# Patient Record
Sex: Female | Born: 1945
Health system: Southern US, Community
[De-identification: ages and names within clinical notes are randomized; demographics above are authoritative.]

## PROBLEM LIST (undated history)

## (undated) DIAGNOSIS — I509 Heart failure, unspecified: Secondary | ICD-10-CM

## (undated) DIAGNOSIS — F0390 Unspecified dementia without behavioral disturbance: Secondary | ICD-10-CM

## (undated) DIAGNOSIS — I10 Essential (primary) hypertension: Secondary | ICD-10-CM

## (undated) HISTORY — DX: Essential (primary) hypertension: I10

---

## 2020-05-13 DIAGNOSIS — I639 Cerebral infarction, unspecified: Secondary | ICD-10-CM

## 2020-05-13 HISTORY — DX: Cerebral infarction, unspecified: I63.9

## 2020-05-14 ENCOUNTER — Encounter (HOSPITAL_COMMUNITY): Payer: Self-pay

## 2020-05-14 ENCOUNTER — Inpatient Hospital Stay (HOSPITAL_COMMUNITY)
Admission: EM | Admit: 2020-05-14 | Discharge: 2020-05-18 | DRG: 064 | Disposition: A | Discharge status: Skilled Nursing Facility | Payer: Medicare Other | Attending: Internal Medicine | Admitting: Internal Medicine

## 2020-05-14 ENCOUNTER — Emergency Department (HOSPITAL_COMMUNITY): Payer: Medicare Other

## 2020-05-14 ENCOUNTER — Other Ambulatory Visit: Payer: Self-pay

## 2020-05-14 ENCOUNTER — Inpatient Hospital Stay (HOSPITAL_COMMUNITY): Payer: Medicare Other

## 2020-05-14 DIAGNOSIS — N3000 Acute cystitis without hematuria: Secondary | ICD-10-CM

## 2020-05-14 DIAGNOSIS — I160 Hypertensive urgency: Secondary | ICD-10-CM | POA: Diagnosis present

## 2020-05-14 DIAGNOSIS — I639 Cerebral infarction, unspecified: Secondary | ICD-10-CM | POA: Diagnosis present

## 2020-05-14 DIAGNOSIS — Z6829 Body mass index (BMI) 29.0-29.9, adult: Secondary | ICD-10-CM | POA: Diagnosis not present

## 2020-05-14 DIAGNOSIS — E538 Deficiency of other specified B group vitamins: Secondary | ICD-10-CM | POA: Diagnosis present

## 2020-05-14 DIAGNOSIS — R29708 NIHSS score 8: Secondary | ICD-10-CM | POA: Diagnosis present

## 2020-05-14 DIAGNOSIS — R471 Dysarthria and anarthria: Secondary | ICD-10-CM | POA: Diagnosis present

## 2020-05-14 DIAGNOSIS — R627 Adult failure to thrive: Secondary | ICD-10-CM | POA: Diagnosis not present

## 2020-05-14 DIAGNOSIS — I739 Peripheral vascular disease, unspecified: Secondary | ICD-10-CM | POA: Diagnosis present

## 2020-05-14 DIAGNOSIS — N3001 Acute cystitis with hematuria: Secondary | ICD-10-CM | POA: Diagnosis present

## 2020-05-14 DIAGNOSIS — Z20822 Contact with and (suspected) exposure to covid-19: Secondary | ICD-10-CM | POA: Diagnosis present

## 2020-05-14 DIAGNOSIS — R4182 Altered mental status, unspecified: Secondary | ICD-10-CM | POA: Diagnosis not present

## 2020-05-14 DIAGNOSIS — D72829 Elevated white blood cell count, unspecified: Secondary | ICD-10-CM | POA: Diagnosis present

## 2020-05-14 DIAGNOSIS — D72825 Bandemia: Secondary | ICD-10-CM | POA: Diagnosis not present

## 2020-05-14 DIAGNOSIS — B9620 Unspecified Escherichia coli [E. coli] as the cause of diseases classified elsewhere: Secondary | ICD-10-CM | POA: Diagnosis present

## 2020-05-14 DIAGNOSIS — E118 Type 2 diabetes mellitus with unspecified complications: Secondary | ICD-10-CM | POA: Diagnosis present

## 2020-05-14 DIAGNOSIS — F17210 Nicotine dependence, cigarettes, uncomplicated: Secondary | ICD-10-CM | POA: Diagnosis present

## 2020-05-14 DIAGNOSIS — G9341 Metabolic encephalopathy: Secondary | ICD-10-CM | POA: Diagnosis present

## 2020-05-14 DIAGNOSIS — E785 Hyperlipidemia, unspecified: Secondary | ICD-10-CM | POA: Diagnosis present

## 2020-05-14 DIAGNOSIS — I6340 Cerebral infarction due to embolism of unspecified cerebral artery: Secondary | ICD-10-CM | POA: Diagnosis not present

## 2020-05-14 DIAGNOSIS — F0150 Vascular dementia without behavioral disturbance: Secondary | ICD-10-CM | POA: Diagnosis present

## 2020-05-14 DIAGNOSIS — N179 Acute kidney failure, unspecified: Secondary | ICD-10-CM | POA: Diagnosis present

## 2020-05-14 DIAGNOSIS — I6381 Other cerebral infarction due to occlusion or stenosis of small artery: Principal | ICD-10-CM | POA: Diagnosis present

## 2020-05-14 DIAGNOSIS — I351 Nonrheumatic aortic (valve) insufficiency: Secondary | ICD-10-CM | POA: Diagnosis not present

## 2020-05-14 DIAGNOSIS — N390 Urinary tract infection, site not specified: Secondary | ICD-10-CM | POA: Diagnosis present

## 2020-05-14 LAB — COMPREHENSIVE METABOLIC PANEL
ALT: 13 U/L (ref 0–44)
AST: 25 U/L (ref 15–41)
Albumin: 3.7 g/dL (ref 3.5–5.0)
Alkaline Phosphatase: 114 U/L (ref 38–126)
Anion gap: 9 (ref 5–15)
BUN: 17 mg/dL (ref 8–23)
CO2: 24 mmol/L (ref 22–32)
Calcium: 8.5 mg/dL — ABNORMAL LOW (ref 8.9–10.3)
Chloride: 110 mmol/L (ref 98–111)
Creatinine, Ser: 1.17 mg/dL — ABNORMAL HIGH (ref 0.44–1.00)
GFR calc Af Amer: 53 mL/min — ABNORMAL LOW (ref >60)
GFR calc non Af Amer: 46 mL/min — ABNORMAL LOW (ref >60)
Glucose, Bld: 137 mg/dL — ABNORMAL HIGH (ref 70–99)
Potassium: 3.9 mmol/L (ref 3.5–5.1)
Sodium: 143 mmol/L (ref 135–145)
Total Bilirubin: 0.6 mg/dL (ref 0.3–1.2)
Total Protein: 7 g/dL (ref 6.5–8.1)

## 2020-05-14 LAB — CBC WITH DIFFERENTIAL/PLATELET
Abs Immature Granulocytes: 0.04 10*3/uL (ref 0.00–0.07)
Basophils Absolute: 0.1 10*3/uL (ref 0.0–0.1)
Basophils Relative: 0 %
Eosinophils Absolute: 0 10*3/uL (ref 0.0–0.5)
Eosinophils Relative: 0 %
HCT: 41.2 % (ref 36.0–46.0)
Hemoglobin: 13.5 g/dL (ref 12.0–15.0)
Immature Granulocytes: 0 %
Lymphocytes Relative: 8 %
Lymphs Abs: 1 10*3/uL (ref 0.7–4.0)
MCH: 31.7 pg (ref 26.0–34.0)
MCHC: 32.8 g/dL (ref 30.0–36.0)
MCV: 96.7 fL (ref 80.0–100.0)
Monocytes Absolute: 0.6 10*3/uL (ref 0.1–1.0)
Monocytes Relative: 5 %
Neutro Abs: 10.5 10*3/uL — ABNORMAL HIGH (ref 1.7–7.7)
Neutrophils Relative %: 87 %
Platelets: 269 10*3/uL (ref 150–400)
RBC: 4.26 MIL/uL (ref 3.87–5.11)
RDW: 13 % (ref 11.5–15.5)
WBC: 12.1 10*3/uL — ABNORMAL HIGH (ref 4.0–10.5)
nRBC: 0 % (ref 0.0–0.2)

## 2020-05-14 LAB — SARS CORONAVIRUS 2 BY RT PCR (HOSPITAL ORDER, PERFORMED IN ~~LOC~~ HOSPITAL LAB): SARS Coronavirus 2: NEGATIVE

## 2020-05-14 LAB — URINALYSIS, ROUTINE W REFLEX MICROSCOPIC
Bilirubin Urine: NEGATIVE
Glucose, UA: NEGATIVE mg/dL
Ketones, ur: 20 mg/dL — AB
Nitrite: NEGATIVE
Protein, ur: 100 mg/dL — AB
Specific Gravity, Urine: 1.01 (ref 1.005–1.030)
WBC, UA: >50 WBC/hpf — ABNORMAL HIGH (ref 0–5)
pH: 6 (ref 5.0–8.0)

## 2020-05-14 LAB — TSH: TSH: 2.739 u[IU]/mL (ref 0.350–4.500)

## 2020-05-14 IMAGING — DX DG CHEST 1V PORT
1 series · 1 of 1 positions shown · non-contrast
Comparison: None.

CLINICAL DATA: Weakness.  Confusion.  Cough.

EXAM:
PORTABLE CHEST 1 VIEW

[chest ap]
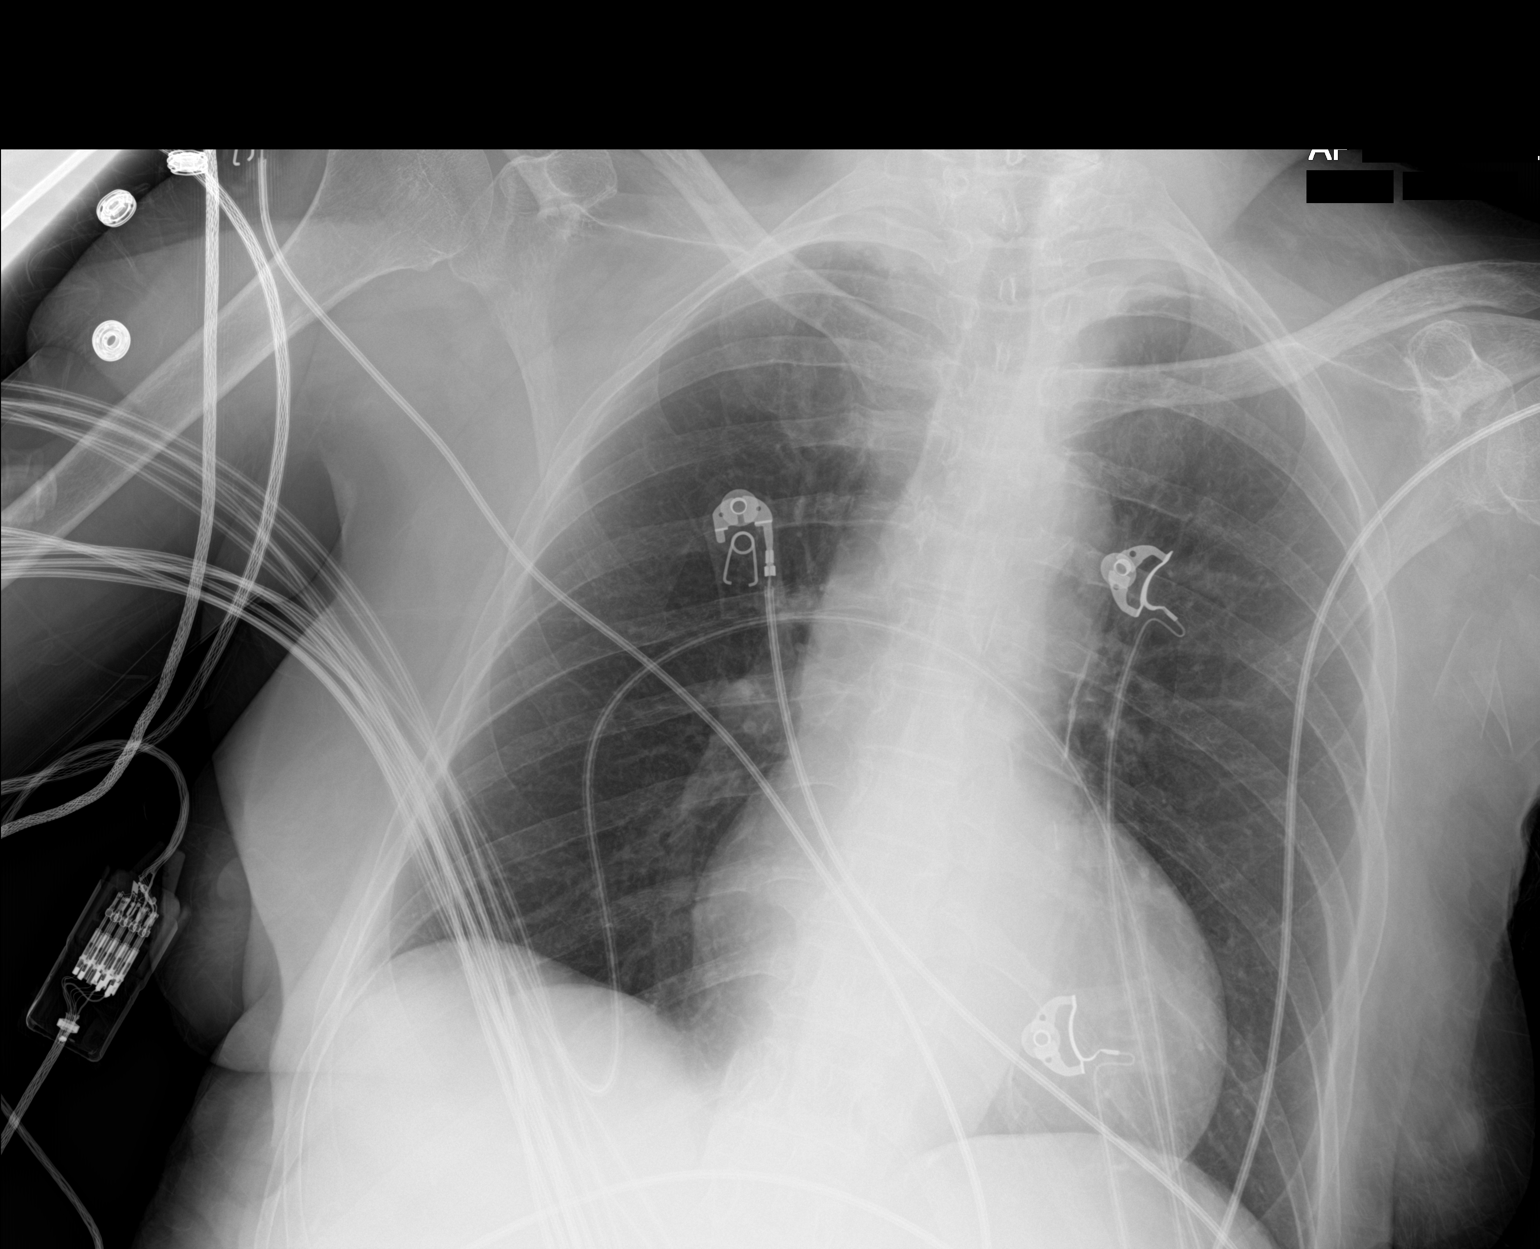

[1 of 1 positions shown; findings below may reference images not displayed]

FINDINGS: Numerous leads and wires project over the chest. Midline trachea.
Borderline cardiomegaly. Aortic atherosclerosis. Left costophrenic
angle minimally excluded. No pleural effusion or pneumothorax. Clear
lungs.
IMPRESSION: No acute cardiopulmonary disease.

## 2020-05-14 IMAGING — MR MR HEAD W/O CM
2 of 6 series · 25 of 48 positions shown · non-contrast
Comparison: None.

CLINICAL DATA: Weakness and decreased mobility

EXAM:
MRI HEAD WITHOUT CONTRAST
TECHNIQUE: Multiplanar, multiecho pulse sequences of the brain and surrounding
structures were obtained without intravenous contrast.

[Series 5: DWI · axial · 3.0mm · 1.36mm/px · z∈[-39,+98]mm · 17 of 100 slices shown (1 of 2)]
[im 1/100]
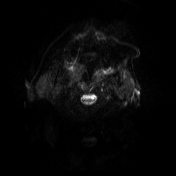
[im 7/100]
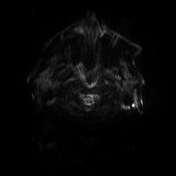
[im 13/100]
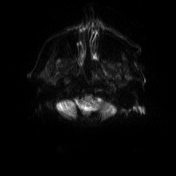
[im 19/100]
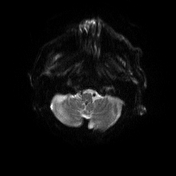
[im 25/100]
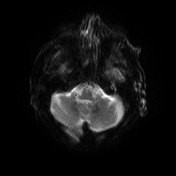
[im 31/100]
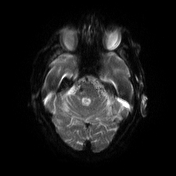
[im 38/100]
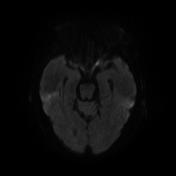
[im 44/100]
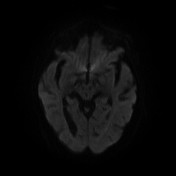
[im 50/100]
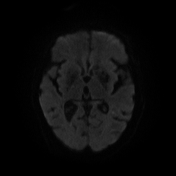
[im 56/100]
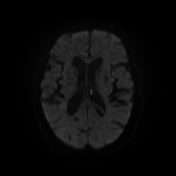
[im 62/100]
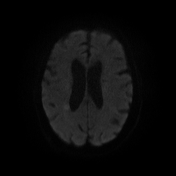
[im 69/100]
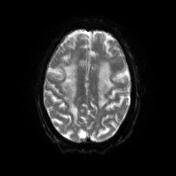
[im 75/100]
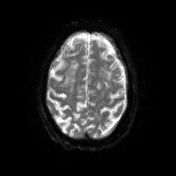
[im 81/100]
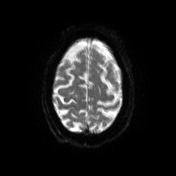
[im 87/100]
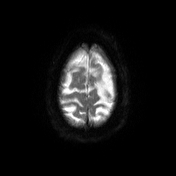
[im 93/100]
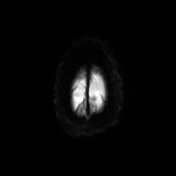
[im 100/100]
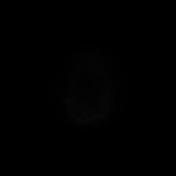

[Series 6: DWI · axial · 3.0mm · 1.36mm/px · z∈[-39,+98]mm · 8 of 50 slices shown (2 of 2)]
[im 1/50]
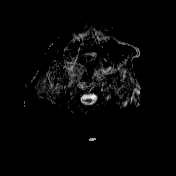
[im 8/50]
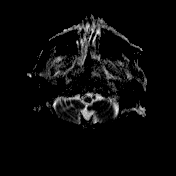
[im 15/50]
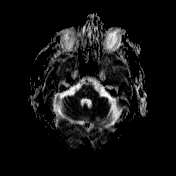
[im 22/50]
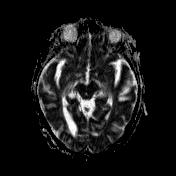
[im 29/50]
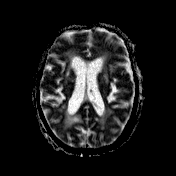
[im 36/50]
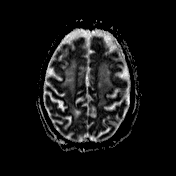
[im 43/50]
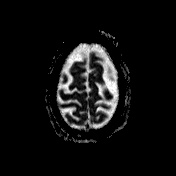
[im 50/50]
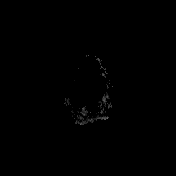

[25 of 48 positions shown; findings below may reference images not displayed]

FINDINGS: The examination was discontinued prior to completion due to patient
altered mental status and inability to cooperate with the
technologist's instructions. Axial diffusion-weighted imaging only
was obtained.

There are multiple small areas of hyperintensity on
diffusion-weighted imaging, including the right frontal white
matter, right periatrial white matter and left temporal lobe. No
midline shift or other mass effect.
IMPRESSION: 1. Truncated examination due to patient altered mental status and
inability to cooperate with the technologist's instructions.
2. Multiple small areas of abnormal diffusivity within the right
frontal white matter, right periatrial white matter and left
temporal lobe. These are most likely small infarcts, possibly from
an embolic source.

## 2020-05-14 IMAGING — CT CT HEAD W/O CM
3 series · 15 of 47 positions shown, 18 images · non-contrast
Comparison: No pertinent prior exams are available for comparison.

CLINICAL DATA: Delirium. Additional provided: Increasing
generalized weakness and reduced mobility for several months

EXAM:
CT HEAD WITHOUT CONTRAST
TECHNIQUE: Contiguous axial images were obtained from the base of the skull
through the vertex without intravenous contrast.

[Series 2: head wo · axial · 0.39mm/px · z∈[-148,-13]mm · 9 of 33 slices shown, 12 images]
[im 3/33  brain]
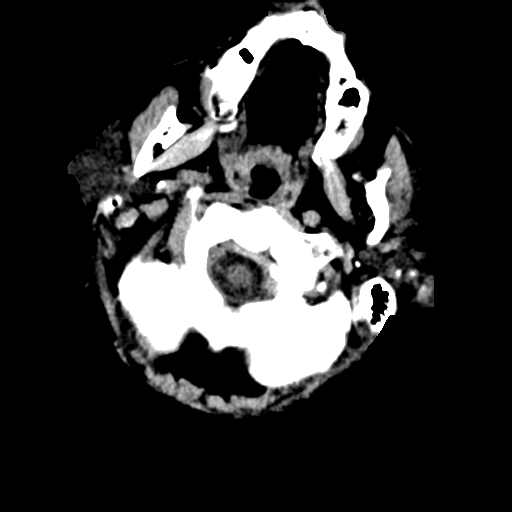
[im 3/33  bone]
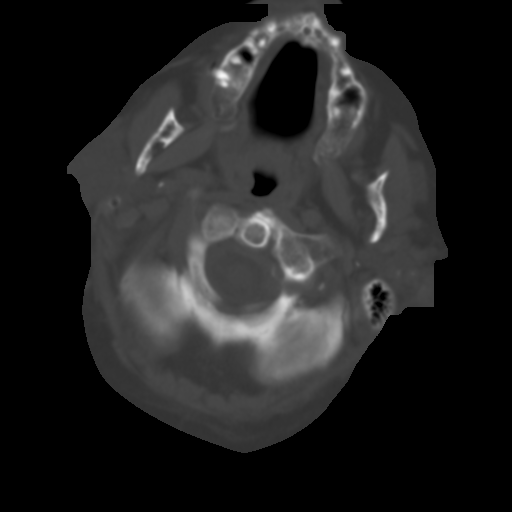
[im 6/33  brain]
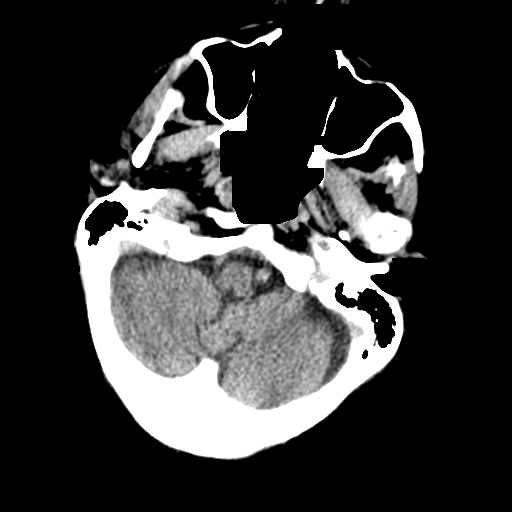
[im 9/33  brain]
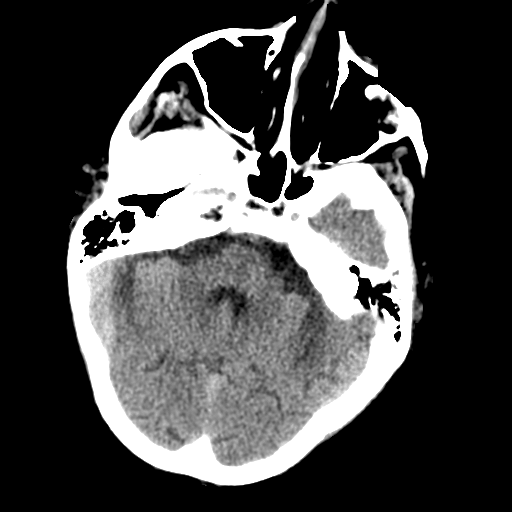
[im 13/33  brain]
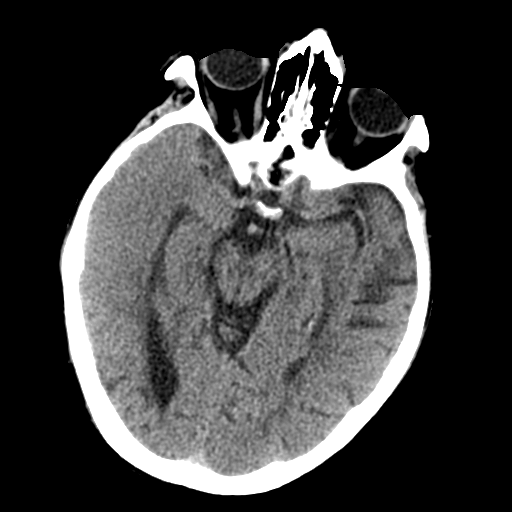
[im 17/33  brain]
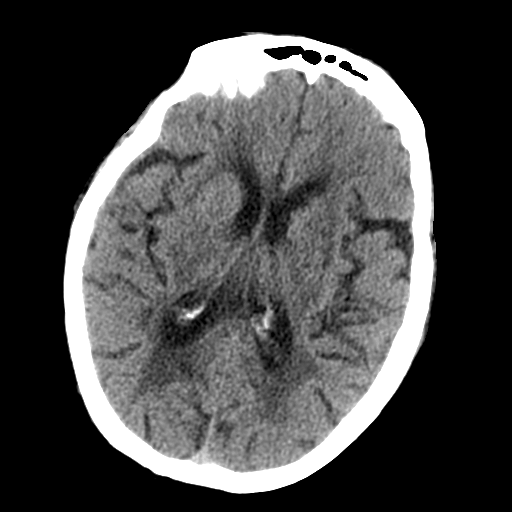
[im 17/33  bone]
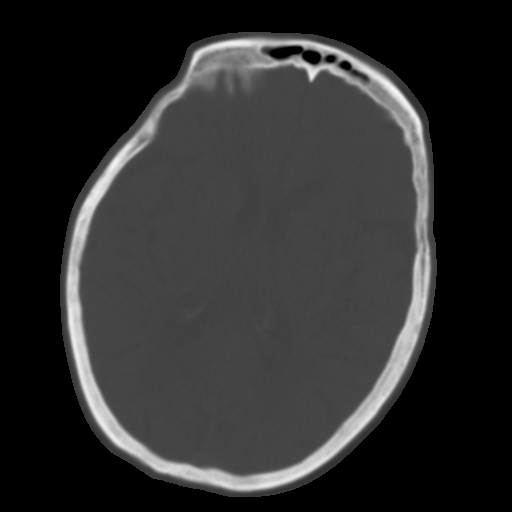
[im 20/33  brain]
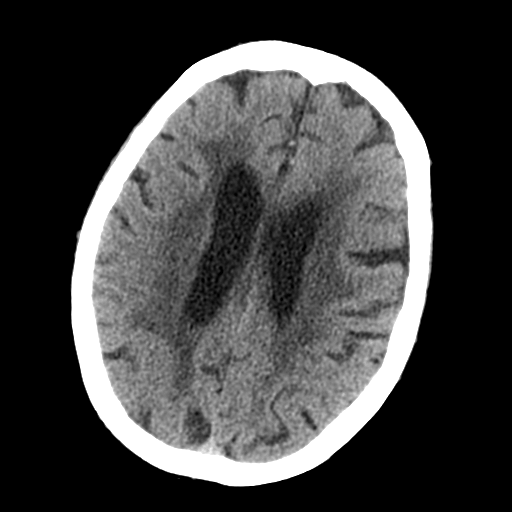
[im 24/33  brain]
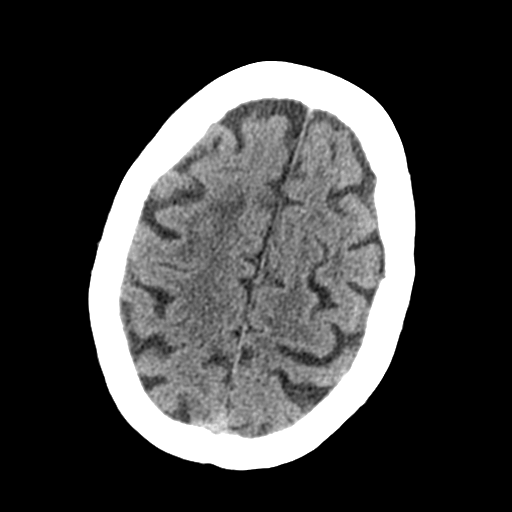
[im 27/33  brain]
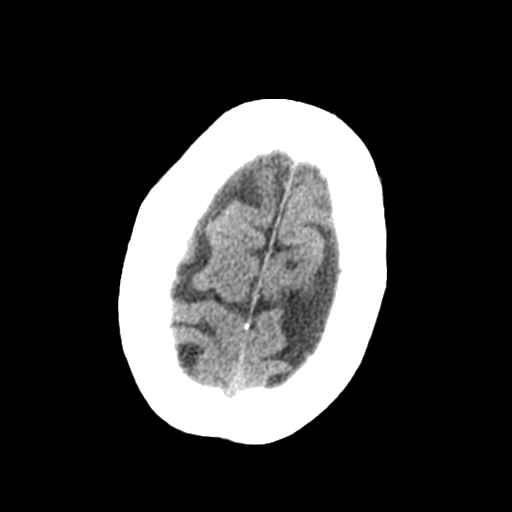
[im 30/33  brain]
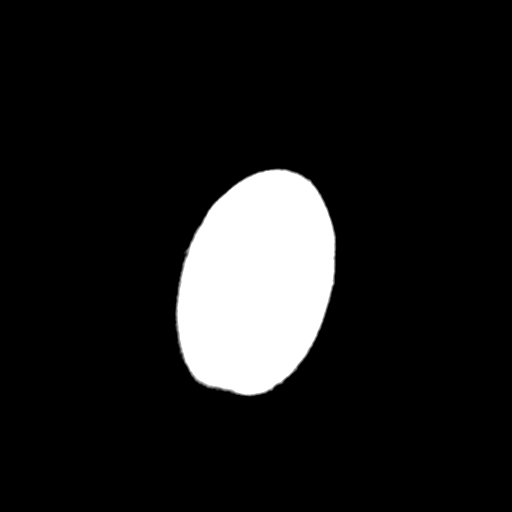
[im 30/33  bone]
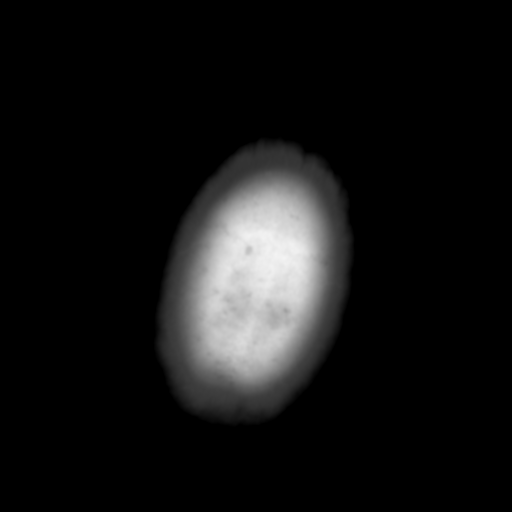

[Series 5: coronal soft tissue · coronal · 0.29mm/px · 3 of 68 slices shown]
[im 26/68  brain]
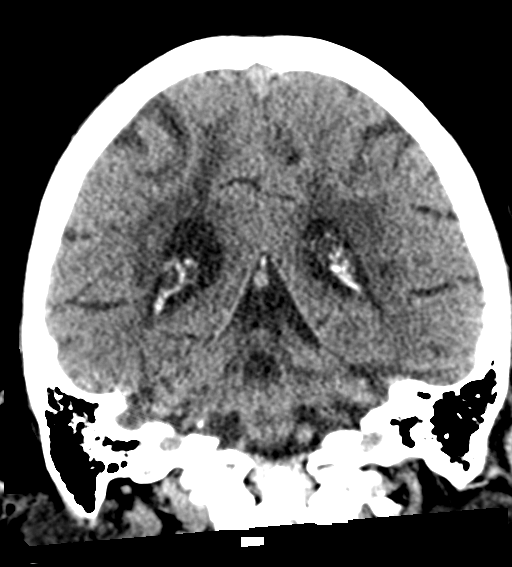
[im 31/68  brain]
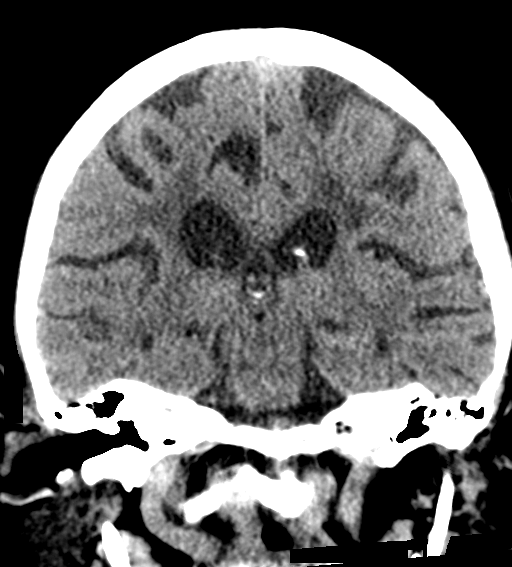
[im 37/68  brain]
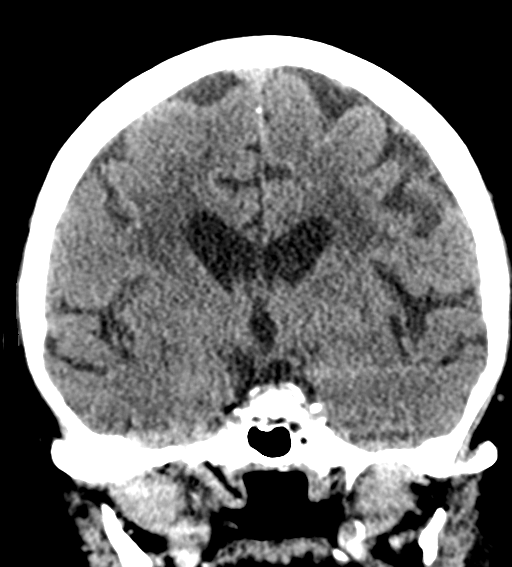

[Series 6: sagittal soft tissue · sagittal · 0.32mm/px · 3 of 50 slices shown]
[im 17/50  brain]
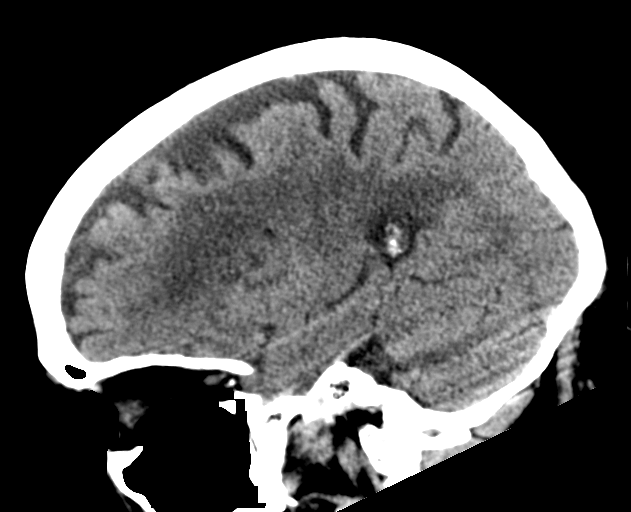
[im 25/50  brain]
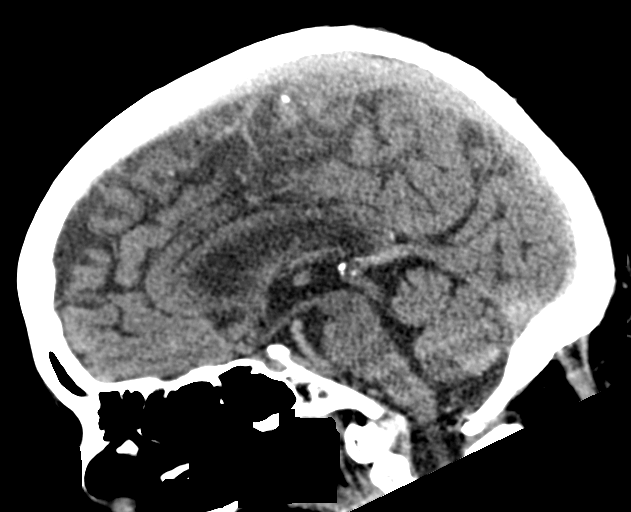
[im 33/50  brain]
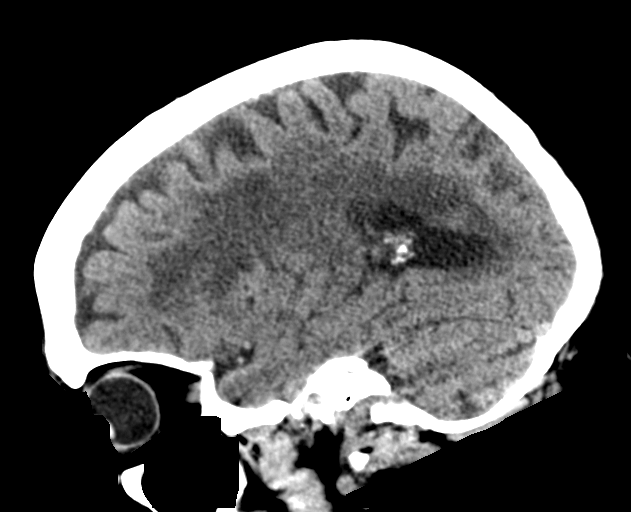

[15 of 47 positions shown; findings below may reference images not displayed]

FINDINGS: Brain:

Moderate generalized parenchymal atrophy.

Advanced ill-defined hypoattenuation within the cerebral white
matter is nonspecific, but consistent with chronic small vessel
ischemic disease.

There are multiple lacunar infarcts within the bilateral basal
ganglia, some of which are clearly chronic and some of which are
small and age-indeterminate. Small chronic appearing lacunar infarct
within the right thalamus. Small chronic appearing infarct within
the right cerebellum.

Small chronic cortically based infarct within the paramedian right
parietal lobe.

No demarcated cortical infarct.

No extra-axial fluid collection.

No evidence of intracranial mass.

No midline shift.

Vascular: No hyperdense vessel.  Atherosclerotic calcifications

Skull: Normal. Negative for fracture or focal lesion.

Sinuses/Orbits: Visualized orbits show no acute finding. Trace
ethmoid sinus mucosal thickening. No significant mastoid effusion
IMPRESSION: No evidence of acute intracranial hemorrhage or acute demarcated
cortical infarct.

There are multiple lacunar infarcts within the bilateral basal
ganglia, some of which are clearly chronic and some of which are
small and age-indeterminate.

Small chronic appearing infarcts are also present within the right
thalamus and right cerebellar hemisphere.

Background moderate generalized parenchymal atrophy and advanced
cerebral white matter chronic small vessel ischemic disease.

Small chronic cortically based infarct within the paramedian right
parietal lobe.

Minimal ethmoid sinus mucosal thickening.

## 2020-05-14 MED ORDER — LABETALOL HCL 5 MG/ML IV SOLN
5.0000 mg | INTRAVENOUS | Status: DC | PRN
  Administered 2020-05-14 – 2020-05-17 (×2): 5 mg via INTRAVENOUS
  Filled 2020-05-14 (×2): qty 4

## 2020-05-14 MED ORDER — ENOXAPARIN SODIUM 40 MG/0.4ML ~~LOC~~ SOLN
40.0000 mg | SUBCUTANEOUS | Status: DC
  Administered 2020-05-14 – 2020-05-17 (×4): 40 mg via SUBCUTANEOUS
  Filled 2020-05-14 (×4): qty 0.4

## 2020-05-14 MED ORDER — DEXTROSE-NACL 5-0.45 % IV SOLN: INTRAVENOUS | Status: DC

## 2020-05-14 MED ORDER — SODIUM CHLORIDE 0.9 % IV SOLN
1.0000 g | INTRAVENOUS | Status: DC
  Administered 2020-05-15 – 2020-05-16 (×2): 1 g via INTRAVENOUS
  Filled 2020-05-14 (×2): qty 10

## 2020-05-14 MED ORDER — CLONIDINE HCL 0.1 MG PO TABS
0.1000 mg | ORAL_TABLET | Freq: Three times a day (TID) | ORAL | Status: DC
  Administered 2020-05-14 – 2020-05-18 (×11): 0.1 mg via ORAL
  Filled 2020-05-14 (×11): qty 1

## 2020-05-14 MED ORDER — SODIUM CHLORIDE 0.9 % IV SOLN
1.0000 g | Freq: Once | INTRAVENOUS | Status: AC
  Administered 2020-05-14: 1 g via INTRAVENOUS
  Filled 2020-05-14: qty 10

## 2020-05-14 MED ORDER — ACETAMINOPHEN 325 MG PO TABS: 650.0000 mg | ORAL_TABLET | Freq: Four times a day (QID) | ORAL | Status: DC | PRN

## 2020-05-14 MED ORDER — ONDANSETRON HCL 4 MG PO TABS: 4.0000 mg | ORAL_TABLET | Freq: Four times a day (QID) | ORAL | Status: DC | PRN

## 2020-05-14 MED ORDER — ACETAMINOPHEN 650 MG RE SUPP: 650.0000 mg | Freq: Four times a day (QID) | RECTAL | Status: DC | PRN

## 2020-05-14 MED ORDER — ONDANSETRON HCL 4 MG/2ML IJ SOLN: 4.0000 mg | Freq: Four times a day (QID) | INTRAMUSCULAR | Status: DC | PRN

## 2020-05-14 NOTE — ED Triage Notes (Signed)
EMS reports from home, family called out for increasing generalized weakness and reduced mobility x several months. Has not seen any provider in 25 years per family. Pt denies pain or distress. Family states Pt is non compliant with assisted ADLs.  BP 220/108 HR 92 RR 18 Sp02 96 RA CBG 161 Temp 97.4  20 LAC

## 2020-05-14 NOTE — ED Provider Notes (Signed)
[facility] DEPT Provider Note   CSN: [id number] Arrival date & time: 05/14/20  1512     History Chief Complaint  Patient presents with  . Weakness  . Failure To Thrive    Melinda Olson is a 74 y.o. female with no pertinent past medical history that presents via EMS for weakness and failure to thrive.  Per EMS family called due to generalized weakness and reduced mobility for at least 7 months now.  Has not seen provider in over 25 years.  When speaking to patient she states that she is not sure why she is here, states that she came because her husband made her.  Denies any pain anywhere.  Denies any medications that she takes daily.  States that she feels fine and is ready to go home.  Denies any dysuria, chest pain, shortness of breath, vomiting, nausea, vomiting, diarrhea.  Patient states that she is been eating and drinking going to the bathroom normally.  States that she lives at home with her husband who takes care of her.  No medical history in the record, unable to access care everywhere.  Was able to speak to son and husband, they state that she has been declining for several months now however for the past 3 days she has had been unable to walk.  Normally uses a walker at home, however for the past 3 days has not been able to use it.  No falls, seizure-like activity, neglect, facial droop or drooling.  Worsening confusion over the Past week.  HPI     History reviewed. No pertinent past medical history.  There are no problems to display for this patient.   History reviewed. No pertinent surgical history.   OB History   No obstetric history on file.     History reviewed. No pertinent family history.  Social History   Tobacco Use  . Smoking status: Heavy Tobacco Smoker  . Smokeless tobacco: Never Used  Substance Use Topics  . Alcohol use: Not on file  . Drug use: Not on file    Home Medications Prior to Admission medications   Not  on File    Allergies    Patient has no known allergies.  Review of Systems   Review of Systems  Constitutional: Negative for chills, diaphoresis, fatigue and fever.  HENT: Negative for congestion, sore throat and trouble swallowing.   Eyes: Negative for pain and visual disturbance.  Respiratory: Negative for cough, shortness of breath and wheezing.   Cardiovascular: Negative for chest pain, palpitations and leg swelling.  Gastrointestinal: Negative for abdominal distention, abdominal pain, diarrhea, nausea and vomiting.  Genitourinary: Negative for difficulty urinating.  Musculoskeletal: Negative for back pain, neck pain and neck stiffness.  Skin: Negative for pallor.  Neurological: Positive for weakness. Negative for dizziness, speech difficulty and headaches.  Psychiatric/Behavioral: Negative for confusion.    Physical Exam Updated Vital Signs BP (!) 202/90   Pulse 95   Temp 98.5 F (36.9 C) (Oral)   Resp 16   SpO2 98%   Physical Exam Constitutional:      General: She is not in acute distress.    Appearance: Normal appearance. She is not ill-appearing, toxic-appearing or diaphoretic.     Comments: When patient arrived patient was soiled, appears as if she has been soiled for multiple hours/days.  HENT:     Head: Normocephalic and atraumatic.     Mouth/Throat:     Mouth: Mucous membranes are moist.  Pharynx: Oropharynx is clear.  Eyes:     General: No scleral icterus.    Extraocular Movements: Extraocular movements intact.     Pupils: Pupils are equal, round, and reactive to light.  Cardiovascular:     Rate and Rhythm: Normal rate and regular rhythm.     Pulses: Normal pulses.     Heart sounds: Normal heart sounds.  Pulmonary:     Effort: Pulmonary effort is normal. No respiratory distress.     Breath sounds: Normal breath sounds. No stridor. No wheezing, rhonchi or rales.  Chest:     Chest wall: No tenderness.  Abdominal:     General: Abdomen is flat. There  is no distension.     Palpations: Abdomen is soft.     Tenderness: There is no abdominal tenderness. There is no guarding or rebound.  Musculoskeletal:        General: No swelling or tenderness. Normal range of motion.     Cervical back: Normal range of motion and neck supple. No rigidity or tenderness.     Right lower leg: No edema.     Left lower leg: No edema.     Comments: Normal strength to upper and lower extremities, will not answer me about sensation.  Radial pulse 2+ and equal.  PT pulses 2+.  Skin:    General: Skin is warm and dry.     Capillary Refill: Capillary refill takes less than 2 seconds.     Coloration: Skin is not jaundiced or pale.     Findings: No bruising.  Neurological:     General: No focal deficit present.     Mental Status: She is alert and oriented to person, place, and time.     Comments: Will follow some commands, is delayed.  Alert to self, not alert to place, time, situation.. Clear speech. No facial droop. CNIII-XII grossly intact. Bilateral upper and lower extremities' sensation grossly intact. 5/5 symmetric strength with grip strength and with plantar and dorsi flexion bilaterally.   Patient will answer some questions, however will not answer other questions.  Appears confused at times, however will follow commands and answer most questions.   Psychiatric:        Mood and Affect: Mood normal.        Behavior: Behavior normal.     ED Results / Procedures / Treatments   Labs (all labs ordered are listed, but only abnormal results are displayed) Labs Reviewed  URINALYSIS, ROUTINE W REFLEX MICROSCOPIC - Abnormal; Notable for the following components:      Result Value   Color, Urine STRAW (*)    APPearance TURBID (*)    Hgb urine dipstick SMALL (*)    Ketones, ur 20 (*)    Protein, ur 100 (*)    Leukocytes,Ua LARGE (*)    WBC, UA >50 (*)    Bacteria, UA MANY (*)    Non Squamous Epithelial 6-10 (*)    All other components within normal limits    COMPREHENSIVE METABOLIC PANEL - Abnormal; Notable for the following components:   Glucose, Bld 137 (*)    Creatinine, Ser 1.17 (*)    Calcium 8.5 (*)    GFR calc non Af Amer 46 (*)    GFR calc Af Amer 53 (*)    All other components within normal limits  CBC WITH DIFFERENTIAL/PLATELET - Abnormal; Notable for the following components:   WBC 12.1 (*)    Neutro Abs 10.5 (*)    All other  components within normal limits  URINE CULTURE  TSH    EKG None  Radiology CT Head Wo Contrast  Result Date: 05/14/2020 CLINICAL DATA:  Delirium. Additional provided: Increasing generalized weakness and reduced mobility for several months EXAM: CT HEAD WITHOUT CONTRAST TECHNIQUE: Contiguous axial images were obtained from the base of the skull through the vertex without intravenous contrast. COMPARISON:  No pertinent prior exams are available for comparison. FINDINGS: Brain: Moderate generalized parenchymal atrophy. Advanced ill-defined hypoattenuation within the cerebral white matter is nonspecific, but consistent with chronic small vessel ischemic disease. There are multiple lacunar infarcts within the bilateral basal ganglia, some of which are clearly chronic and some of which are small and age-indeterminate. Small chronic appearing lacunar infarct within the right thalamus. Small chronic appearing infarct within the right cerebellum. Small chronic cortically based infarct within the paramedian right parietal lobe. No demarcated cortical infarct. No extra-axial fluid collection. No evidence of intracranial mass. No midline shift. Vascular: No hyperdense vessel.  Atherosclerotic calcifications Skull: Normal. Negative for fracture or focal lesion. Sinuses/Orbits: Visualized orbits show no acute finding. Trace ethmoid sinus mucosal thickening. No significant mastoid effusion IMPRESSION: No evidence of acute intracranial hemorrhage or acute demarcated cortical infarct. There are multiple lacunar infarcts within the  bilateral basal ganglia, some of which are clearly chronic and some of which are small and age-indeterminate. Small chronic appearing infarcts are also present within the right thalamus and right cerebellar hemisphere. Background moderate generalized parenchymal atrophy and advanced cerebral white matter chronic small vessel ischemic disease. Small chronic cortically based infarct within the paramedian right parietal lobe. Minimal ethmoid sinus mucosal thickening. Electronically Signed   By: Kellie Simmering DO   On: 05/14/2020 17:32   DG Chest Port 1 View  Result Date: 05/14/2020 CLINICAL DATA:  Weakness.  Confusion.  Cough. EXAM: PORTABLE CHEST 1 VIEW COMPARISON:  None. FINDINGS: Numerous leads and wires project over the chest. Midline trachea. Borderline cardiomegaly. Aortic atherosclerosis. Left costophrenic angle minimally excluded. No pleural effusion or pneumothorax. Clear lungs. IMPRESSION: No acute cardiopulmonary disease. Electronically Signed   By: Abigail Miyamoto M.D.   On: 05/14/2020 17:24    Procedures Procedures (including critical care time)  Medications Ordered in ED Medications  cefTRIAXone (ROCEPHIN) 1 g in sodium chloride 0.9 % 100 mL IVPB (has no administration in time range)    ED Course  I have reviewed the triage vital signs and the nursing notes.  Pertinent labs & imaging results that were available during my care of the patient were reviewed by me and considered in my medical decision making (see chart for details).    MDM Rules/Calculators/A&P                         Aiya Keach is a 74 y.o. female with no pertinent past medical history that presents via EMS for weakness and failure to thrive.   Their exam with confusion, only alert to self.  Globally weak.  Unable to assess gait due to weakness.  Will obtain normal labs, CT head, and urine.  CBC and CMP unremarkable.  CT head with chronic and indeterminate age lacunar infarcts, since patient has been unable to walk  for the past 3 days with age indeterminate infarcts will consult hospitalist this time for stroke work-up.  719 spoke to neurology, Dr. Leonel Ramsay who does not recomend urgent transfer to [facility], recommends MRI.  UA came back positive for UTI.  Will treat at this time.  Spoke to Dr. Jonelle Sidle who will accept patient.  This could most likely explain patient's confusion.  The patient appears reasonably stabilized for admission considering the current resources, flow, and capabilities available in the ED at this time, and I doubt any other [facility] requiring further screening and/or treatment in the ED prior to admission.  I discussed this case with my attending physician who cosigned this note including patient's presenting symptoms, physical exam, and planned diagnostics and interventions. Attending physician stated agreement with plan or made changes to plan which were implemented.   Attending physician assessed patient at bedside.  Final Clinical Impression(s) / ED Diagnoses Final diagnoses:  Failure to thrive in adult  Acute cystitis with hematuria    Rx / DC Orders ED Discharge Orders    None       Alfredia Client, PA-C 05/14/20 Malachi Carl, MD 05/14/20 [id number]

## 2020-05-14 NOTE — H&P (Signed)
History and Physical   Melinda Olson ZOX:[id number] DOB: Jul 28, 1946 DOA: 05/14/2020  Referring MD/NP/PA: Dr. Alvino Chapel  PCP: Patient, No Pcp Per   Outpatient Specialists: None  Patient coming from: Home  Chief Complaint: Generalized weakness and failure to thrive  HPI: Melinda Olson is a 74 y.o. female with medical history significant of no significant past medical history who was brought in by EMS due to generalized weakness over the last 3 days.  Patient apparently has been declining in the last few months but got worse in the last 3 days to where she could not get out of bed.  She was previously seen a doctor over 4 years has not.  No documented medical issues.  Patient was observed to have systolic blood pressure in the 200s in the ER.  Initial CT suggested possible infarct and urinalysis indicated UTI.  She was noted to have her decline in her cognition also.  She was not coherent.  Family were worried that is why they brought her to the ER.  She was reviewed by neurology who thinks this is not a stroke.  Recommends treat treatment and evaluation for metabolic encephalopathy.  At this point she more than likely has UTI..  ED Course: Temperature is 98.5 blood pressure 223/88 pulse 110 respiratory 22 oxygen sats 96% on room air.  Urinalysis showed turbid urine with large leukocyte WBC more than 50 RBC 0-5 many bacteria.  Urine cultures obtained.  Head CT without contrast showed no evidence of acute intracranial hemorrhage multiple lacunar infarct with the bilateral basal ganglia mostly of them chronic.  Small chronic infarct within the paramedian right parietal lobe.  Recommendations for MRI done.  Chemistry showed glucose of 137 otherwise uneventful White count 12.1 the rest of the CBC within normal.  Patient is being admitted for further evaluation and treatment.  Review of Systems: As per HPI otherwise 10 point review of systems negative.    History reviewed. No pertinent past medical  history.  History reviewed. No pertinent surgical history.   reports that she has been smoking cigarettes. She has never used smokeless tobacco. She reports that she does not drink alcohol and does not use drugs.  No Known Allergies  History reviewed. No pertinent family history.   Prior to Admission medications   Not on File    Physical Exam: Vitals:   05/14/20 1547 05/14/20 1809 05/14/20 1900 05/14/20 2000  BP: (!) 153/106 (!) 213/99 (!) 202/90 (!) 219/94  Pulse: 90 (!) 102 95 96  Resp: 17 17 16  (!) 22  Temp: 98.5 F (36.9 C)     TempSrc: Oral     SpO2: 98% 98% 98% 98%      Constitutional: Confused, laying in bed, no distress Vitals:   05/14/20 1547 05/14/20 1809 05/14/20 1900 05/14/20 2000  BP: (!) 153/106 (!) 213/99 (!) 202/90 (!) 219/94  Pulse: 90 (!) 102 95 96  Resp: 17 17 16  (!) 22  Temp: 98.5 F (36.9 C)     TempSrc: Oral     SpO2: 98% 98% 98% 98%   Eyes: PERRL, lids and conjunctivae normal ENMT: Mucous membranes are moist. Posterior pharynx clear of any exudate or lesions.Normal dentition.  Neck: normal, supple, no masses, no thyromegaly Respiratory: clear to auscultation bilaterally, no wheezing, no crackles. Normal respiratory effort. No accessory muscle use.  Cardiovascular: Regular rate and rhythm, no murmurs / rubs / gallops. No extremity edema. 2+ pedal pulses. No carotid bruits.  Abdomen: no tenderness, no masses palpated. No  hepatosplenomegaly. Bowel sounds positive.  Musculoskeletal: no clubbing / cyanosis. No joint deformity upper and lower extremities. Good ROM, no contractures. Normal muscle tone.  Skin: no rashes, lesions, ulcers. No induration Neurologic: CN 2-12 grossly intact. Sensation intact, DTR normal. Strength 5/5 in all 4.  Psychiatric: Confused, disoriented in place person and time    Labs on Admission: I have personally reviewed following labs and imaging studies  CBC: Recent Labs  Lab 05/14/20 1605  WBC 12.1*  NEUTROABS  10.5*  HGB 13.5  HCT 41.2  MCV 96.7  PLT 096   Basic Metabolic Panel: Recent Labs  Lab 05/14/20 1605  NA 143  K 3.9  CL 110  CO2 24  GLUCOSE 137*  BUN 17  CREATININE 1.17*  CALCIUM 8.5*   GFR: CrCl cannot be calculated (Unknown ideal weight.). Liver Function Tests: Recent Labs  Lab 05/14/20 1605  AST 25  ALT 13  ALKPHOS 114  BILITOT 0.6  PROT 7.0  ALBUMIN 3.7   No results for input(s): LIPASE, AMYLASE in the last 168 hours. No results for input(s): AMMONIA in the last 168 hours. Coagulation Profile: No results for input(s): INR, PROTIME in the last 168 hours. Cardiac Enzymes: No results for input(s): CKTOTAL, CKMB, CKMBINDEX, TROPONINI in the last 168 hours. BNP (last 3 results) No results for input(s): PROBNP in the last 8760 hours. HbA1C: No results for input(s): HGBA1C in the last 72 hours. CBG: No results for input(s): GLUCAP in the last 168 hours. Lipid Profile: No results for input(s): CHOL, HDL, LDLCALC, TRIG, CHOLHDL, LDLDIRECT in the last 72 hours. Thyroid Function Tests: Recent Labs    05/14/20 1658  TSH 2.739   Anemia Panel: No results for input(s): VITAMINB12, FOLATE, FERRITIN, TIBC, IRON, RETICCTPCT in the last 72 hours. Urine analysis:    Component Value Date/Time   COLORURINE STRAW (A) 05/14/2020 1800   APPEARANCEUR TURBID (A) 05/14/2020 1800   LABSPEC 1.010 05/14/2020 1800   PHURINE 6.0 05/14/2020 1800   GLUCOSEU NEGATIVE 05/14/2020 1800   HGBUR SMALL (A) 05/14/2020 1800   BILIRUBINUR NEGATIVE 05/14/2020 1800   KETONESUR 20 (A) 05/14/2020 1800   PROTEINUR 100 (A) 05/14/2020 1800   NITRITE NEGATIVE 05/14/2020 1800   LEUKOCYTESUR LARGE (A) 05/14/2020 1800   Sepsis Labs: @LABRCNTIP (procalcitonin:4,lacticidven:4) )No results found for this or any previous visit (from the past 240 hour(s)).   Radiological Exams on Admission: CT Head Wo Contrast  Result Date: 05/14/2020 CLINICAL DATA:  Delirium. Additional provided: Increasing  generalized weakness and reduced mobility for several months EXAM: CT HEAD WITHOUT CONTRAST TECHNIQUE: Contiguous axial images were obtained from the base of the skull through the vertex without intravenous contrast. COMPARISON:  No pertinent prior exams are available for comparison. FINDINGS: Brain: Moderate generalized parenchymal atrophy. Advanced ill-defined hypoattenuation within the cerebral white matter is nonspecific, but consistent with chronic small vessel ischemic disease. There are multiple lacunar infarcts within the bilateral basal ganglia, some of which are clearly chronic and some of which are small and age-indeterminate. Small chronic appearing lacunar infarct within the right thalamus. Small chronic appearing infarct within the right cerebellum. Small chronic cortically based infarct within the paramedian right parietal lobe. No demarcated cortical infarct. No extra-axial fluid collection. No evidence of intracranial mass. No midline shift. Vascular: No hyperdense vessel.  Atherosclerotic calcifications Skull: Normal. Negative for fracture or focal lesion. Sinuses/Orbits: Visualized orbits show no acute finding. Trace ethmoid sinus mucosal thickening. No significant mastoid effusion IMPRESSION: No evidence of acute intracranial hemorrhage or acute demarcated  cortical infarct. There are multiple lacunar infarcts within the bilateral basal ganglia, some of which are clearly chronic and some of which are small and age-indeterminate. Small chronic appearing infarcts are also present within the right thalamus and right cerebellar hemisphere. Background moderate generalized parenchymal atrophy and advanced cerebral white matter chronic small vessel ischemic disease. Small chronic cortically based infarct within the paramedian right parietal lobe. Minimal ethmoid sinus mucosal thickening. Electronically Signed   By: Kellie Simmering DO   On: 05/14/2020 17:32   DG Chest Port 1 View  Result Date:  05/14/2020 CLINICAL DATA:  Weakness.  Confusion.  Cough. EXAM: PORTABLE CHEST 1 VIEW COMPARISON:  None. FINDINGS: Numerous leads and wires project over the chest. Midline trachea. Borderline cardiomegaly. Aortic atherosclerosis. Left costophrenic angle minimally excluded. No pleural effusion or pneumothorax. Clear lungs. IMPRESSION: No acute cardiopulmonary disease. Electronically Signed   By: Abigail Miyamoto M.D.   On: 05/14/2020 17:24    EKG: Independently reviewed.  Showed sinus rhythm with LVH by voltage criteria, also LAD and some nonspecific ST changes  Assessment/Plan Principal Problem:   AMS (altered mental status) Active Problems:   UTI (urinary tract infection)   Leucocytosis   AKI (acute kidney injury) ([place])     #1 altered mental status: Most likely secondary to metabolic encephalopathy.  CVA can still not be ruled out especially with previous multiple lacunar infarcts and even previous CVA.  Patient to be admitted.  MRI of the brain is now showing multiple small areas of CVA.  Treat UTI.  #2 Acute CVA: On MRI.  Transfer patient to [facility].  Follow the stroke pathway now.  #3 UTI: May be responsible for patient's confusion.  Continue empiric Rocephin follow culture results.  #4 hypertensive urgency: Patient has not been on any blood pressure medications prior to admission.  We may need to do permissive hypertension now that she has got a new CVA.  #5 acute kidney injury: Gentle hydration.    DVT prophylaxis: Lovenox Code Status: Full code Family Communication: Family over the phone Disposition Plan: To be determined Consults called: Dr. Leonel Ramsay, neurology Admission status: Inpatient  Severity of Illness: The appropriate patient status for this patient is INPATIENT. Inpatient status is judged to be reasonable and necessary in order to provide the required intensity of service to ensure the patient's safety. The patient's presenting symptoms, physical exam findings,  and initial radiographic and laboratory data in the context of their chronic comorbidities is felt to place them at high risk for further clinical deterioration. Furthermore, it is not anticipated that the patient will be medically stable for discharge from the hospital within 2 midnights of admission. The following factors support the patient status of inpatient.   " The patient's presenting symptoms include confusion. " The worrisome physical exam findings include altered mental status. " The initial radiographic and laboratory data are worrisome because of evidence of UTI but also multiple small CVAs. " The chronic co-morbidities include unknown.   * I certify that at the point of admission it is my clinical judgment that the patient will require inpatient hospital care spanning beyond 2 midnights from the point of admission due to high intensity of service, high risk for further deterioration and high frequency of surveillance required.Barbette Merino MD [facility] Pager [phone]  If 7PM-7AM, please contact night-coverage [email or web address] Password [email or web address]  05/14/2020, 8:21 PM

## 2020-05-15 ENCOUNTER — Inpatient Hospital Stay (HOSPITAL_COMMUNITY): Payer: Medicare Other

## 2020-05-15 DIAGNOSIS — I6340 Cerebral infarction due to embolism of unspecified cerebral artery: Secondary | ICD-10-CM

## 2020-05-15 DIAGNOSIS — R4182 Altered mental status, unspecified: Secondary | ICD-10-CM

## 2020-05-15 DIAGNOSIS — I639 Cerebral infarction, unspecified: Secondary | ICD-10-CM | POA: Diagnosis present

## 2020-05-15 DIAGNOSIS — I351 Nonrheumatic aortic (valve) insufficiency: Secondary | ICD-10-CM

## 2020-05-15 LAB — GLUCOSE, CAPILLARY
Glucose-Capillary: 145 mg/dL — ABNORMAL HIGH (ref 70–99)
Glucose-Capillary: 139 mg/dL — ABNORMAL HIGH (ref 70–99)

## 2020-05-15 LAB — COMPREHENSIVE METABOLIC PANEL
ALT: 10 U/L (ref 0–44)
AST: 34 U/L (ref 15–41)
Albumin: 3.5 g/dL (ref 3.5–5.0)
Alkaline Phosphatase: 100 U/L (ref 38–126)
Anion gap: 14 (ref 5–15)
BUN: 20 mg/dL (ref 8–23)
CO2: 18 mmol/L — ABNORMAL LOW (ref 22–32)
Calcium: 8.5 mg/dL — ABNORMAL LOW (ref 8.9–10.3)
Chloride: 106 mmol/L (ref 98–111)
Creatinine, Ser: 1.25 mg/dL — ABNORMAL HIGH (ref 0.44–1.00)
GFR calc Af Amer: 49 mL/min — ABNORMAL LOW (ref >60)
GFR calc non Af Amer: 42 mL/min — ABNORMAL LOW (ref >60)
Glucose, Bld: 165 mg/dL — ABNORMAL HIGH (ref 70–99)
Potassium: 5.6 mmol/L — ABNORMAL HIGH (ref 3.5–5.1)
Sodium: 138 mmol/L (ref 135–145)
Total Bilirubin: 1 mg/dL (ref 0.3–1.2)
Total Protein: 6.7 g/dL (ref 6.5–8.1)

## 2020-05-15 LAB — AMMONIA: Ammonia: 41 umol/L — ABNORMAL HIGH (ref 9–35)

## 2020-05-15 LAB — ECHOCARDIOGRAM COMPLETE
Area-P 1/2: 3.19 cm2
Height: 65 in
P 1/2 time: 411 msec
S' Lateral: 2.25 cm
Weight: 2800 oz

## 2020-05-15 LAB — CBC
HCT: 45.2 % (ref 36.0–46.0)
Hemoglobin: 14.6 g/dL (ref 12.0–15.0)
MCH: 31.8 pg (ref 26.0–34.0)
MCHC: 32.3 g/dL (ref 30.0–36.0)
MCV: 98.5 fL (ref 80.0–100.0)
Platelets: 260 10*3/uL (ref 150–400)
RBC: 4.59 MIL/uL (ref 3.87–5.11)
RDW: 12.9 % (ref 11.5–15.5)
WBC: 13.5 10*3/uL — ABNORMAL HIGH (ref 4.0–10.5)
nRBC: 0 % (ref 0.0–0.2)

## 2020-05-15 LAB — LDL CHOLESTEROL, DIRECT: Direct LDL: 122.8 mg/dL — ABNORMAL HIGH (ref 0–99)

## 2020-05-15 LAB — HEMOGLOBIN A1C
Hgb A1c MFr Bld: 6.6 % — ABNORMAL HIGH (ref 4.8–5.6)
Mean Plasma Glucose: 142.72 mg/dL

## 2020-05-15 LAB — VITAMIN B12
Vitamin B-12: 131 pg/mL — ABNORMAL LOW (ref 180–914)
Vitamin B-12: 133 pg/mL — ABNORMAL LOW (ref 180–914)

## 2020-05-15 LAB — TSH: TSH: 3.535 u[IU]/mL (ref 0.350–4.500)

## 2020-05-15 LAB — FOLATE: Folate: 3.8 ng/mL — ABNORMAL LOW (ref >5.9)

## 2020-05-15 IMAGING — MR MR MRA HEAD W/O CM
1 series · 19 of 48 positions shown · non-contrast
Comparison: Brain MRI [DATE], head CT [DATE].

CLINICAL DATA: Neuro deficit, acute, stroke suspected.

EXAM:
MRA HEAD WITHOUT CONTRAST
TECHNIQUE: Angiographic images of the Circle of Willis were obtained using MRA
technique without intravenous contrast.

[Series 2: ax (id) · axial · 1.0mm · 0.43mm/px · z∈[-48,+39]mm · 19 of 184 slices shown]
[im 1/184]
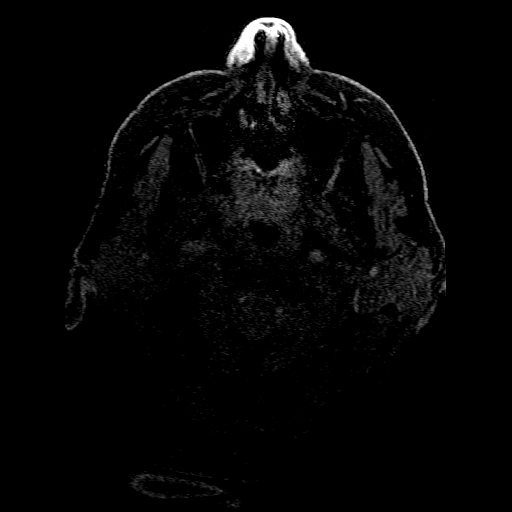
[im 4/184]
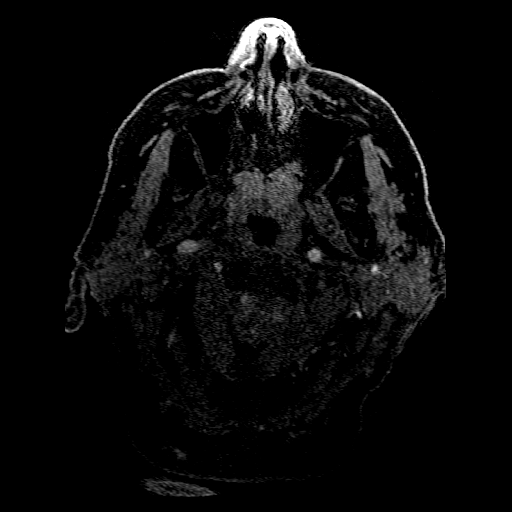
[im 8/184]
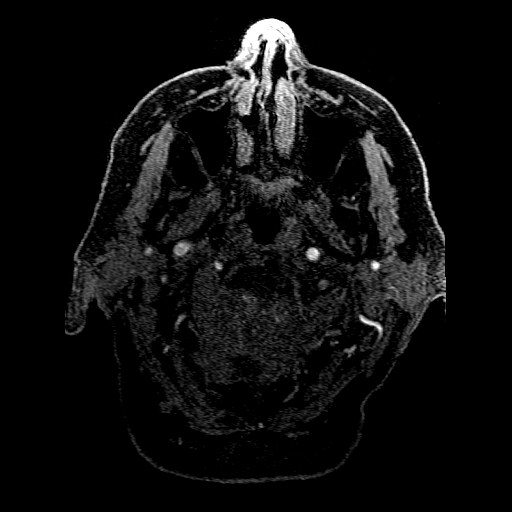
[im 12/184]
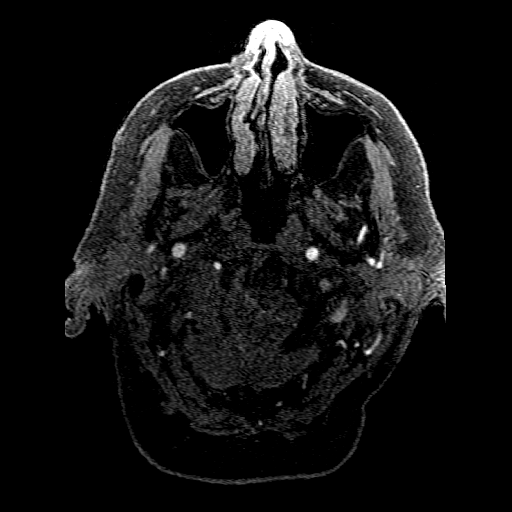
[im 16/184]
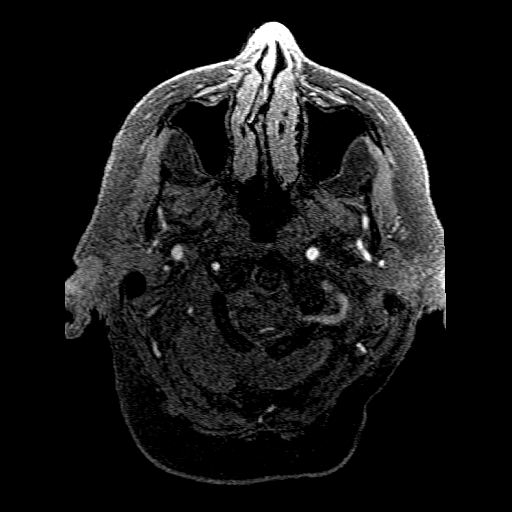
[im 20/184]
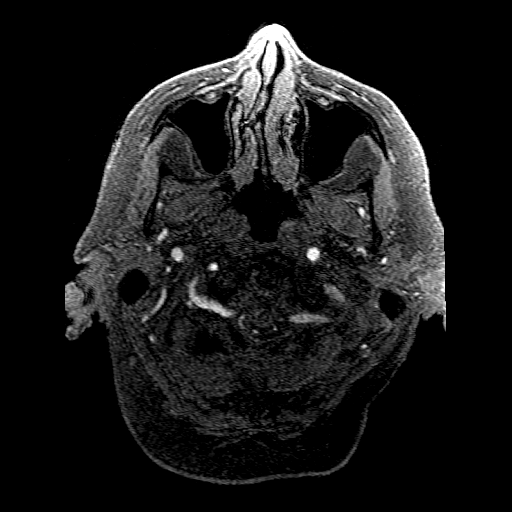
[im 24/184]
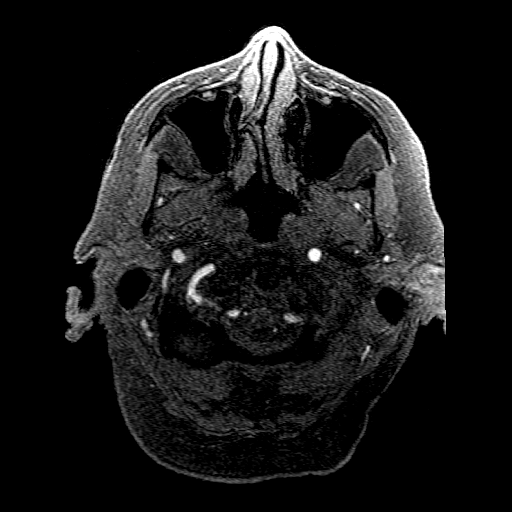
[im 28/184]
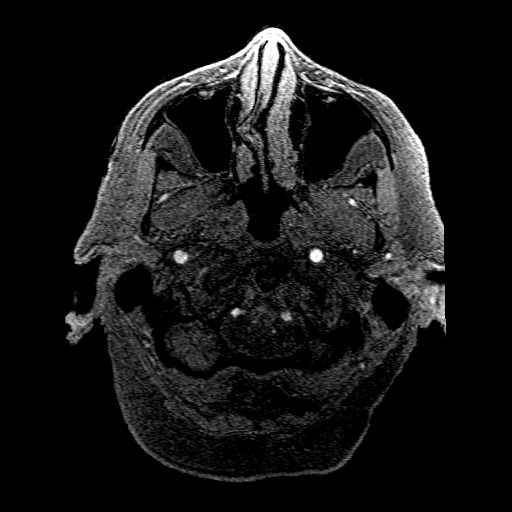
[im 32/184]
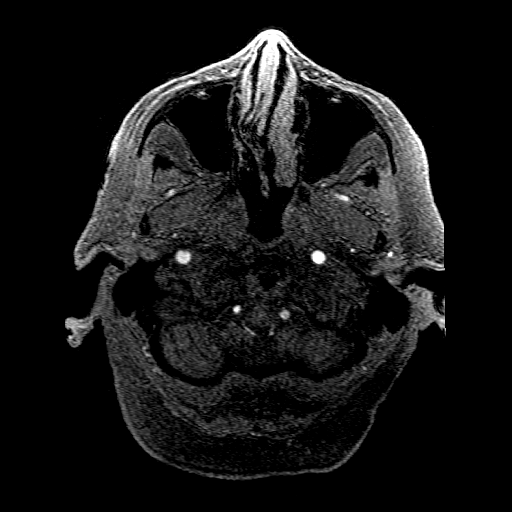
[im 36/184]
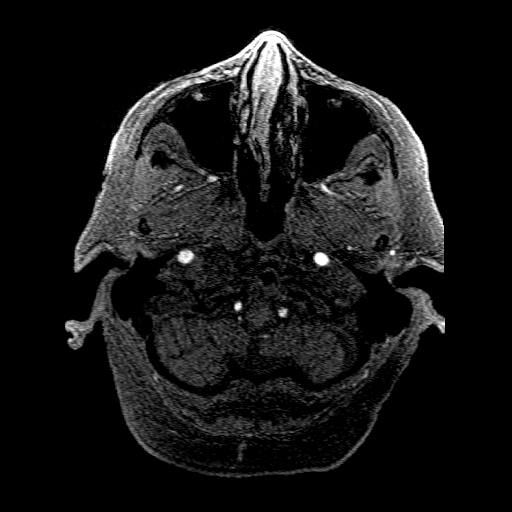
[im 39/184]
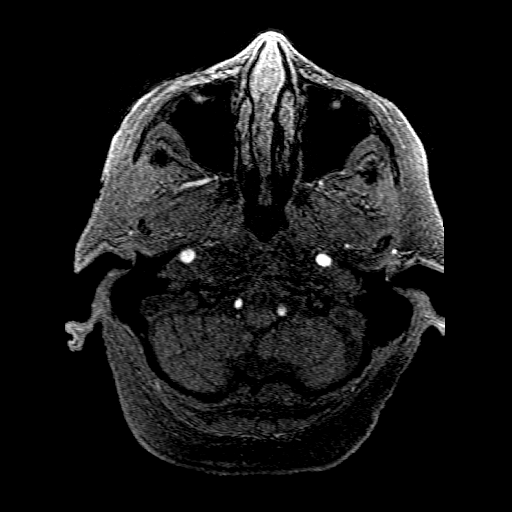
[im 59/184]
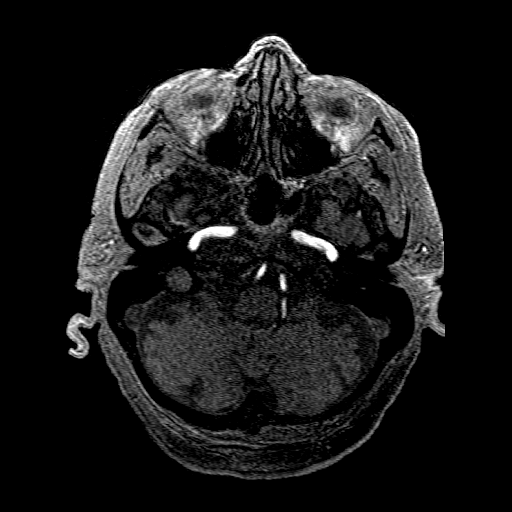
[im 82/184]
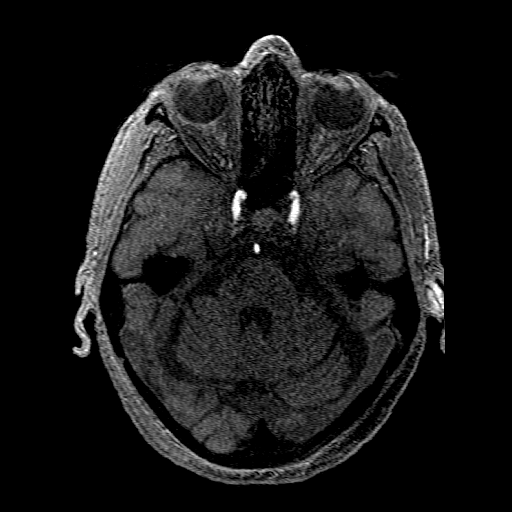
[im 94/184]
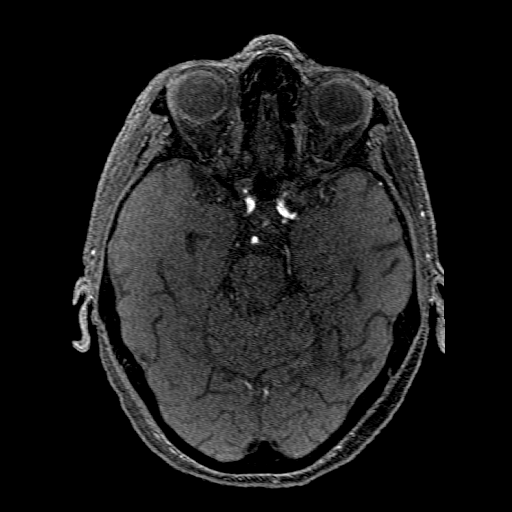
[im 106/184]
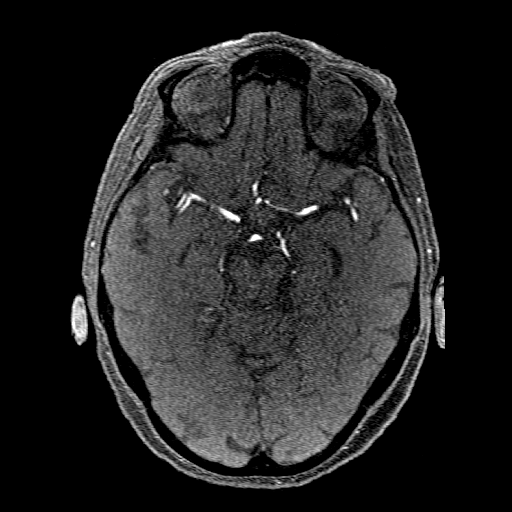
[im 129/184]
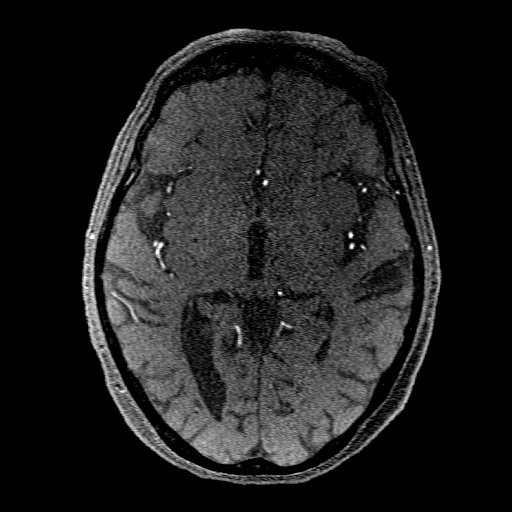
[im 152/184]
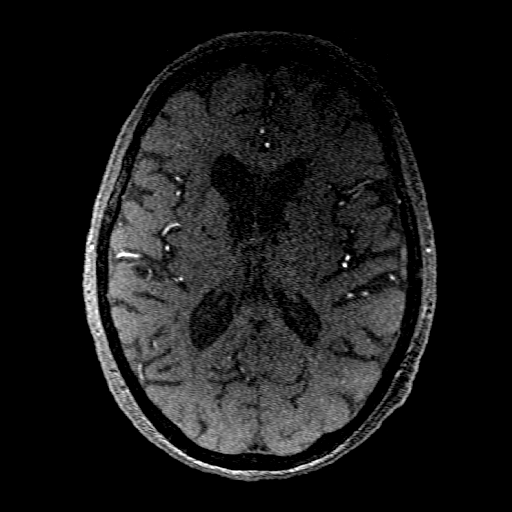
[im 156/184]
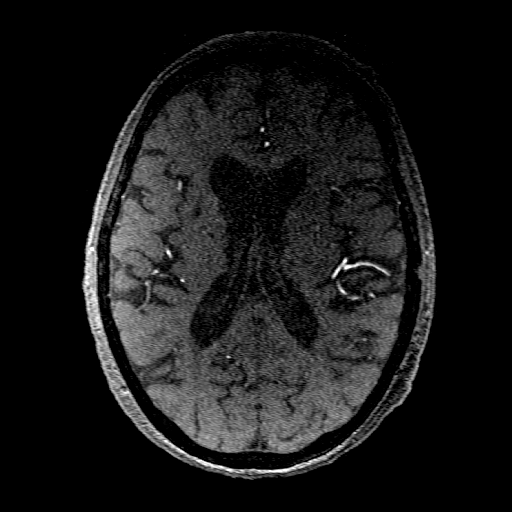
[im 176/184]
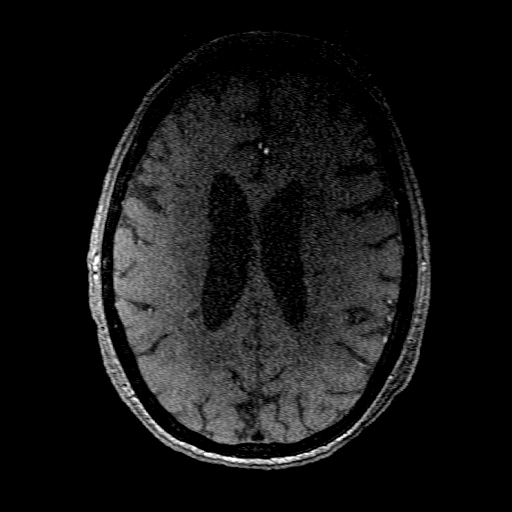

[19 of 48 positions shown; findings below may reference images not displayed]

FINDINGS: The intracranial internal carotid arteries are patent. The M1 middle
cerebral arteries are patent without significant stenosis. No M2
proximal branch occlusion or high-grade proximal stenosis is
identified. The anterior cerebral arteries are patent.

The intracranial vertebral arteries are patent. The basilar artery
is patent. The posterior cerebral arteries are patent. Moderate
stenoses within the left posterior cerebral artery at the P1/P2
junction and P3/P4 junction. A left posterior communicating artery
is present. The right posterior communicating artery is hypoplastic
or absent.

3 mm saccular aneurysm arising from the right aspect of the anterior
communicating artery complex (for instance as seen on series 2,
image 101).
IMPRESSION: No intracranial large vessel occlusion.

Moderate stenoses are present within the left posterior cerebral
artery at the P1/P2 junction and P3/P4 junction.

3 mm saccular aneurysm arising from the right aspect of the anterior
communicating artery complex.

## 2020-05-15 MED ORDER — LACTULOSE 10 GM/15ML PO SOLN
20.0000 g | Freq: Two times a day (BID) | ORAL | Status: DC
  Administered 2020-05-15 – 2020-05-17 (×6): 20 g via ORAL
  Filled 2020-05-15 (×7): qty 30

## 2020-05-15 MED ORDER — THIAMINE HCL 100 MG PO TABS
100.0000 mg | ORAL_TABLET | Freq: Every day | ORAL | Status: DC
  Administered 2020-05-15: 100 mg via ORAL
  Filled 2020-05-15: qty 1

## 2020-05-15 MED ORDER — PERFLUTREN LIPID MICROSPHERE
1.0000 mL | INTRAVENOUS | Status: AC | PRN
  Administered 2020-05-15: 3 mL via INTRAVENOUS
  Filled 2020-05-15: qty 10

## 2020-05-15 MED ORDER — POLYETHYLENE GLYCOL 3350 17 G PO PACK: 17.0000 g | PACK | Freq: Every day | ORAL | Status: DC | PRN

## 2020-05-15 MED ORDER — ATORVASTATIN CALCIUM 40 MG PO TABS
40.0000 mg | ORAL_TABLET | Freq: Every day | ORAL | Status: DC
  Administered 2020-05-15 – 2020-05-18 (×4): 40 mg via ORAL
  Filled 2020-05-15 (×4): qty 1

## 2020-05-15 MED ORDER — SENNOSIDES-DOCUSATE SODIUM 8.6-50 MG PO TABS: 2.0000 | ORAL_TABLET | Freq: Every evening | ORAL | Status: DC | PRN

## 2020-05-15 MED ORDER — THIAMINE HCL 100 MG/ML IJ SOLN: 100.0000 mg | Freq: Every day | INTRAMUSCULAR | Status: DC

## 2020-05-15 MED ORDER — FOLIC ACID 1 MG PO TABS
1.0000 mg | ORAL_TABLET | Freq: Every day | ORAL | Status: DC
  Administered 2020-05-15 – 2020-05-18 (×4): 1 mg via ORAL
  Filled 2020-05-15 (×5): qty 1

## 2020-05-15 MED ORDER — VITAMIN B-12 1000 MCG PO TABS
1000.0000 ug | ORAL_TABLET | Freq: Every day | ORAL | Status: DC
  Administered 2020-05-15: 1000 ug via ORAL
  Filled 2020-05-15: qty 1

## 2020-05-15 NOTE — ED Notes (Signed)
Upon entering room, pt has removed both IV's and legs are on side rail. Pt repositioned in bed, arm cleansed where IV was and new IV started.

## 2020-05-15 NOTE — Progress Notes (Signed)
PROGRESS NOTE    Melinda Olson  VVO:[id number] DOB: 1946-03-10 DOA: 05/14/2020 PCP: Patient, No Pcp Per   Brief Narrative:  74 year old with no significant past medical history admitted for generalized weakness and reduced mobility for the past several months and decline in mentation.  She was admitted to the hospital for further care and management.   Assessment & Plan:   Principal Problem:   AMS (altered mental status) Active Problems:   UTI (urinary tract infection)   Leucocytosis   AKI (acute kidney injury) ([place])   Acute CVA (cerebrovascular accident) ([place])  Acute encephalopathy, multifactorial Acute multiple small infarcts -MRI brain-truncated study due to altered mental status, multiple areas of abnormal diffusivity concerns for small infarcts -MRA-no acute vessel occlusion, moderate stenosis of the posterior circulation, 3 mm saccular aneurysm -A1c 6.6, LDL 122 -Start Lipitor 40 mg daily -Neurology following -Accu-Cheks, TSH normal -Currently on D5 half-normal saline  Urinary tract infection without hematuria -Urine cultures ordered -Empiric Rocephin  Vitamin B12 and folate deficiency -Supplements ordered  Elevated ammonia -On lactulose, titrate to 2-3 bowel movements daily   DVT prophylaxis: Lovenox Code Status: Full  Family Communication:  Spouse didn't answer.   Status is: Inpatient  Remains inpatient appropriate because:Inpatient level of care appropriate due to severity of illness   Dispo: The patient is from: Home              Anticipated d/c is to: Home              Anticipated d/c date is: 1 day              Patient currently is not medically stable to d/c.  Ongoing evaluation for confusion, patient's mentation still not back at baseline.  Maintain hospital stay until mentation is improved.       Body mass index is 29.12 kg/m.     Subjective: Patient is alert to her name only at this time.  Otherwise she is able to answer basic  questions and denies any complaints at the moment.  Review of Systems Otherwise negative except as per HPI, including: General: Denies fever, chills, night sweats or unintended weight loss. Resp: Denies cough, wheezing, shortness of breath. Cardiac: Denies chest pain, palpitations, orthopnea, paroxysmal nocturnal dyspnea. GI: Denies abdominal pain, nausea, vomiting, diarrhea or constipation GU: Denies dysuria, frequency, hesitancy or incontinence MS: Denies muscle aches, joint pain or swelling Neuro: Denies headache, neurologic deficits (focal weakness, numbness, tingling), abnormal gait Psych: Denies anxiety, depression, SI/HI/AVH Skin: Denies new rashes or lesions ID: Denies sick contacts, exotic exposures, travel  Examination:  Constitutional: Not in acute distress, elderly frail Respiratory: Clear to auscultation bilaterally Cardiovascular: Normal sinus rhythm, no rubs Abdomen: Nontender nondistended good bowel sounds Musculoskeletal: No edema noted Skin: No rashes seen Neurologic: CN 2-12 grossly intact.  And nonfocal Psychiatric: Poor judgment and insight, alert to her name only  Objective: Vitals:   05/15/20 0600 05/15/20 0639 05/15/20 0800 05/15/20 1157  BP: 126/82 (!) 155/101  119/79  Pulse: 83 78 72 75  Resp: 18 18    Temp:  (!) 97.5 F (36.4 C) 98.6 F (37 C) (!) 97.4 F (36.3 C)  TempSrc:  Oral Oral Oral  SpO2: 96% 98% 98% 99%  Weight:      Height:        Intake/Output Summary (Last 24 hours) at 05/15/2020 1404 Last data filed at 05/15/2020 0609 Gross per 24 hour  Intake 914.25 ml  Output --  Net 914.25 ml  Filed Weights   05/14/20 2117  Weight: 79.4 kg     Data Reviewed:   CBC: Recent Labs  Lab 05/14/20 1605 05/15/20 0415  WBC 12.1* 13.5*  NEUTROABS 10.5*  --   HGB 13.5 14.6  HCT 41.2 45.2  MCV 96.7 98.5  PLT 269 161   Basic Metabolic Panel: Recent Labs  Lab 05/14/20 1605 05/15/20 0415  NA 143 138  K 3.9 5.6*  CL 110 106  CO2 24  18*  GLUCOSE 137* 165*  BUN 17 20  CREATININE 1.17* 1.25*  CALCIUM 8.5* 8.5*   GFR: Estimated Creatinine Clearance: 40.5 mL/min (A) (by C-G formula based on SCr of 1.25 mg/dL (H)). Liver Function Tests: Recent Labs  Lab 05/14/20 1605 05/15/20 0415  AST 25 34  ALT 13 10  ALKPHOS 114 100  BILITOT 0.6 1.0  PROT 7.0 6.7  ALBUMIN 3.7 3.5   No results for input(s): LIPASE, AMYLASE in the last 168 hours. Recent Labs  Lab 05/15/20 0747  AMMONIA 41*   Coagulation Profile: No results for input(s): INR, PROTIME in the last 168 hours. Cardiac Enzymes: No results for input(s): CKTOTAL, CKMB, CKMBINDEX, TROPONINI in the last 168 hours. BNP (last 3 results) No results for input(s): PROBNP in the last 8760 hours. HbA1C: Recent Labs    05/15/20 0747  HGBA1C 6.6*   CBG: No results for input(s): GLUCAP in the last 168 hours. Lipid Profile: Recent Labs    05/15/20 0747  LDLDIRECT 122.8*   Thyroid Function Tests: Recent Labs    05/15/20 0747  TSH 3.535   Anemia Panel: Recent Labs    05/15/20 0747 05/15/20 1106  VITAMINB12 131* 133*  FOLATE 3.8*  --    Sepsis Labs: No results for input(s): PROCALCITON, LATICACIDVEN in the last 168 hours.  Recent Results (from the past 240 hour(s))  SARS Coronavirus 2 by RT PCR (hospital order, performed in [facility] hospital lab) Nasopharyngeal Nasopharyngeal Swab     Status: None   Collection Time: 05/14/20  8:52 PM   Specimen: Nasopharyngeal Swab  Result Value Ref Range Status   SARS Coronavirus 2 NEGATIVE NEGATIVE Final    Comment: (NOTE) SARS-CoV-2 target nucleic acids are NOT DETECTED.  The SARS-CoV-2 RNA is generally detectable in upper and lower respiratory specimens during the acute phase of infection. The lowest concentration of SARS-CoV-2 viral copies this assay can detect is 250 copies / mL. A negative result does not preclude SARS-CoV-2 infection and should not be used as the sole basis for treatment or  other patient management decisions.  A negative result may occur with improper specimen collection / handling, submission of specimen other than nasopharyngeal swab, presence of viral mutation(s) within the areas targeted by this assay, and inadequate number of viral copies (<250 copies / mL). A negative result must be combined with clinical observations, patient history, and epidemiological information.  Fact Sheet for Patients:   StrictlyIdeas.no  Fact Sheet for Healthcare Providers: BankingDealers.co.za  This test is not yet approved or  cleared by the [place] FDA and has been authorized for detection and/or diagnosis of SARS-CoV-2 by FDA under an Emergency Use Authorization (EUA).  This EUA will remain in effect (meaning this test can be used) for the duration of the COVID-19 declaration under Section 564(b)(1) of the Act, 21 U.S.C. section 360bbb-3(b)(1), unless the authorization is terminated or revoked sooner.  Performed at [facility], [place] [address], [place], [place] [address]  Radiology Studies: CT Head Wo Contrast  Result Date: 05/14/2020 CLINICAL DATA:  Delirium. Additional provided: Increasing generalized weakness and reduced mobility for several months EXAM: CT HEAD WITHOUT CONTRAST TECHNIQUE: Contiguous axial images were obtained from the base of the skull through the vertex without intravenous contrast. COMPARISON:  No pertinent prior exams are available for comparison. FINDINGS: Brain: Moderate generalized parenchymal atrophy. Advanced ill-defined hypoattenuation within the cerebral white matter is nonspecific, but consistent with chronic small vessel ischemic disease. There are multiple lacunar infarcts within the bilateral basal ganglia, some of which are clearly chronic and some of which are small and age-indeterminate. Small chronic appearing lacunar infarct within the right  thalamus. Small chronic appearing infarct within the right cerebellum. Small chronic cortically based infarct within the paramedian right parietal lobe. No demarcated cortical infarct. No extra-axial fluid collection. No evidence of intracranial mass. No midline shift. Vascular: No hyperdense vessel.  Atherosclerotic calcifications Skull: Normal. Negative for fracture or focal lesion. Sinuses/Orbits: Visualized orbits show no acute finding. Trace ethmoid sinus mucosal thickening. No significant mastoid effusion IMPRESSION: No evidence of acute intracranial hemorrhage or acute demarcated cortical infarct. There are multiple lacunar infarcts within the bilateral basal ganglia, some of which are clearly chronic and some of which are small and age-indeterminate. Small chronic appearing infarcts are also present within the right thalamus and right cerebellar hemisphere. Background moderate generalized parenchymal atrophy and advanced cerebral white matter chronic small vessel ischemic disease. Small chronic cortically based infarct within the paramedian right parietal lobe. Minimal ethmoid sinus mucosal thickening. Electronically Signed   By: Kellie Simmering DO   On: 05/14/2020 17:32   MR ANGIO HEAD WO CONTRAST  Result Date: 05/15/2020 CLINICAL DATA:  Neuro deficit, acute, stroke suspected. EXAM: MRA HEAD WITHOUT CONTRAST TECHNIQUE: Angiographic images of the Circle of Willis were obtained using MRA technique without intravenous contrast. COMPARISON:  Brain MRI 05/14/2020, head CT 05/14/2020. FINDINGS: The intracranial internal carotid arteries are patent. The M1 middle cerebral arteries are patent without significant stenosis. No M2 proximal branch occlusion or high-grade proximal stenosis is identified. The anterior cerebral arteries are patent. The intracranial vertebral arteries are patent. The basilar artery is patent. The posterior cerebral arteries are patent. Moderate stenoses within the left posterior cerebral  artery at the P1/P2 junction and P3/P4 junction. A left posterior communicating artery is present. The right posterior communicating artery is hypoplastic or absent. 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 IMPRESSION: No intracranial large vessel occlusion. Moderate stenoses are present within the left posterior cerebral artery at the P1/P2 junction and P3/P4 junction. 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 Electronically Signed   By: Kellie Simmering DO   On: 05/15/2020 09:25   MR Brain Wo Contrast (neuro protocol)  Result Date: 05/14/2020 CLINICAL DATA:  Weakness and decreased mobility EXAM: MRI HEAD WITHOUT CONTRAST TECHNIQUE: Multiplanar, multiecho pulse sequences of the brain and surrounding structures were obtained without intravenous contrast. COMPARISON:  None. FINDINGS: The examination was discontinued prior to completion due to patient altered mental status and inability to cooperate with the technologist's instructions. Axial diffusion-weighted imaging only was obtained. There are multiple small areas of hyperintensity on diffusion-weighted imaging, including the right frontal white matter, right periatrial white matter and left temporal lobe. No midline shift or other mass effect. IMPRESSION: 1. Truncated examination due to patient altered mental status and inability  to cooperate with the technologist's instructions. 2. Multiple small areas of abnormal diffusivity within the right frontal white matter, right periatrial white matter and left temporal lobe. These are most likely small infarcts, possibly from an embolic source. Electronically Signed   By: Ulyses Jarred M.D.   On: 05/14/2020 21:46   DG Chest Port 1 View  Result Date: 05/14/2020 CLINICAL DATA:  Weakness.  Confusion.  Cough. EXAM: PORTABLE CHEST 1 VIEW COMPARISON:  None.  FINDINGS: Numerous leads and wires project over the chest. Midline trachea. Borderline cardiomegaly. Aortic atherosclerosis. Left costophrenic angle minimally excluded. No pleural effusion or pneumothorax. Clear lungs. IMPRESSION: No acute cardiopulmonary disease. Electronically Signed   By: Abigail Miyamoto M.D.   On: 05/14/2020 17:24   VAS US CAROTID  Result Date: 05/15/2020 Carotid Arterial Duplex Study Indications: Weakness and confusion. Performing Technologist: Griffin Basil RCT RDMS  Examination Guidelines: A complete evaluation includes B-mode imaging, spectral Doppler, color Doppler, and power Doppler as needed of all accessible portions of each vessel. Bilateral testing is considered an integral part of a complete examination. Limited examinations for reoccurring indications may be performed as noted.  Right Carotid Findings: +----------+--------+--------+--------+------------------+------------------+           PSV cm/sEDV cm/sStenosisPlaque DescriptionComments           +----------+--------+--------+--------+------------------+------------------+ CCA Prox  159     4                                                    +----------+--------+--------+--------+------------------+------------------+ CCA Distal81      10                                intimal thickening +----------+--------+--------+--------+------------------+------------------+ ICA Prox  112     23      1-39%                     intimal thickening +----------+--------+--------+--------+------------------+------------------+ ICA Distal104     22                                                   +----------+--------+--------+--------+------------------+------------------+ ECA       158     3                                                    +----------+--------+--------+--------+------------------+------------------+ +----------+--------+-------+--------+-------------------+           PSV cm/sEDV  cmsDescribeArm Pressure (mmHG) +----------+--------+-------+--------+-------------------+ [id number]     2                                  +----------+--------+-------+--------+-------------------+ +---------+--------+---+--------+--+---------+ VertebralPSV cm/s118EDV cm/s13Antegrade +---------+--------+---+--------+--+---------+  Left Carotid Findings: +----------+--------+--------+--------+--------------------+-------------------+           PSV cm/sEDV cm/sStenosisPlaque Description  Comments            +----------+--------+--------+--------+--------------------+-------------------+ CCA Prox  85      11                                                      +----------+--------+--------+--------+--------------------+-------------------+  CCA Distal104     13                                  intimal thickening  +----------+--------+--------+--------+--------------------+-------------------+ ICA Prox  319     36      60-79%  focal, hyperechoic  High resistant flow                                   and homogeneous                         +----------+--------+--------+--------+--------------------+-------------------+ ICA Distal126     12                                  intimal thickening  +----------+--------+--------+--------+--------------------+-------------------+ ECA       278     5       >50%    focal and                                                                 hyperechoic                             +----------+--------+--------+--------+--------------------+-------------------+ +----------+--------+--------+--------+-------------------+           PSV cm/sEDV cm/sDescribeArm Pressure (mmHG) +----------+--------+--------+--------+-------------------+ Subclavian132     2                                   +----------+--------+--------+--------+-------------------+ +---------+--------+---+--------+-+----------+  VertebralPSV cm/s107EDV cm/s3Retrograde +---------+--------+---+--------+-+----------+   Summary: Right Carotid: Velocities in the right ICA are consistent with a 1-39% stenosis. Left Carotid: Velocities in the left ICA are consistent with a 60-79% stenosis.               The ECA appears >50% stenosed. Vertebrals: Right vertebral artery demonstrates antegrade flow. Left vertebral             artery demonstrates retrograde flow. *See table(s) above for measurements and observations.  Electronically signed by Antony Contras MD on 05/15/2020 at 1:10:29 PM.    Final         Scheduled Meds: . cloNIDine  0.1 mg Oral TID  . enoxaparin (LOVENOX) injection  40 mg Subcutaneous Q24H  . thiamine injection  100 mg Intravenous Daily   Or  . thiamine  100 mg Oral Daily   Continuous Infusions: . cefTRIAXone (ROCEPHIN)  IV    . dextrose 5 % and 0.45% NaCl 100 mL/hr at 05/15/20 0648     LOS: 1 day   Time spent= 25 mins    Zaharah Amir Arsenio Loader, MD [facility]  If 7PM-7AM, please contact night-coverage  05/15/2020, 2:04 PM \

## 2020-05-15 NOTE — NC FL2 (Signed)
[place] MEDICAID FL2 LEVEL OF CARE SCREENING TOOL     IDENTIFICATION  Patient Name: Melinda Olson Birthdate: [birth year] Sex: female Admission Date (Current Location): 05/14/2020  [facility] and [place] Number:  Herbalist and Address:  [facility], [place] [address] [place], [place] [address]      Provider Number: [id number]  Attending Physician Name and Address:  Damita Lack, MD  Relative Name and Phone Number:       Current Level of Care: Hospital Recommended Level of Care: [place] Prior Approval Number:    Date Approved/Denied:   PASRR Number: [id number]  Discharge Plan: SNF    Current Diagnoses: Patient Active Problem List   Diagnosis Date Noted  . Acute CVA (cerebrovascular accident) ([place]) 05/15/2020  . AMS (altered mental status) 05/14/2020  . UTI (urinary tract infection) 05/14/2020  . Leucocytosis 05/14/2020  . AKI (acute kidney injury) ([place]) 05/14/2020    Orientation RESPIRATION BLADDER Height & Weight     Self  Normal Incontinent Weight: 175 lb (79.4 kg) Height:  5\' 5"  (165.1 cm)  BEHAVIORAL SYMPTOMS/MOOD NEUROLOGICAL BOWEL NUTRITION STATUS      Continent Diet (Heart Healthy)  AMBULATORY STATUS COMMUNICATION OF NEEDS Skin   Extensive Assist Verbally Normal                       Personal Care Assistance Level of Assistance  Bathing, Feeding, Dressing Bathing Assistance: Maximum assistance Feeding assistance: Limited assistance Dressing Assistance: Maximum assistance     Functional Limitations Info  Hearing, Speech   Hearing Info: Impaired (HOH) Speech Info: Impaired (dysarthria)    SPECIAL CARE FACTORS FREQUENCY  PT (By licensed PT), OT (By licensed OT), Speech therapy     PT Frequency: 5xwk OT Frequency: 5xwk     Speech Therapy Frequency: 5xwk      Contractures Contractures Info: Not present    Additional Factors Info  Code Status, Allergies Code Status Info:  Full Allergies Info: NKA           Current Medications (05/15/2020):  This is the current hospital active medication list Current Facility-Administered Medications  Medication Dose Route Frequency Provider Last Rate Last Admin  . acetaminophen (TYLENOL) tablet 650 mg  650 mg Oral Q6H PRN Elwyn Reach, MD       Or  . acetaminophen (TYLENOL) suppository 650 mg  650 mg Rectal Q6H PRN Elwyn Reach, MD      . atorvastatin (LIPITOR) tablet 40 mg  40 mg Oral Daily Amin, Ankit Chirag, MD      . cefTRIAXone (ROCEPHIN) 1 g in sodium chloride 0.9 % 100 mL IVPB  1 g Intravenous Q24H Garba, Mohammad L, MD      . cloNIDine (CATAPRES) tablet 0.1 mg  0.1 mg Oral TID Gala Romney L, MD   0.1 mg at 05/15/20 1105  . dextrose 5 %-0.45 % sodium chloride infusion   Intravenous Continuous Elwyn Reach, MD 100 mL/hr at 05/15/20 0648 New Bag at 05/15/20 7106  . enoxaparin (LOVENOX) injection 40 mg  40 mg Subcutaneous Q24H Gala Romney L, MD   40 mg at 26/94/85 4627  . folic acid (FOLVITE) tablet 1 mg  1 mg Oral Daily Amin, Ankit Chirag, MD      . labetalol (NORMODYNE) injection 5 mg  5 mg Intravenous Q2H PRN Elwyn Reach, MD   5 mg at 05/14/20 2335  . lactulose (CHRONULAC) 10 GM/15ML solution 20  g  20 g Oral BID Amin, Ankit Chirag, MD      . ondansetron (ZOFRAN) tablet 4 mg  4 mg Oral Q6H PRN Elwyn Reach, MD       Or  . ondansetron (ZOFRAN) injection 4 mg  4 mg Intravenous Q6H PRN Gala Romney L, MD      . perflutren lipid microspheres (DEFINITY) IV suspension  1-10 mL Intravenous PRN Donnetta Simpers, MD   3 mL at 05/15/20 1546  . polyethylene glycol (MIRALAX / GLYCOLAX) packet 17 g  17 g Oral Daily PRN Amin, Ankit Chirag, MD      . senna-docusate (Senokot-S) tablet 2 tablet  2 tablet Oral QHS PRN Amin, Ankit Chirag, MD      . thiamine (B-1) injection 100 mg  100 mg Intravenous Daily Donnetta Simpers, MD       Or  . thiamine tablet 100 mg  100 mg Oral Daily Donnetta Simpers, MD   100 mg at 05/15/20 1106  . vitamin B-12 (CYANOCOBALAMIN) tablet 1,000 mcg  1,000 mcg Oral Daily Amin, Jeanella Flattery, MD         Discharge Medications: Please see discharge summary for a list of discharge medications.  Relevant Imaging Results:  Relevant Lab Results:   Additional Information SSN: [id number]  Marguerita Merles, Student-Social Work

## 2020-05-15 NOTE — Progress Notes (Addendum)
CSW attempted to reach patient's spouse to discuss recommendation for SNF. Attempted cell phone, no answer and no voicemail setup. Called house phone and left a voicemail for call back.  Laveda Abbe, LCSW Clinical Social Worker [phone]    UPDATE 2:36PM: CSW received call back from Tamora, patient's daughter-in-law. Per Lattie Haw, the patient's son, [name], will be the best person to contact over the patient's spouse. Lattie Haw provided Rick's contact number, and CSW added to the chart. CSW explained recommendation for SNF, and Lattie Haw discussed how the family would really like a medical update from MD before discussing any discharge plans. CSW asked MD to update family when able. CSW to follow.

## 2020-05-15 NOTE — TOC Initial Note (Signed)
Transition of Care [facility]) - Initial/Assessment Note    Patient Details  Name: Melinda Olson MRN: [id number] Date of Birth: 12/30/1945  Transition [facility]) CM/SW Contact:    Melinda Ochs, LCSW Phone Number: 05/15/2020, 4:18 PM  Clinical Narrative:       CSW spoke with patient's son, Melinda Olson, to discuss recommendation for SNF placement. Rick in agreement, as he is aware that his dad cannot take care of the patient the way that she is right now, but is concerned that the patient may not improve. CSW received permission to fax out referral and will follow with bed offers.            Expected Discharge Plan: Skilled Nursing Facility Barriers to Discharge: Continued Medical Work up   Patient Goals and CMS Choice Patient states their goals for this hospitalization and ongoing recovery are:: patient unable to participate in goal setting due to disorientation CMS Medicare.gov Compare Post Acute Care list provided to:: Patient Represenative (must comment) Choice offered to / list presented to : Adult Children  Expected Discharge Plan and Services Expected Discharge Plan: [place] Acute Care Choice: [place] Living arrangements for the past 2 months: Single Family Home                                      Prior Living Arrangements/Services Living arrangements for the past 2 months: Single Family Home Lives with:: Spouse Patient language and need for interpreter reviewed:: No Do you feel safe going back to the place where you live?: Yes      Need for Family Participation in Patient Care: Yes (Comment) Care giver support system in place?: No (comment) Current home services: DME Criminal Activity/Legal Involvement Pertinent to Current Situation/Hospitalization: No - Comment as needed  Activities of Daily Living Home Assistive Devices/Equipment: Wheelchair ADL Screening (condition at time of admission) Patient's cognitive ability  adequate to safely complete daily activities?: No Is the patient deaf or have difficulty hearing?: No Does the patient have difficulty seeing, even when wearing glasses/contacts?: No Does the patient have difficulty concentrating, remembering, or making decisions?: Yes Patient able to express need for assistance with ADLs?: Yes Does the patient have difficulty dressing or bathing?: Yes Independently performs ADLs?: No Communication: Independent Dressing (OT): Independent Grooming: Independent Feeding: Independent Bathing: Needs assistance Is this a change from baseline?: Pre-admission baseline Toileting: Needs assistance Is this a change from baseline?: Pre-admission baseline In/Out Bed: Needs assistance Is this a change from baseline?: Pre-admission baseline Walks in Home: Dependent Is this a change from baseline?: Pre-admission baseline Does the patient have difficulty walking or climbing stairs?: Yes Weakness of Legs: Both Weakness of Arms/Hands: Both  Permission Sought/Granted Permission sought to share information with : Facility Sport and exercise psychologist, Family Supports Permission granted to share information with : Yes, Verbal Permission Granted  Share Information with NAME: Melinda Olson  Permission granted to share info w AGENCY: SNF  Permission granted to share info w Relationship: Son, Daughter-in-law, Spouse     Emotional Assessment   Attitude/Demeanor/Rapport: Unable to Assess Affect (typically observed): Unable to Assess Orientation: : Oriented to Self Alcohol / Substance Use: Not Applicable Psych Involvement: No (comment)  Admission diagnosis:  Failure to thrive in adult [R62.7] Acute cystitis with hematuria [N30.01] AMS (altered mental status) [R41.82] Patient Active Problem List   Diagnosis Date Noted  . Acute  CVA (cerebrovascular accident) ([place]) 05/15/2020  . AMS (altered mental status) 05/14/2020  . UTI (urinary tract infection) 05/14/2020  .  Leucocytosis 05/14/2020  . AKI (acute kidney injury) ([place]) 05/14/2020   PCP:  Patient, No Pcp Per Pharmacy:   [id number] - [place], [place] [address] [place] [address] Phone: [phone] Fax: [phone]     Social Determinants of Health (SDOH) Interventions    Readmission Risk Interventions No flowsheet data found.

## 2020-05-15 NOTE — Progress Notes (Signed)
  Echocardiogram 2D Echocardiogram has been performed.  Melinda Olson 05/15/2020, 3:38 PM

## 2020-05-15 NOTE — Progress Notes (Signed)
Carotid study completed.   See CVProc for preliminary results.   Lilyannah Zuelke, RDMS, RVT 

## 2020-05-15 NOTE — Consult Note (Signed)
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 female with no significant past medical history.  Patient was brought to the ED by EMS secondary to noting generalized weakness and reduced mobility over the last 7 months along and decline with mentation.  Patient has not seen a provider in over 25 years.  On arrival to the ED patient was able to speak to the son and husband, they stated that she has been declining for several months, now however for the past 3 days she was unable to walk to which she usually uses a walker at home.  For that reason patient was admitted for weakness and failure to thrive.  During work-up MRI was obtained which showed multiple small areas of abnormality in the diffusion within the right frontal white matter, right periatrial white matter and left temporal lobe which are likely embolic.  Thus neurology was consulted.  At time of consultation with patient she is still very confused, believes she is at [employer] [facility].  She is eating her lunch however it is very noticeable that she is pocketing her food and at times choking on her food.  She could not give me any history.  She does have physical signs of stroke however.  LKW: Unknown tpa given?: no, unknown last known well Premorbid modified Rankin scale (mRS): 4  NIHSS 1a Level of Conscious.: 0 1b LOC Questions: 2 1c LOC Commands: 0 2 Best Gaze: 0 3 Visual: 0 4 Facial Palsy: 2 5a Motor Arm - left: 0 5b Motor Arm - Right: 0 6a Motor Leg - Left: 1 6b Motor Leg - Right: 1 7 Limb Ataxia: 0 8 Sensory: 0 9 Best Language: 0 10 Dysarthria: 2 11 Extinct. and Inatten.: 0 TOTAL: 8   History reviewed. No pertinent past medical history.  History reviewed. No pertinent family history.  Social History:   reports that she has been smoking cigarettes. She has never used  smokeless tobacco. She reports that she does not drink alcohol and does not use drugs.  Medications  Current Facility-Administered Medications:  .  acetaminophen (TYLENOL) tablet 650 mg, 650 mg, Oral, Q6H PRN **OR** acetaminophen (TYLENOL) suppository 650 mg, 650 mg, Rectal, Q6H PRN, Jonelle Sidle, Mohammad L, MD .  cefTRIAXone (ROCEPHIN) 1 g in sodium chloride 0.9 % 100 mL IVPB, 1 g, Intravenous, Q24H, Garba, Mohammad L, MD .  cloNIDine (CATAPRES) tablet 0.1 mg, 0.1 mg, Oral, TID, Jonelle Sidle, Mohammad L, MD, 0.1 mg at 05/15/20 1105 .  dextrose 5 %-0.45 % sodium chloride infusion, , Intravenous, Continuous, Garba, Mohammad L, MD, Last Rate: 100 mL/hr at 05/15/20 0648, New Bag at 05/15/20 0648 .  enoxaparin (LOVENOX) injection 40 mg, 40 mg, Subcutaneous, Q24H, Garba, Mohammad L, MD, 40 mg at 05/14/20 2145 .  labetalol (NORMODYNE) injection 5 mg, 5 mg, Intravenous, Q2H PRN, Gala Romney L, MD, 5 mg at 05/14/20 2335 .  ondansetron (ZOFRAN) tablet 4 mg, 4 mg, Oral, Q6H PRN **OR** ondansetron (ZOFRAN) injection 4 mg, 4 mg, Intravenous, Q6H PRN, Garba, Mohammad L, MD .  polyethylene glycol (MIRALAX / GLYCOLAX) packet 17 g, 17 g, Oral, Daily PRN, Amin, Ankit Chirag, MD .  senna-docusate (Senokot-S) tablet 2 tablet, 2 tablet, Oral, QHS PRN, Amin, Ankit Chirag, MD .  thiamine (B-1) injection 100 mg, 100 mg, Intravenous, Daily **OR** thiamine tablet 100 mg, 100 mg, Oral, Daily, Khaliqdina, Salman,  MD, 100 mg at 05/15/20 1106  QVZ:DGLOVF to obtain due to altered mental status.    Exam: Current vital signs: BP 119/79 (BP Location: Left Arm)   Pulse 75   Temp (!) 97.4 F (36.3 C) (Oral)   Resp 18   Ht 5\' 5"  (1.651 m)   Wt 79.4 kg   SpO2 99%   BMI 29.12 kg/m  Vital signs in last 24 hours: Temp:  [97.4 F (36.3 C)-98.6 F (37 C)] 97.4 F (36.3 C) (09/01 1157) Pulse Rate:  [63-110] 75 (09/01 1157) Resp:  [14-22] 18 (09/01 0639) BP: (119-223)/(55-115) 119/79 (09/01 1157) SpO2:  [95 %-99 %] 99 % (09/01  1157) Weight:  [79.4 kg] 79.4 kg (08/31 2117)   Constitutional: Appears well-developed and well-nourished.  Eyes: No scleral injection HENT: No OP obstrucion Head: Normocephalic.  Cardiovascular: Palpable Respiratory: Effort normal, non-labored breathing GI: Soft.  No distension. There is no tenderness.  Skin: WDI  Neuro: Mental Status: Patient is awake, alert, not oriented to place, year, date.  She is able to name, repeat and follow simple commands.  She does not show any aphasia.  She is dysarthric however she is continues to have food in her mouth which needs to be suctioned out. Cranial Nerves: II: Visual Fields are full.  III,IV, VI: EOMI with ptosis in left eye. Pupils equal, round and reactive to light V: Facial sensation is symmetric to temperature VII: Left facial droop.  VIII: hearing is intact to voice XI: Shoulder shrug is symmetric. XII: tongue is midline without atrophy or fasciculations.  Motor: Patient has 5/5 with bicep flexion and 4/5 throughout other extremities.  No tremor was noted she did have positive drift in bilateral legs Sensory: Sensation is symmetric to light touch and temperature in the arms and legs. DSS intact Deep Tendon Reflexes: 2+ and symmetric in the biceps and patellae.  Plantars: Right toe downgoing left toe is chronically upgoing Cerebellar: FNF and HKS are intact bilaterally  Labs I have reviewed labs in epic and the results pertinent to this consultation are:  CBC    Component Value Date/Time   WBC 13.5 (H) 05/15/2020 0415   RBC 4.59 05/15/2020 0415   HGB 14.6 05/15/2020 0415   HCT 45.2 05/15/2020 0415   PLT 260 05/15/2020 0415   MCV 98.5 05/15/2020 0415   MCH 31.8 05/15/2020 0415   MCHC 32.3 05/15/2020 0415   RDW 12.9 05/15/2020 0415   LYMPHSABS 1.0 05/14/2020 1605   MONOABS 0.6 05/14/2020 1605   EOSABS 0.0 05/14/2020 1605   BASOSABS 0.1 05/14/2020 1605    CMP     Component Value Date/Time   NA 138 05/15/2020 0415    K 5.6 (H) 05/15/2020 0415   CL 106 05/15/2020 0415   CO2 18 (L) 05/15/2020 0415   GLUCOSE 165 (H) 05/15/2020 0415   BUN 20 05/15/2020 0415   CREATININE 1.25 (H) 05/15/2020 0415   CALCIUM 8.5 (L) 05/15/2020 0415   PROT 6.7 05/15/2020 0415   ALBUMIN 3.5 05/15/2020 0415   AST 34 05/15/2020 0415   ALT 10 05/15/2020 0415   ALKPHOS 100 05/15/2020 0415   BILITOT 1.0 05/15/2020 0415   GFRNONAA 42 (L) 05/15/2020 0415   GFRAA 49 (L) 05/15/2020 0415    Lipid Panel     Component Value Date/Time   LDLDIRECT 122.8 (H) 05/15/2020 0747     Imaging I have reviewed the images obtained:  CT-scan of the brain-no evidence of acute intracranial hemorrhage or acute demarcated cortical  infarct.  There are multiple lacunar infarcts within the bilateral basal ganglia, some of which are clearly chronic in some of which are small and age-indeterminate.  Small chronic appearing infarcts are also present within the right thalamus and the right cerebellum.  Background moderate generalized parenchymal atrophy and advanced peripheral white matter chronic small vessel ischemic disease.  MRI examination of the brain-exam was truncated however did show multiple small areas of abnormal diffusion within the right frontal white matter, right parietal white matter and left temporal lobe.  Most likely small infarcts and possibly from embolic source.  Etta Quill PA-C [facility] [phone]  M-F  (9:00 am- 5:00 PM)  05/15/2020, 12:44 PM     Assessment:  This is a 74 year old female presenting to the hospital secondary to failure to thrive and weakness that has been progressive.  During patient's work-up MRI was obtained which showed multiple punctate infarcts in multiple vascular distributions.  There is question of possible cardioembolic etiology.  Unfortunately there is no past medical history and patient as she has not seen a PCP and 25 years.  I do not believe at this time that the punctate  infarcts that were reviewed on MRI are causing her failure to thrive, weakness and likely neurocognitive decline.  However given the CT scan findings of multiple infarcts along with generalized parenchymal atrophy and advanced peripheral white matter chronic small vessel disease, patient very well could have vascular dementia, along with the failure to thrive.  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 -Failure to thrive  Recommend  -Transthoracic Echo  -Start patient on ASA 325mg  daily/300 mg aspirin rectally until speech therapy has cleared patient to eat solid foods -Start or continue Atorvastatin 80 mg/other high intensity statin -BP goal: permissive HTN less than 196 systolically as last known normal is unknown -HBAIC and Lipid profile -Telemetry monitoring -Frequent neuro checks -NPO until speech therapy has seen patient -PT/OT -We will need outpatient evaluation for neurocognitive decline/dementia # please page stroke NP  Or  PA  Or MD from 8am -4 pm  as this patient from this time will be  followed by the stroke.   You can look them up on [email or web address]  Password [email or web address]

## 2020-05-15 NOTE — Progress Notes (Signed)
Attempted echo.  Patient needs to be upright until speech therapy does a consult due to some choking issues.  Will attempt at a later time.

## 2020-05-15 NOTE — Evaluation (Signed)
Physical Therapy Evaluation Patient Details Name: Melinda Olson MRN: [id number] DOB: [birth year] Today's Date: 05/15/2020   History of Present Illness  74 y.o. female with medical history significant of no significant past medical history who was brought in by EMS due to generalized weakness over the last 3 days.  Patient apparently has been declining in the last few months but got worse in the last 3 days to where she could not get out of bed. PT also noted to have cognitive decline. Urinalysis reveals UTI.  Clinical Impression  Pt presents to PT with deficits in strength, power, functional mobility, balance, gait, endurance, and cognition. Pt is generally weak with very poor balance, demonstrating a posterior lean with all attempted standing activity. Pt requires significant physical assistance to maintain standing balance and to reduce falls risk. Pt will benefit from continued acute PT services to improve mobility quality and to reduce falls risk. PT recommends SNF placement at this time as the pt has limited physical assistance at home and remains at a high falls risk.    Follow Up Recommendations SNF;Supervision/Assistance - 24 hour    Equipment Recommendations  Wheelchair (measurements PT);Wheelchair cushion (measurements PT);Hospital bed (mechanical lift, all if home today)    Recommendations for Other Services       Precautions / Restrictions Precautions Precautions: Fall Restrictions Weight Bearing Restrictions: No      Mobility  Bed Mobility Overal bed mobility: Needs Assistance Bed Mobility: Supine to Sit     Supine to sit: Max assist        Transfers Overall transfer level: Needs assistance Equipment used: 1 person hand held assist;Rolling walker (2 wheeled) Transfers: Sit to/from [facility] Sit to Stand: Mod assist Stand pivot transfers: Max assist          Ambulation/Gait                Stairs            Wheelchair  Mobility    Modified Rankin (Stroke Patients Only)       Balance Overall balance assessment: Needs assistance Sitting-balance support: Bilateral upper extremity supported;Feet supported Sitting balance-Leahy Scale: Poor Sitting balance - Comments: minG-minA, reliant on BUE support of bed   Standing balance support: Bilateral upper extremity supported Standing balance-Leahy Scale: Poor Standing balance comment: min-modA due to posterior lean                             Pertinent Vitals/Pain Pain Assessment: No/denies pain    Home Living Family/patient expects to be discharged to:: Private residence Living Arrangements: Spouse/significant other Available Help at Discharge: Family;Available 24 hours/day (spouse ambulates with a cane, prior CVAs) Type of Home: House Home Access: Ramped entrance     Home Layout: One level Home Equipment: Walker - 4 wheels      Prior Function Level of Independence: Needs assistance   Gait / Transfers Assistance Needed: pt ambulates independently with use of Rollator  ADL's / Homemaking Assistance Needed: requries assistance for most ADLs including bathing and dressing        Hand Dominance        Extremity/Trunk Assessment   Upper Extremity Assessment Upper Extremity Assessment: Generalized weakness    Lower Extremity Assessment Lower Extremity Assessment: Generalized weakness    Cervical / Trunk Assessment Cervical / Trunk Assessment: Kyphotic  Communication   Communication: No difficulties  Cognition Arousal/Alertness: Awake/alert Behavior During Therapy: Flat affect Overall Cognitive Status:  Impaired/Different from baseline Area of Impairment: Orientation;Attention;Memory;Following commands;Safety/judgement;Awareness;Problem solving                 Orientation Level: Disoriented to;Place;Time (pt knows she is at a hospital with cueing) Current Attention Level: Focused Memory: Decreased recall of  precautions;Decreased short-term memory Following Commands: Follows one step commands consistently Safety/Judgement: Decreased awareness of safety;Decreased awareness of deficits Awareness: Intellectual Problem Solving: Slow processing;Requires verbal cues        General Comments General comments (skin integrity, edema, etc.): VSS on RA    Exercises     Assessment/Plan    PT Assessment Patient needs continued PT services  PT Problem List Decreased strength;Decreased activity tolerance;Decreased balance;Decreased mobility;Decreased cognition;Decreased knowledge of use of DME;Decreased safety awareness;Decreased knowledge of precautions       PT Treatment Interventions DME instruction;Gait training;Functional mobility training;Therapeutic activities;Therapeutic exercise;Balance training;Neuromuscular re-education;Cognitive remediation;Patient/family education;Wheelchair mobility training    PT Goals (Current goals can be found in the Care Plan section)  Acute Rehab PT Goals Patient Stated Goal: To improve mobility PT Goal Formulation: With patient Time For Goal Achievement: 05/29/20 Potential to Achieve Goals: Good    Frequency Min 2X/week   Barriers to discharge        Co-evaluation               AM-PAC PT "6 Clicks" Mobility  Outcome Measure Help needed turning from your back to your side while in a flat bed without using bedrails?: A Lot Help needed moving from lying on your back to sitting on the side of a flat bed without using bedrails?: Total Help needed moving to and from a bed to a chair (including a wheelchair)?: Total Help needed standing up from a chair using your arms (e.g., wheelchair or bedside chair)?: A Lot Help needed to walk in hospital room?: Total Help needed climbing 3-5 steps with a railing? : Total 6 Click Score: 8    End of Session   Activity Tolerance: Patient tolerated treatment well Patient left: in chair;with call bell/phone within  reach;with chair alarm set Nurse Communication: Mobility status PT Visit Diagnosis: Unsteadiness on feet (R26.81);Other abnormalities of gait and mobility (R26.89);Muscle weakness (generalized) (M62.81);Other symptoms and signs involving the nervous system (R29.898)    Time: 3716-9678 PT Time Calculation (min) (ACUTE ONLY): 25 min   Charges:   PT Evaluation $PT Eval Moderate Complexity: 1 Mod          Zenaida Niece, PT, DPT Acute Rehabilitation Pager: [phone]   Zenaida Niece 05/15/2020, 1:13 PM

## 2020-05-15 NOTE — Evaluation (Addendum)
Clinical/Bedside Swallow Evaluation Patient Details  Name: Melinda Olson MRN: [id number] Date of Birth: November 20, 1945  Today's Date: 05/15/2020 Time: SLP Start Time (ACUTE ONLY): 0263 SLP Stop Time (ACUTE ONLY): 1412 SLP Time Calculation (min) (ACUTE ONLY): 18 min  Past Medical History: History reviewed. No pertinent past medical history. Past Surgical History: History reviewed. No pertinent surgical history. HPI:  74 y.o. female with medical history significant of no significant past medical history who was brought in by EMS due to generalized weakness over the last 3 days. Per chart patient apparently has been declining in the last few months. MRI multiple small areas of abnormal diffusivity within the right frontal white matter, right periatrial white matter and left temporal lobe.   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 Minimal residue from lunch present cleared with oral hygiene. RN stated she removed copious amount following lunch. Her oral phase of swallow was marked by open mouth and inefficient mastication posture with delayed transit. Suspect inadequate laryngeal closure, elevation, tongue base/pharyngeal wall contraction indicated by multiple, audible/sluggish swallows and immediate and delayed coughs. An instrumental assessment recommended. Placed pt on nectar thick although may result in excessive residue and texture downgrade to puree. Pills should be crushed, assistance with feeding and swallow precautions. Plan on MBS tomorrow.      SLP Visit Diagnosis: Dysphagia, unspecified (R13.10)    Aspiration Risk  Mild aspiration risk;Moderate aspiration risk    Diet Recommendation Dysphagia 1 (Puree);Nectar-thick liquid   Liquid Administration via: Cup Medication Administration: Crushed  with puree Supervision: Staff to assist with self feeding;Full supervision/cueing for compensatory strategies Compensations: Slow rate;Minimize environmental distractions;Small sips/bites;Clear throat intermittently Postural Changes: Seated upright at 90 degrees    Other  Recommendations Oral Care Recommendations: Oral care BID   Follow up Recommendations Skilled Nursing facility      Frequency and Duration min 2x/week  2 weeks       Prognosis Prognosis for Safe Diet Advancement:  (fair-good) Barriers to Reach Goals: Cognitive deficits;Severity of deficits      Swallow Study   General HPI: 74 y.o. female with medical history significant of no significant past medical history who was brought in by EMS due to generalized weakness over the last 3 days. Per chart patient apparently has been declining in the last few months. MRI multiple small areas of abnormal diffusivity within the right frontal white matter, right periatrial white matter and left temporal lobe. Type of Study: Bedside Swallow Evaluation Previous Swallow Assessment:  (none) Diet Prior to this Study: Regular;Thin liquids Temperature Spikes Noted: No Respiratory Status: Room air History of Recent Intubation: No Behavior/Cognition: Cooperative;Alert;Requires cueing Oral Cavity Assessment: Other (comment);Dry (food particles ) Oral Care Completed by SLP: Yes Oral Cavity - Dentition: Poor condition;Missing dentition Vision: Functional for self-feeding Self-Feeding Abilities: Needs assist Patient Positioning: Upright in chair Baseline Vocal Quality: Low vocal intensity Volitional Cough: Weak Volitional Swallow: Able to elicit    Oral/Motor/Sensory Function Overall Oral Motor/Sensory Function: Mild impairment Facial ROM: Reduced left (? need to assess further) Velum: Suspected CN X (Vagus) dysfunction;Other (comment) (no noteable eleveation )   Ice Chips Ice chips: Impaired Presentation: Spoon Oral Phase Impairments:  Reduced lingual movement/coordination Oral Phase Functional Implications: Prolonged oral transit Pharyngeal Phase Impairments:  (none)   Thin Liquid Thin  Liquid: Impaired Presentation: Cup;Straw Oral Phase Impairments: Reduced lingual movement/coordination Oral Phase Functional Implications:  (oral hesitancy) Pharyngeal  Phase Impairments: Other (comments);Cough - Immediate;Cough - Delayed;Multiple swallows;Suspected delayed Swallow;Decreased hyoid-laryngeal movement (audible/sluggish sound)    Nectar Thick Nectar Thick Liquid: Not tested   Honey Thick Honey Thick Liquid: Not tested   Puree Puree: Impaired Presentation: Spoon Pharyngeal Phase Impairments: Decreased hyoid-laryngeal movement (weak/sluggish/audible)   Solid     Solid: Impaired Presentation: Self Fed Oral Phase Impairments: Reduced lingual movement/coordination;Impaired mastication Oral Phase Functional Implications:  (open mouth mastication ) Pharyngeal Phase Impairments:  (none)      Kynadi Dragos, Orbie Pyo 05/15/2020,4:51 PM  Orbie Pyo [place].Ed Risk analyst [phone] Office [phone]

## 2020-05-15 NOTE — ED Notes (Signed)
[facility] dispatch notified for need of transport.  

## 2020-05-15 NOTE — Evaluation (Addendum)
Speech Language Pathology Evaluation Patient Details Name: Melinda Olson MRN: [id number] DOB: 1946/01/16 Today's Date: 05/15/2020 Time: 8786-7672 SLP Time Calculation (min) (ACUTE ONLY): 18 min  Problem List:  Patient Active Problem List   Diagnosis Date Noted  . Acute CVA (cerebrovascular accident) ([place]) 05/15/2020  . AMS (altered mental status) 05/14/2020  . UTI (urinary tract infection) 05/14/2020  . Leucocytosis 05/14/2020  . AKI (acute kidney injury) ([place]) 05/14/2020   Past Medical History: History reviewed. No pertinent past medical history. Past Surgical History: History reviewed. No pertinent surgical history. HPI:  74 y.o. female with medical history significant of no significant past medical history who was brought in by EMS due to generalized weakness over the last 3 days. Per chart patient apparently has been declining in the last few months. MRI multiple small areas of abnormal diffusivity within the right frontal white matter, right periatrial white matter and left temporal lobe.   Assessment / Plan / Recommendation Clinical Impression  Pt demonstrates cognitive-communicative impairments in the areas of intellectual/emergent/anticipatory awareness, verbal and functional problem solving, working memory, divergent naming. Oral-motor weakness and imprecision present in addition to a ball-like mass of tissue on upper left neck under her ear area. Her responses were in short phrases and accurate for intent and comprehended most information showing difficulty with 2 units of information. Speech is dysarthric with decreased articulatory precision and question velar/pharyngeal wall competence, and (?) hypernasality. Pt would benefit from continued ST in areas aforementioned. She reports watching television most of the day at home.       SLP Assessment  SLP Recommendation/Assessment: Patient needs continued Speech Lanaguage Pathology Services SLP Visit Diagnosis: Dysarthria and  anarthria (R47.1);Cognitive communication deficit (R41.841)    Follow Up Recommendations  Skilled Nursing facility    Frequency and Duration min 2x/week  2 weeks      SLP Evaluation Cognition  Overall Cognitive Status: Impaired/Different from baseline Arousal/Alertness: Awake/alert Orientation Level: Oriented to person;Disoriented to place;Disoriented to time;Disoriented to situation Attention: Sustained Sustained Attention: Impaired Sustained Attention Impairment: Verbal basic Memory:  (to be formally assessed) Awareness: Impaired Awareness Impairment: Intellectual impairment;Emergent impairment;Anticipatory impairment Problem Solving: Impaired Problem Solving Impairment: Verbal basic;Functional basic Safety/Judgment: Impaired       Comprehension  Auditory Comprehension Overall Auditory Comprehension: Impaired Commands: Impaired Two Step Basic Commands: 75-100% accurate Interfering Components: Attention;Processing speed;Working Field seismologist: Tour manager: Not tested Reading Comprehension Reading Status:  (TBA)    Expression Expression Primary Mode of Expression: Verbal Verbal Expression Overall Verbal Expression: Impaired Initiation: Impaired Level of Generative/Spontaneous Verbalization: Phrase Pragmatics: Impairment Impairments: Abnormal affect Written Expression Written Expression:  (TBA)   Oral / Motor  Oral Motor/Sensory Function Overall Oral Motor/Sensory Function: Mild impairment Facial ROM: Reduced left Velum: Suspected CN X (Vagus) dysfunction;Other (comment) (no noteable elevation ) Motor Speech Overall Motor Speech: Impaired Respiration: Within functional limits Phonation: Low vocal intensity Resonance:  (? slight hypernasality) Articulation: Impaired Level of Impairment: Phrase Intelligibility: Intelligibility reduced Word: 75-100% accurate Phrase: 75-100% accurate Motor  Planning: Witnin functional limits   GO                    Houston Siren 05/15/2020, 5:20 PM Orbie Pyo [place] M.Ed Risk analyst [phone] Office [phone]

## 2020-05-16 ENCOUNTER — Inpatient Hospital Stay (HOSPITAL_COMMUNITY): Payer: Medicare Other

## 2020-05-16 DIAGNOSIS — I639 Cerebral infarction, unspecified: Secondary | ICD-10-CM

## 2020-05-16 LAB — GLUCOSE, CAPILLARY
Glucose-Capillary: 130 mg/dL — ABNORMAL HIGH (ref 70–99)
Glucose-Capillary: 110 mg/dL — ABNORMAL HIGH (ref 70–99)
Glucose-Capillary: 137 mg/dL — ABNORMAL HIGH (ref 70–99)
Glucose-Capillary: 122 mg/dL — ABNORMAL HIGH (ref 70–99)
Glucose-Capillary: 95 mg/dL (ref 70–99)

## 2020-05-16 LAB — BASIC METABOLIC PANEL
Anion gap: 10 (ref 5–15)
BUN: 17 mg/dL (ref 8–23)
CO2: 21 mmol/L — ABNORMAL LOW (ref 22–32)
Calcium: 8.4 mg/dL — ABNORMAL LOW (ref 8.9–10.3)
Chloride: 111 mmol/L (ref 98–111)
Creatinine, Ser: 1.12 mg/dL — ABNORMAL HIGH (ref 0.44–1.00)
GFR calc Af Amer: 56 mL/min — ABNORMAL LOW (ref >60)
GFR calc non Af Amer: 48 mL/min — ABNORMAL LOW (ref >60)
Glucose, Bld: 129 mg/dL — ABNORMAL HIGH (ref 70–99)
Potassium: 3.5 mmol/L (ref 3.5–5.1)
Sodium: 142 mmol/L (ref 135–145)

## 2020-05-16 LAB — CBC
HCT: 37.3 % (ref 36.0–46.0)
Hemoglobin: 12.1 g/dL (ref 12.0–15.0)
MCH: 31.9 pg (ref 26.0–34.0)
MCHC: 32.4 g/dL (ref 30.0–36.0)
MCV: 98.4 fL (ref 80.0–100.0)
Platelets: 225 10*3/uL (ref 150–400)
RBC: 3.79 MIL/uL — ABNORMAL LOW (ref 3.87–5.11)
RDW: 12.9 % (ref 11.5–15.5)
WBC: 10.2 10*3/uL (ref 4.0–10.5)
nRBC: 0 % (ref 0.0–0.2)

## 2020-05-16 LAB — FOLATE RBC
Folate, Hemolysate: 546 ng/mL
Folate, RBC: 2116 ng/mL (ref >498)
Hematocrit: 25.8 % — ABNORMAL LOW (ref 34.0–46.6)

## 2020-05-16 LAB — URINE CULTURE: Culture: >=100000 — AB

## 2020-05-16 LAB — LIPID PANEL
Cholesterol: 155 mg/dL (ref 0–200)
HDL: 30 mg/dL — ABNORMAL LOW (ref >40)
LDL Cholesterol: 99 mg/dL (ref 0–99)
Total CHOL/HDL Ratio: 5.2 RATIO
Triglycerides: 132 mg/dL (ref <150)
VLDL: 26 mg/dL (ref 0–40)

## 2020-05-16 LAB — MAGNESIUM: Magnesium: 2 mg/dL (ref 1.7–2.4)

## 2020-05-16 IMAGING — MR MR MRA HEAD W/O CM
9 of 11 series · 31 of 48 positions shown · non-contrast
Comparison: [DATE]

CLINICAL DATA: Stroke follow-up

EXAM:
MRI HEAD WITHOUT CONTRAST
MRA HEAD WITHOUT CONTRAST
TECHNIQUE: Multiplanar, multiecho pulse sequences of the brain and surrounding
structures were obtained without intravenous contrast. Angiographic
images of the head were obtained using MRA technique without
contrast.

[Series 4: DWI · axial · 3.0mm · 1.09mm/px · z∈[-83,+61]mm · 7 of 99 slices shown (1 of 4)]
[im 1/99]
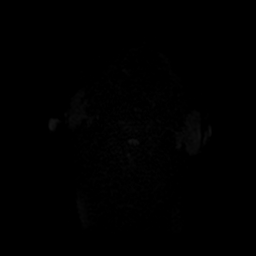
[im 17/99]
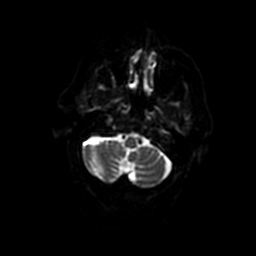
[im 33/99]
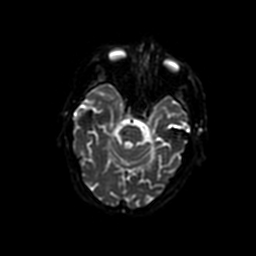
[im 50/99]
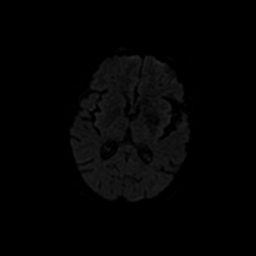
[im 66/99]
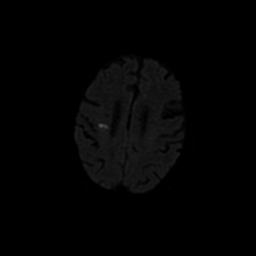
[im 82/99]
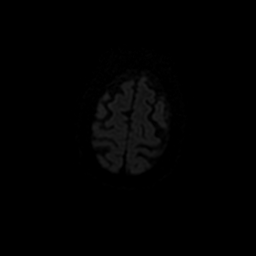
[im 99/99]
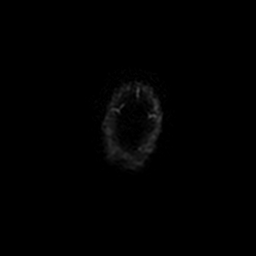

[Series 5: DWI · coronal · 5.0mm · 1.09mm/px · 6 of 74 slices shown (2 of 4)]
[im 1/74]
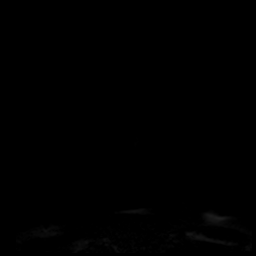
[im 15/74]
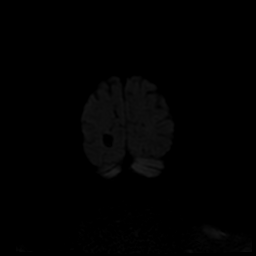
[im 30/74]
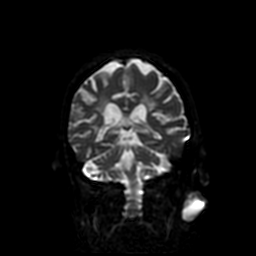
[im 44/74]
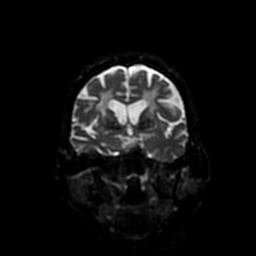
[im 59/74]
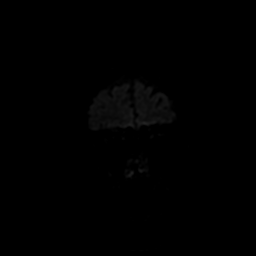
[im 74/74]
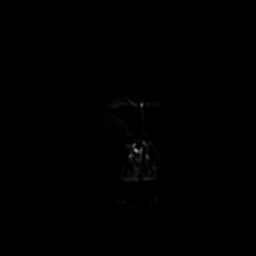

[Series 6: T1 · sagittal · 5.0mm · 0.47mm/px · 2 of 22 slices shown (1 of 2)]
[im 1/22]
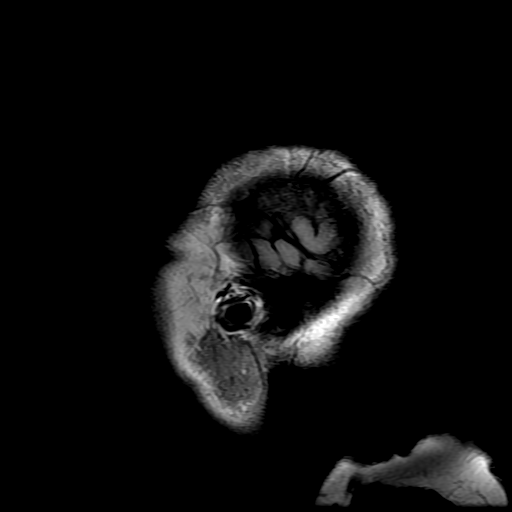
[im 22/22]
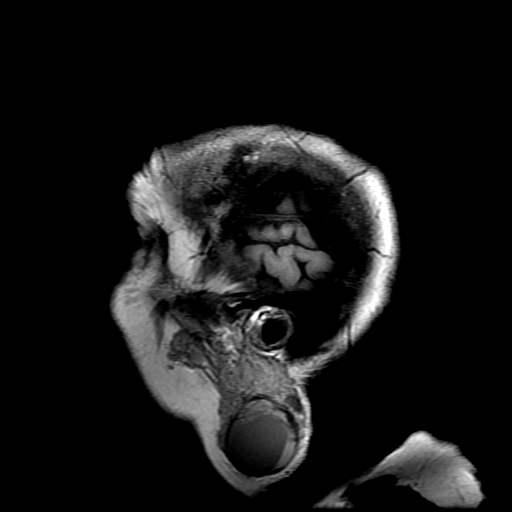

[Series 7: T2 · axial · 5.0mm · 0.47mm/px · z∈[-96,+39]mm · 2 of 24 slices shown (1 of 2)]
[im 1/24]
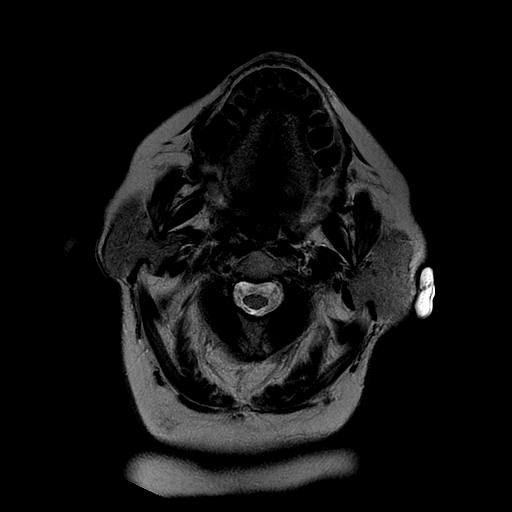
[im 24/24]
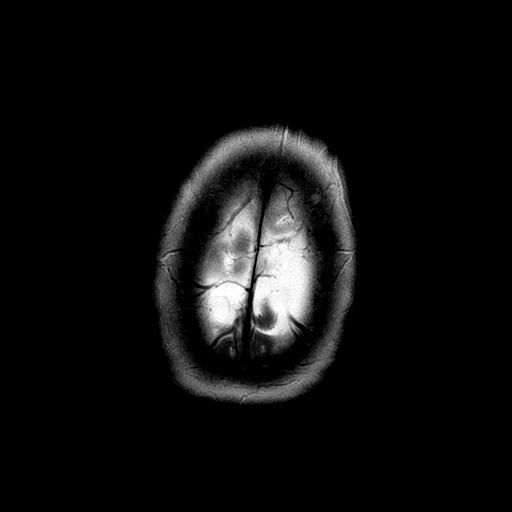

[Series 8: FLAIR · axial · 5.0mm · 0.47mm/px · z∈[-96,+39]mm · 2 of 24 slices shown]
[im 1/24]
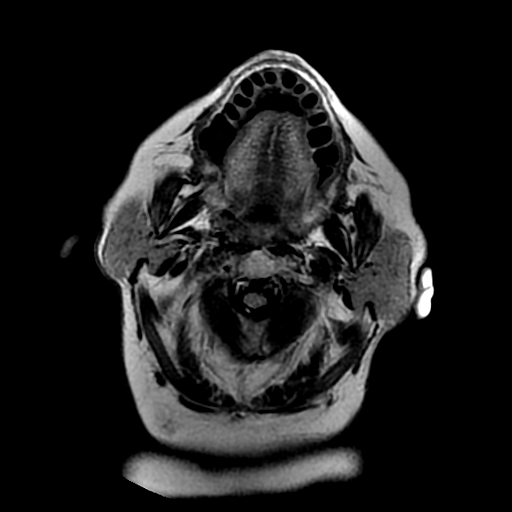
[im 24/24]
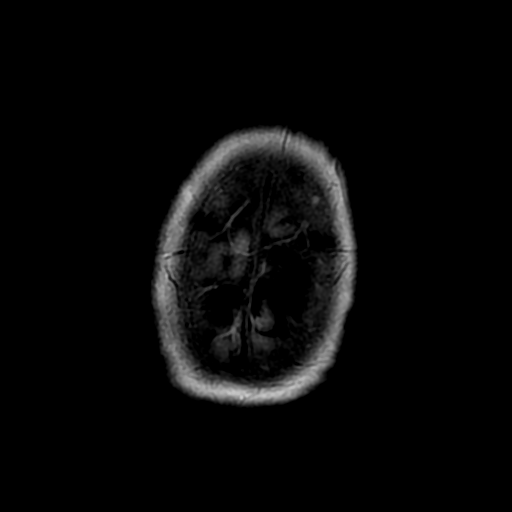

[Series 10: T1 · axial · 3.0mm · 0.43mm/px · z∈[-89,-48]mm · 3 of 100 slices shown (2 of 2)]
[im 1/100]
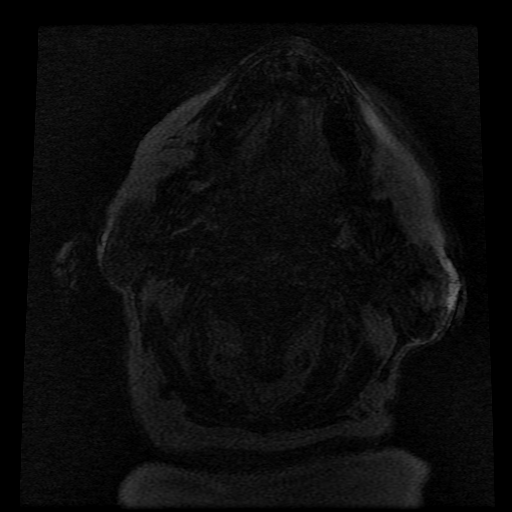
[im 15/100]
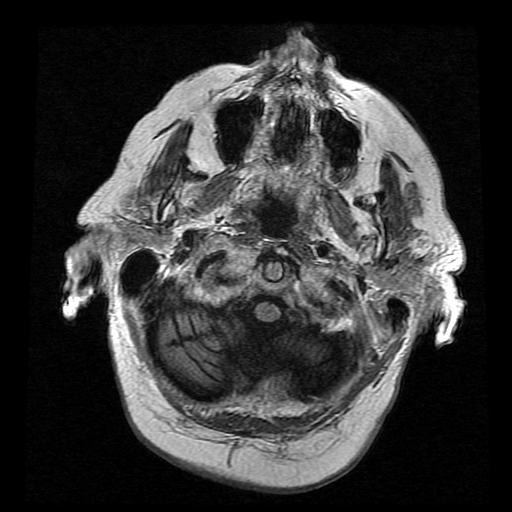
[im 29/100]
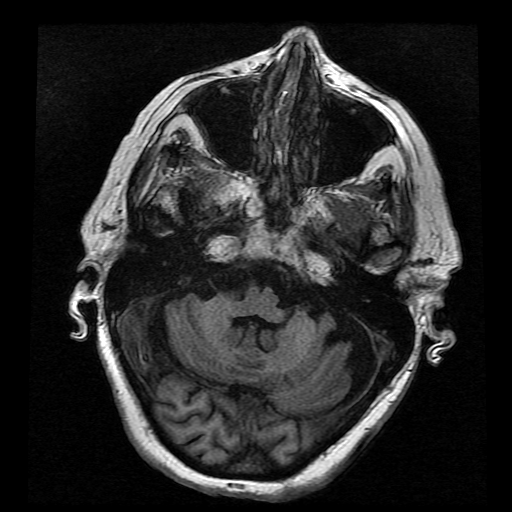

[Series 11: T2 · coronal · 5.0mm · 0.43mm/px · 2 of 24 slices shown (2 of 2)]
[im 1/24]
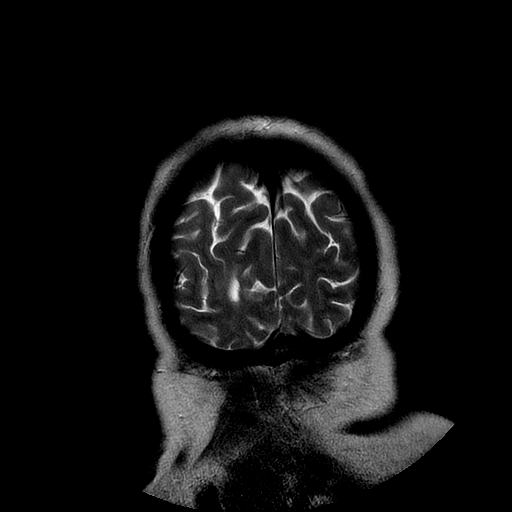
[im 24/24]
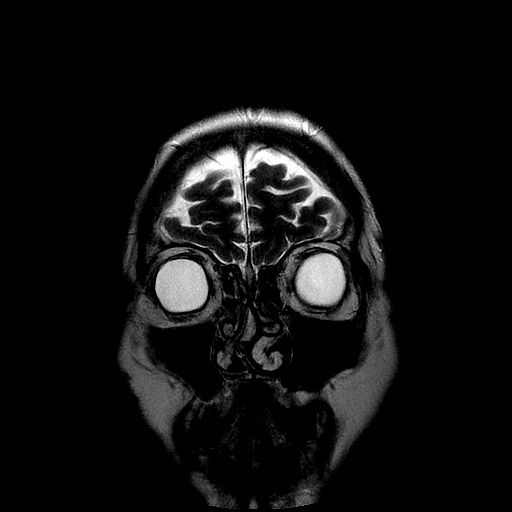

[Series 400: DWI · axial · 3.0mm · 1.09mm/px · z∈[-83,+61]mm · 4 of 50 slices shown (3 of 4)]
[im 1/50]
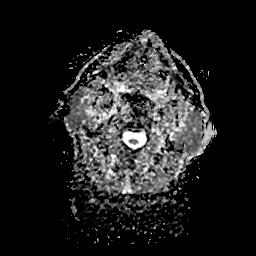
[im 17/50]
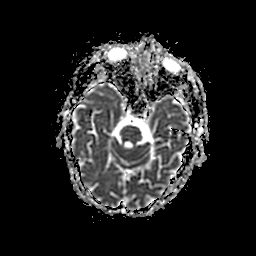
[im 33/50]
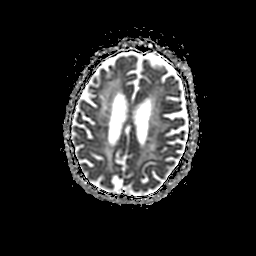
[im 50/50]
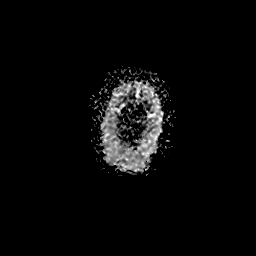

[Series 500: DWI · coronal · 5.0mm · 1.09mm/px · 3 of 35 slices shown (4 of 4)]
[im 1/35]
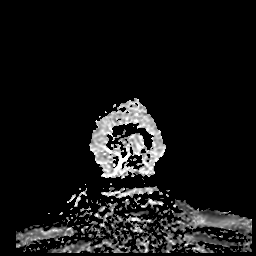
[im 18/35]
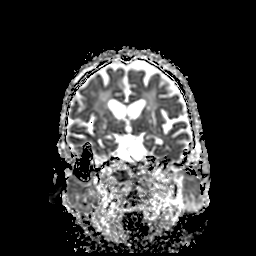
[im 35/35]
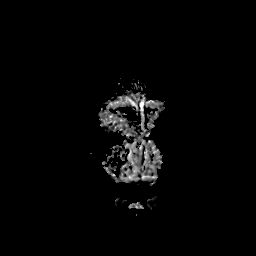

[31 of 48 positions shown; findings below may reference images not displayed]

FINDINGS: MRI HEAD FINDINGS

Brain: Restricted diffusion in the right frontal white matter
(corona radiata). Additional area of fainter restricted diffusion in
the right periatrial white matter and the left periatrial white
matter (serise 4, images 28 and 25). Mild associated edema without
substantial mass effect. There are numerous small remote lacunar
infarcts in bilateral basal ganglia and bilateral thalami. There are
additional small remote white matter infarcts bilaterally and small
bilateral cerebellar lacunar infarcts. There is moderate to advanced
suprimposed T2/FLAIR hyperintensity in the white matter, compatible
with chronic microvascular ischemic disease. Moderate diffuse
cerebral atrophy with ex vacuo ventricular dilation. No
hydrocephalus. There are numerous punctate areas of susceptibility
artifact, predominantly involving supratentorial cortex, compatible
with prior microhemorrhages. More focal prior linear susceptibility
artifact involving the left external capsule, compatible with an
additional site of prior hemorrhage.

Skull and upper cervical spine: No focal marrow replacing lesion.

Sinuses/Orbits: Negative.

Other: No mastoid effusion.

MRA HEAD FINDINGS

No evidence of significant (greater than 50%) stenosis or occlusion
involving the anterior circulation. Similar appearance of a 3 mm
saccular aneurysm arising from the right aspect of the anterior
communicating artery complex (see series 3, image 87).

Moderate stenosis of the left P1 posterior cerebral artery. A left
posterior communicating artery is present. The right posterior
communicator artery is hypoplastic or absent.
IMPRESSION: 1. Acute infarct in the right corona radiata. Additional smaller
acute or subacute infarcts in the right periatrial and left parietal
white matter. Involvement of multiple vascular territories suggests
a central embolic origin.
2. Advanced chronic microvascular ischemic disease with numerous
infratentorial and supratentorial remote lacunar infarcts.
3. Numerous prior cortical microhemorrhages, which may relate to the
patient's atrial fibrillation (favored) or amyloid angiopathy.
Additional remote left basal ganglia hemorrhage.
4. Moderate stenosis of the left P1 PCA. Otherwise, no significant
(greater than 50%) arterial stenosis or occlusion identified.
5. Similar 3 mm saccular aneurysm arising from the right aspect of
the anterior communicating artery complex.

Critical Value/emergent results were called by telephone at the time
of interpretation on [DATE] at [DATE] to provider Dr. BETHELL, who
verbally acknowledged these results.

## 2020-05-16 IMAGING — MR MR HEAD W/O CM
8 of 10 series · 32 of 48 positions shown · non-contrast
Comparison: [DATE]

CLINICAL DATA: Stroke follow-up

EXAM:
MRI HEAD WITHOUT CONTRAST
MRA HEAD WITHOUT CONTRAST
TECHNIQUE: Multiplanar, multiecho pulse sequences of the brain and surrounding
structures were obtained without intravenous contrast. Angiographic
images of the head were obtained using MRA technique without
contrast.

[Series 4: DWI · axial · 3.0mm · 1.09mm/px · z∈[-83,+61]mm · 8 of 99 slices shown (1 of 4)]
[im 1/99]
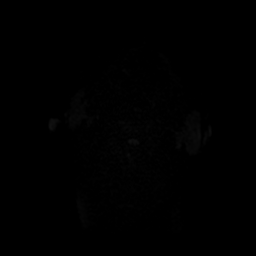
[im 15/99]
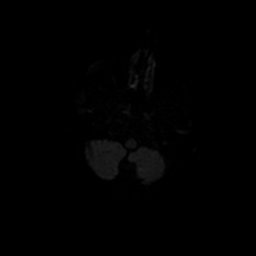
[im 29/99]
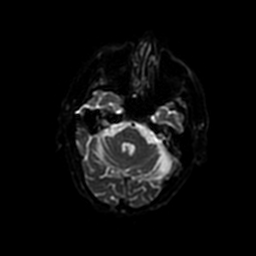
[im 43/99]
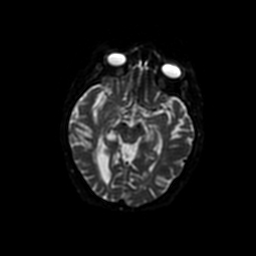
[im 57/99]
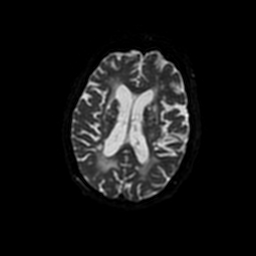
[im 71/99]
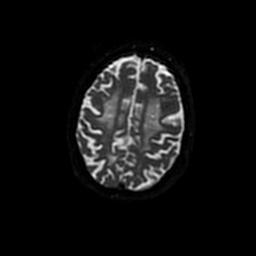
[im 85/99]
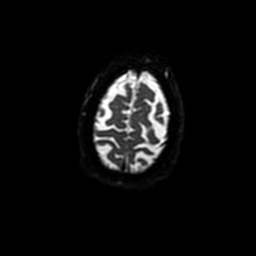
[im 99/99]
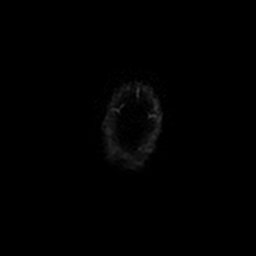

[Series 5: DWI · coronal · 5.0mm · 1.09mm/px · 6 of 74 slices shown (2 of 4)]
[im 1/74]
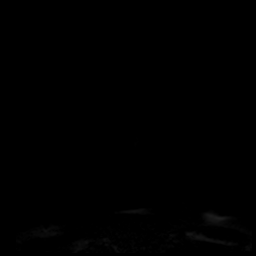
[im 15/74]
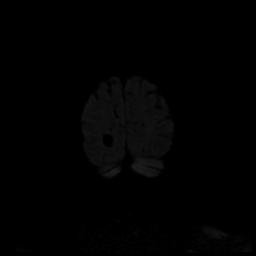
[im 30/74]
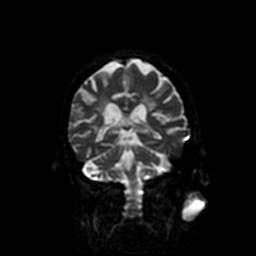
[im 44/74]
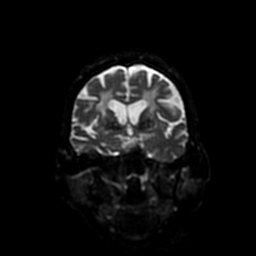
[im 59/74]
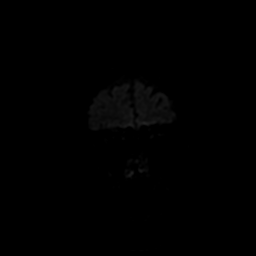
[im 74/74]
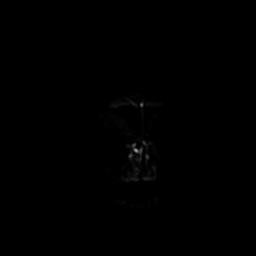

[Series 6: T1 · sagittal · 5.0mm · 0.47mm/px · 2 of 22 slices shown (1 of 2)]
[im 1/22]
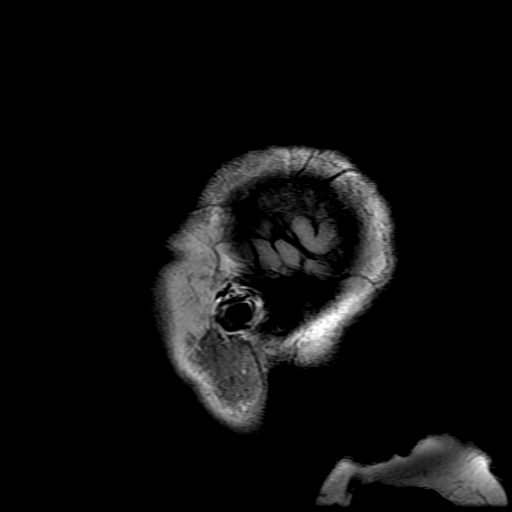
[im 22/22]
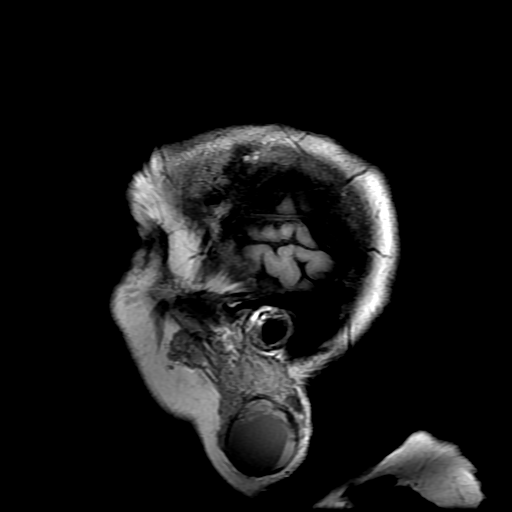

[Series 7: T2 · axial · 5.0mm · 0.47mm/px · z∈[-96,+39]mm · 2 of 24 slices shown]
[im 1/24]
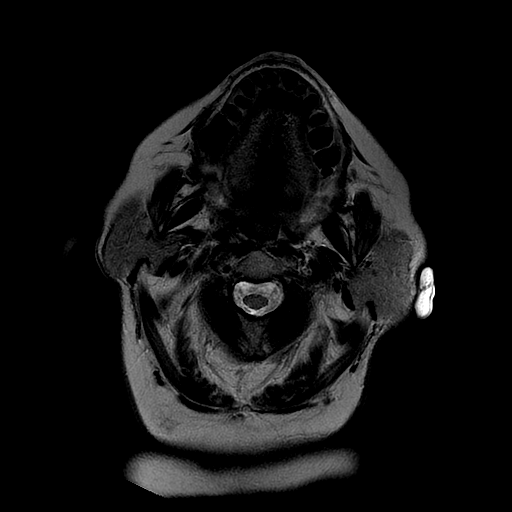
[im 24/24]
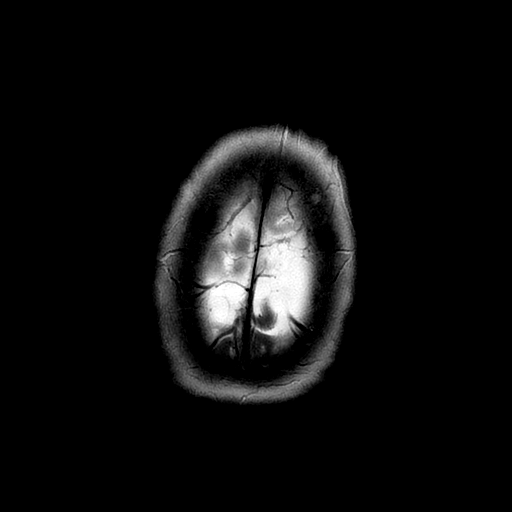

[Series 8: FLAIR · axial · 5.0mm · 0.47mm/px · z∈[-96,+39]mm · 2 of 24 slices shown]
[im 1/24]
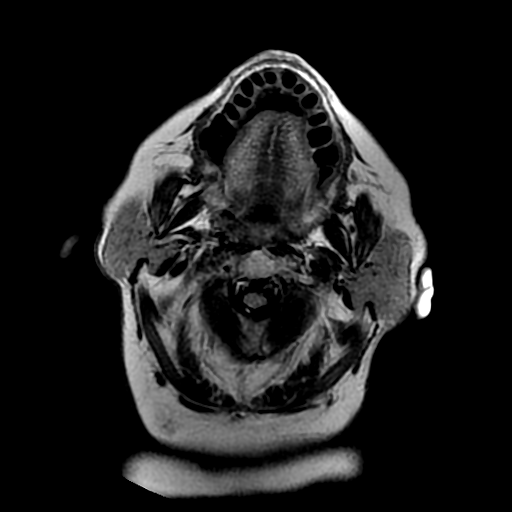
[im 24/24]
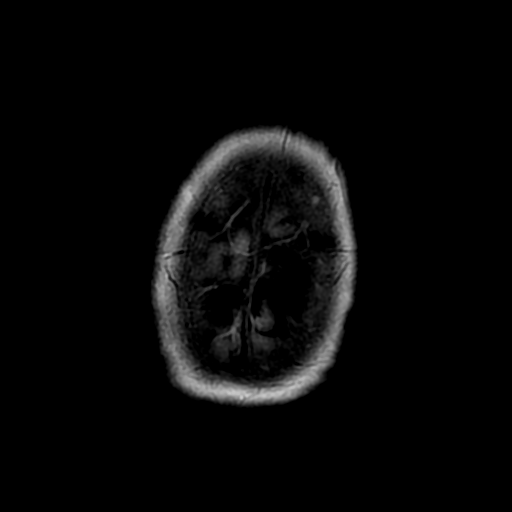

[Series 10: T1 · axial · 3.0mm · 0.43mm/px · z∈[-89,-7]mm · 5 of 100 slices shown (2 of 2)]
[im 1/100]
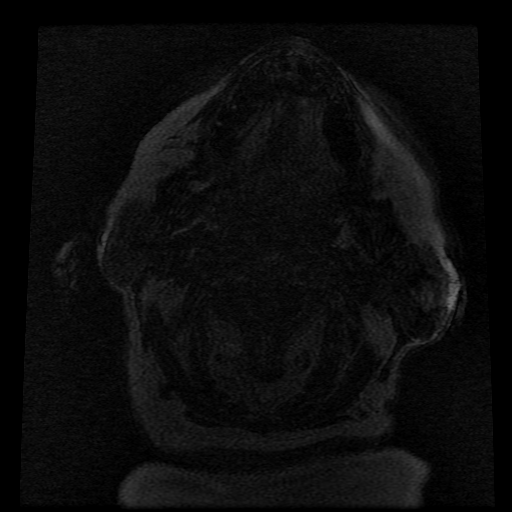
[im 15/100]
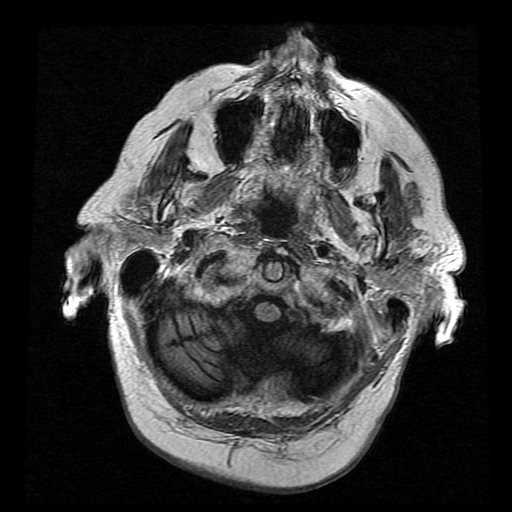
[im 29/100]
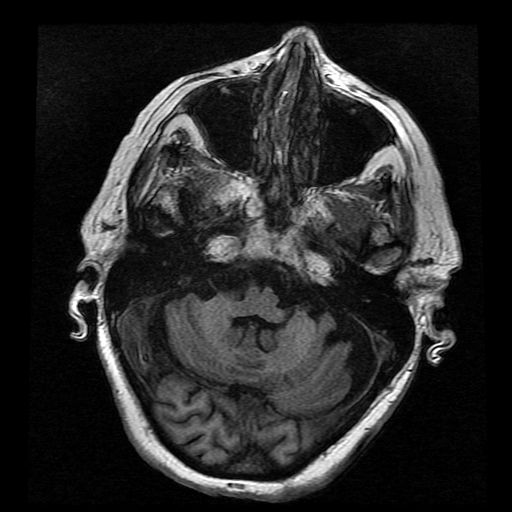
[im 43/100]
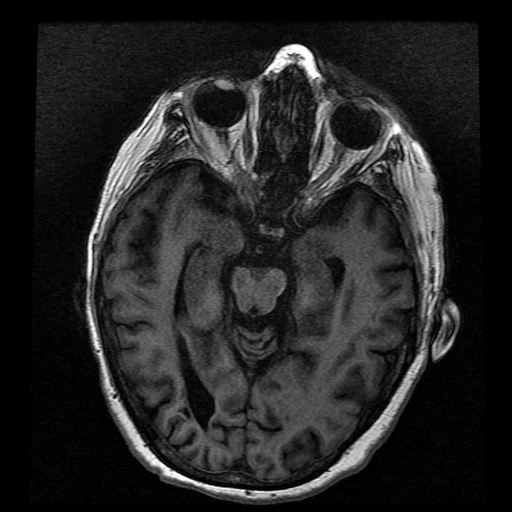
[im 57/100]
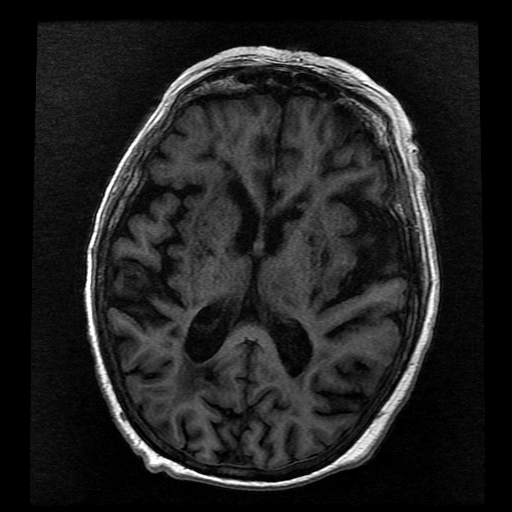

[Series 400: DWI · axial · 3.0mm · 1.09mm/px · z∈[-83,+61]mm · 4 of 50 slices shown (3 of 4)]
[im 1/50]
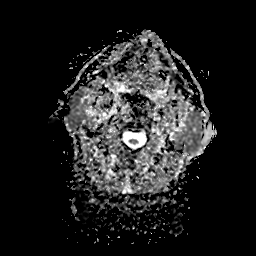
[im 17/50]
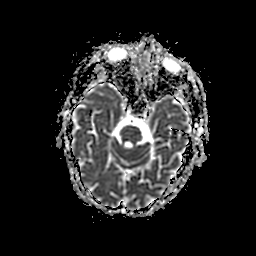
[im 33/50]
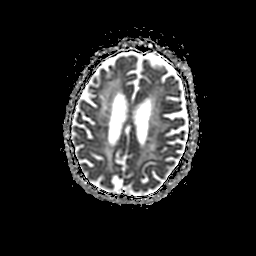
[im 50/50]
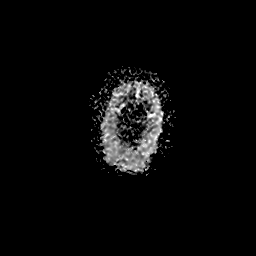

[Series 500: DWI · coronal · 5.0mm · 1.09mm/px · 3 of 35 slices shown (4 of 4)]
[im 1/35]
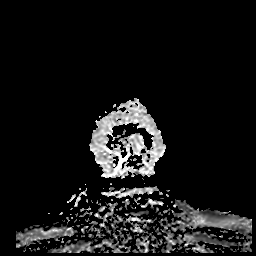
[im 18/35]
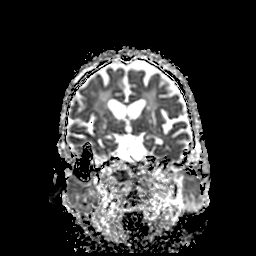
[im 35/35]
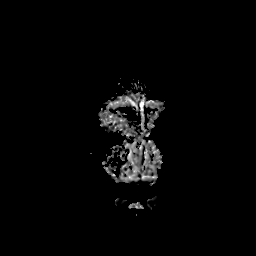

[32 of 48 positions shown; findings below may reference images not displayed]

FINDINGS: MRI HEAD FINDINGS

Brain: Restricted diffusion in the right frontal white matter
(corona radiata). Additional area of fainter restricted diffusion in
the right periatrial white matter and the left periatrial white
matter (serise 4, images 28 and 25). Mild associated edema without
substantial mass effect. There are numerous small remote lacunar
infarcts in bilateral basal ganglia and bilateral thalami. There are
additional small remote white matter infarcts bilaterally and small
bilateral cerebellar lacunar infarcts. There is moderate to advanced
suprimposed T2/FLAIR hyperintensity in the white matter, compatible
with chronic microvascular ischemic disease. Moderate diffuse
cerebral atrophy with ex vacuo ventricular dilation. No
hydrocephalus. There are numerous punctate areas of susceptibility
artifact, predominantly involving supratentorial cortex, compatible
with prior microhemorrhages. More focal prior linear susceptibility
artifact involving the left external capsule, compatible with an
additional site of prior hemorrhage.

Skull and upper cervical spine: No focal marrow replacing lesion.

Sinuses/Orbits: Negative.

Other: No mastoid effusion.

MRA HEAD FINDINGS

No evidence of significant (greater than 50%) stenosis or occlusion
involving the anterior circulation. Similar appearance of a 3 mm
saccular aneurysm arising from the right aspect of the anterior
communicating artery complex (see series 3, image 87).

Moderate stenosis of the left P1 posterior cerebral artery. A left
posterior communicating artery is present. The right posterior
communicator artery is hypoplastic or absent.
IMPRESSION: 1. Acute infarct in the right corona radiata. Additional smaller
acute or subacute infarcts in the right periatrial and left parietal
white matter. Involvement of multiple vascular territories suggests
a central embolic origin.
2. Advanced chronic microvascular ischemic disease with numerous
infratentorial and supratentorial remote lacunar infarcts.
3. Numerous prior cortical microhemorrhages, which may relate to the
patient's atrial fibrillation (favored) or amyloid angiopathy.
Additional remote left basal ganglia hemorrhage.
4. Moderate stenosis of the left P1 PCA. Otherwise, no significant
(greater than 50%) arterial stenosis or occlusion identified.
5. Similar 3 mm saccular aneurysm arising from the right aspect of
the anterior communicating artery complex.

Critical Value/emergent results were called by telephone at the time
of interpretation on [DATE] at [DATE] to provider Dr. BETHELL, who
verbally acknowledged these results.

## 2020-05-16 MED ORDER — THIAMINE HCL 100 MG/ML IJ SOLN
500.0000 mg | Freq: Three times a day (TID) | INTRAVENOUS | Status: AC
  Administered 2020-05-16 – 2020-05-17 (×6): 500 mg via INTRAVENOUS
  Filled 2020-05-16 (×7): qty 5

## 2020-05-16 MED ORDER — CYANOCOBALAMIN 1000 MCG/ML IJ SOLN
1000.0000 ug | Freq: Every day | INTRAMUSCULAR | Status: DC
  Administered 2020-05-16 – 2020-05-18 (×3): 1000 ug via INTRAMUSCULAR
  Filled 2020-05-16 (×3): qty 1

## 2020-05-16 MED ORDER — THIAMINE HCL 100 MG/ML IJ SOLN: 100.0000 mg | Freq: Every day | INTRAMUSCULAR | Status: DC

## 2020-05-16 MED ORDER — THIAMINE HCL 100 MG/ML IJ SOLN
250.0000 mg | Freq: Every day | INTRAVENOUS | Status: DC
  Filled 2020-05-16: qty 2.5

## 2020-05-16 MED ORDER — ADULT MULTIVITAMIN W/MINERALS CH
1.0000 | ORAL_TABLET | Freq: Every day | ORAL | Status: DC
  Administered 2020-05-16 – 2020-05-18 (×3): 1 via ORAL
  Filled 2020-05-16 (×3): qty 1

## 2020-05-16 MED ORDER — VITAMIN B-12 1000 MCG PO TABS: 1000.0000 ug | ORAL_TABLET | Freq: Every day | ORAL | Status: DC

## 2020-05-16 MED ORDER — ASPIRIN EC 81 MG PO TBEC
81.0000 mg | DELAYED_RELEASE_TABLET | Freq: Every day | ORAL | Status: DC
  Administered 2020-05-16 – 2020-05-18 (×3): 81 mg via ORAL
  Filled 2020-05-16 (×3): qty 1

## 2020-05-16 NOTE — TOC Progression Note (Signed)
Transition of Care [facility]) - Progression Note    Patient Details  Name: Kebra Lowrimore MRN: [id number] Date of Birth: 19-Sep-1945  Transition of Care [facility] CM/SW [employer], [place] Phone Number: 05/16/2020, 12:03 PM  Clinical Narrative:   CSW spoke with patient's son, [name], to discuss bed offers. CSW sent CMS list for family to review for SNF choice. Awaiting response from family with choice for SNF.    Expected Discharge Plan: [place] Barriers to Discharge: Continued Medical Work up  Expected Discharge Plan and Services Expected Discharge Plan: [place] Choice: [place] arrangements for the past 2 months: Single Family Home                                       Social Determinants of Health (SDOH) Interventions    Readmission Risk Interventions No flowsheet data found.

## 2020-05-16 NOTE — Progress Notes (Addendum)
STROKE TEAM PROGRESS NOTE   INTERVAL HISTORY No family at bedside. Patient remains encephalopathic.  I have personally reviewed history of presenting illness, electronic medical records and imaging films in PACS.  Patient has had a subacute decline in the condition with failure to thrive MRI scan was suboptimal due to motion artifacts and limited in all sequences not obtained but that shows ill-defined right parietal periventricular weak diffusion hyperintensity likely from acute to subacute infarct.  Vitals:   05/15/20 1928 05/16/20 0347 05/16/20 0800 05/16/20 1120  BP: 119/73 (!) 122/45 109/69 134/64  Pulse: 72 (!) 55 89 (!) 58  Resp: 18 18 16 16   Temp: 97.8 F (36.6 C) 98.1 F (36.7 C) 98.2 F (36.8 C) 98.1 F (36.7 C)  TempSrc: Oral Oral Oral Oral  SpO2: 98% 100% 99% 98%  Weight:      Height:       CBC:  Recent Labs  Lab 05/14/20 1605 05/14/20 1605 05/15/20 0415 05/16/20 0351  WBC 12.1*   < > 13.5* 10.2  NEUTROABS 10.5*  --   --   --   HGB 13.5   < > 14.6 12.1  HCT 41.2   < > 45.2 37.3  MCV 96.7   < > 98.5 98.4  PLT 269   < > 260 225   < > = values in this interval not displayed.   Basic Metabolic Panel:  Recent Labs  Lab 05/15/20 0415 05/16/20 0351  NA 138 142  K 5.6* 3.5  CL 106 111  CO2 18* 21*  GLUCOSE 165* 129*  BUN 20 17  CREATININE 1.25* 1.12*  CALCIUM 8.5* 8.4*  MG  --  2.0   Lipid Panel: No results for input(s): CHOL, TRIG, HDL, CHOLHDL, VLDL, LDLCALC in the last 168 hours.  HgbA1c:  Recent Labs  Lab 05/15/20 0747  HGBA1C 6.6*   IMAGING past 24 hours DG Swallowing Func-Speech Pathology  Result Date: 05/16/2020 Objective Swallowing Evaluation: Type of Study: MBS-Modified Barium Swallow Study  Patient Details Name: Melinda Olson MRN: [id number] Date of Birth: [birth year] Today's Date: 05/16/2020 Time: SLP Start Time (ACUTE ONLY): 7681 -SLP Stop Time (ACUTE ONLY): 0952 SLP Time Calculation (min) (ACUTE ONLY): 18 min Past Medical History: No past  medical history on file. Past Surgical History: No past surgical history on file. HPI: 74 y.o. female with medical history significant of no significant past medical history who was brought in by EMS due to generalized weakness over the last 3 days. Per chart patient apparently has been declining in the last few months. MRI multiple small areas of abnormal diffusivity within the right frontal white matter, right periatrial white matter and left temporal lobe.  Subjective: alert, cooperative, slow processing Assessment / Plan / Recommendation CHL IP CLINICAL IMPRESSIONS 05/16/2020 Clinical Impression Pt has a moderate oropharyngeal dysphagia with oral intake prolonged regardless of consistency. Melinda Olson has prolonged lingual rocking to try to propel boluses posteriorly, with reduced control and posterior loss into the pharynx as Melinda Olson tries to clear her oral cavity. There is diffuse oral residue that Melinda Olson eventually clears with cues. Premature spillage of thin liquids enters the laryngeal vestibule before the swallow with intermittent aspiration (cough only x1) and penetration to the vocal folds that cannot be cleared for the remainder of the study as her cued cough is weak. Melinda Olson has an anterior curvature of her spine that narrows her pharyngeal space and her pharyngeal squeeze and base of tongue retraction are weak, so there is residue in that  increases as consistencies become thicker. Despite an increase in residue Melinda Olson has improved airway protection with nectar thick liquids and purees. There is some trace but transient penetration with both textures. Pt also has an outpouching in her cervical esophagus in which barium collects (appearance of a diverticulum) and at times this results in mild backflow into the pyriform sinuses. Pt had a difficult time attempting strategies throughout the study, such as chin tuck and effort swallow, making it less likely to be utilized across meals. Recommend that pt remain on Dys 1 diet with  nectar thick liquids with additional SLP f/u to trial therapeutic exercise.  SLP Visit Diagnosis Dysphagia, oropharyngeal phase (R13.12) Attention and concentration deficit following -- Frontal lobe and executive function deficit following -- Impact on safety and function Moderate aspiration risk   CHL IP TREATMENT RECOMMENDATION 05/16/2020 Treatment Recommendations Therapy as outlined in treatment plan below   Prognosis 05/16/2020 Prognosis for Safe Diet Advancement Good Barriers to Reach Goals Cognitive deficits;Severity of deficits Barriers/Prognosis Comment -- CHL IP DIET RECOMMENDATION 05/16/2020 SLP Diet Recommendations Dysphagia 1 (Puree) solids;Nectar thick liquid Liquid Administration via Cup Medication Administration Crushed with puree Compensations Slow rate;Minimize environmental distractions;Small sips/bites;Clear throat intermittently Postural Changes Seated upright at 90 degrees;Remain semi-upright after after feeds/meals (Comment)   CHL IP OTHER RECOMMENDATIONS 05/16/2020 Recommended Consults -- Oral Care Recommendations Oral care BID Other Recommendations Order thickener from pharmacy;Prohibited food (jello, ice cream, thin soups);Remove water pitcher   CHL IP FOLLOW UP RECOMMENDATIONS 05/16/2020 Follow up Recommendations Skilled Nursing facility   [facility] IP FREQUENCY AND DURATION 05/16/2020 Speech Therapy Frequency (ACUTE ONLY) min 2x/week Treatment Duration 2 weeks      CHL IP ORAL PHASE 05/16/2020 Oral Phase Impaired Oral - Pudding Teaspoon -- Oral - Pudding Cup -- Oral - Honey Teaspoon -- Oral - Honey Cup -- Oral - Nectar Teaspoon -- Oral - Nectar Cup Weak lingual manipulation;Lingual pumping;Reduced posterior propulsion;Lingual/palatal residue;Delayed oral transit;Decreased bolus cohesion;Premature spillage Oral - Nectar Straw -- Oral - Thin Teaspoon -- Oral - Thin Cup Weak lingual manipulation;Lingual pumping;Reduced posterior propulsion;Lingual/palatal residue;Delayed oral transit;Decreased bolus  cohesion;Premature spillage Oral - Thin Straw -- Oral - Puree Weak lingual manipulation;Lingual pumping;Reduced posterior propulsion;Lingual/palatal residue;Delayed oral transit;Decreased bolus cohesion;Premature spillage Oral - Mech Soft -- Oral - Regular -- Oral - Multi-Consistency -- Oral - Pill -- Oral Phase - Comment --  CHL IP PHARYNGEAL PHASE 05/16/2020 Pharyngeal Phase Impaired Pharyngeal- Pudding Teaspoon -- Pharyngeal -- Pharyngeal- Pudding Cup -- Pharyngeal -- Pharyngeal- Honey Teaspoon -- Pharyngeal -- Pharyngeal- Honey Cup -- Pharyngeal -- Pharyngeal- Nectar Teaspoon -- Pharyngeal -- Pharyngeal- Nectar Cup Reduced pharyngeal peristalsis;Reduced anterior laryngeal mobility;Reduced tongue base retraction;Pharyngeal residue - valleculae;Pharyngeal residue - pyriform;Penetration/Aspiration before swallow Pharyngeal Material enters airway, remains ABOVE vocal cords then ejected out Pharyngeal- Nectar Straw -- Pharyngeal -- Pharyngeal- Thin Teaspoon -- Pharyngeal -- Pharyngeal- Thin Cup Reduced pharyngeal peristalsis;Reduced anterior laryngeal mobility;Reduced tongue base retraction;Pharyngeal residue - valleculae;Pharyngeal residue - pyriform;Penetration/Aspiration before swallow Pharyngeal Material enters airway, passes BELOW cords without attempt by patient to eject out (silent aspiration);Material enters airway, passes BELOW cords and not ejected out despite cough attempt by patient Pharyngeal- Thin Straw -- Pharyngeal -- Pharyngeal- Puree Reduced pharyngeal peristalsis;Reduced anterior laryngeal mobility;Reduced tongue base retraction;Pharyngeal residue - valleculae;Pharyngeal residue - pyriform;Penetration/Aspiration before swallow Pharyngeal Material enters airway, remains ABOVE vocal cords then ejected out Pharyngeal- Mechanical Soft -- Pharyngeal -- Pharyngeal- Regular -- Pharyngeal -- Pharyngeal- Multi-consistency -- Pharyngeal -- Pharyngeal- Pill -- Pharyngeal -- Pharyngeal Comment --  CHL IP  CERVICAL  ESOPHAGEAL PHASE 05/16/2020 Cervical Esophageal Phase Impaired Pudding Teaspoon -- Pudding Cup -- Honey Teaspoon -- Honey Cup -- Nectar Teaspoon -- Nectar Cup Esophageal backflow into the pharynx Nectar Straw -- Thin Teaspoon -- Thin Cup Esophageal backflow into the pharynx Thin Straw -- Puree Esophageal backflow into the pharynx Mechanical Soft -- Regular -- Multi-consistency -- Pill -- Cervical Esophageal Comment -- Osie Bond., M.A. [place] [facility] Pager [phone] Office [phone] 05/16/2020, 11:02 AM              ECHOCARDIOGRAM COMPLETE  Result Date: 05/15/2020    ECHOCARDIOGRAM REPORT   Patient Name:   BRITTA LOUTH Date of Exam: 05/15/2020 Medical Rec #:  [id number]     Height:       65.0 in Accession #:    [id number]    Weight:       175.0 lb Date of Birth:  02/20/1946     BSA:          1.869 m Patient Age:    40 years      BP:           119/79 mmHg Patient Gender: F             HR:           75 bpm. Exam Location:  Inpatient Procedure: 2D Echo Indications:    stroke 434.91  History:        Patient has no prior history of Echocardiogram examinations.                 Risk Factors:Current Smoker.  Sonographer:    Jannett Celestine RDCS (AE) Referring Phys: [id number] [place]  Sonographer Comments: restricted mobility IMPRESSIONS  1. Left ventricular ejection fraction, by estimation, is 65 to 70%. The left ventricle has normal function. The left ventricle has no regional wall motion abnormalities. There is severe concentric left ventricular hypertrophy. Left ventricular diastolic  parameters are consistent with Grade I diastolic dysfunction (impaired relaxation).  2. Right ventricular systolic function is normal. The right ventricular size is normal. Tricuspid regurgitation signal is inadequate for assessing PA pressure.  3. The mitral valve is normal in structure. No evidence of mitral valve regurgitation.  4. The aortic valve is normal in structure. Aortic valve regurgitation  is mild to moderate. FINDINGS  Left Ventricle: Left ventricular ejection fraction, by estimation, is 65 to 70%. The left ventricle has normal function. The left ventricle has no regional wall motion abnormalities. The left ventricular internal cavity size was normal in size. There is  severe concentric left ventricular hypertrophy. Left ventricular diastolic parameters are consistent with Grade I diastolic dysfunction (impaired relaxation). Indeterminate filling pressures. Right Ventricle: The right ventricular size is normal. No increase in right ventricular wall thickness. Right ventricular systolic function is normal. Tricuspid regurgitation signal is inadequate for assessing PA pressure. Left Atrium: Left atrial size was normal in size. Right Atrium: Right atrial size was normal in size. Pericardium: There is no evidence of pericardial effusion. Mitral Valve: The mitral valve is normal in structure. No evidence of mitral valve regurgitation. Tricuspid Valve: The tricuspid valve is normal in structure. Tricuspid valve regurgitation is trivial. Aortic Valve: The aortic valve is normal in structure. Aortic valve regurgitation is mild to moderate. Aortic regurgitation PHT measures 411 msec. Pulmonic Valve: The pulmonic valve was grossly normal. Pulmonic valve regurgitation is not visualized. Aorta: The aortic root and ascending aorta are structurally normal, with no evidence of dilitation. Venous: The inferior vena cava  was not well visualized. IAS/Shunts: No atrial level shunt detected by color flow Doppler.  LEFT VENTRICLE PLAX 2D LVIDd:         3.10 cm  Diastology LVIDs:         2.25 cm  LV e' lateral:   3.15 cm/s LV PW:         1.70 cm  LV E/e' lateral: 13.2 LV IVS:        1.70 cm  LV e' medial:    3.15 cm/s LVOT diam:     2.00 cm  LV E/e' medial:  13.2 LV SV:         57 LV SV Index:   31 LVOT Area:     3.14 cm  LEFT ATRIUM             Index       RIGHT ATRIUM           Index LA diam:        2.90 cm 1.55 cm/m   RA Area:     13.70 cm LA Vol (A2C):   25.8 ml 13.80 ml/m RA Volume:   30.20 ml  16.16 ml/m LA Vol (A4C):   18.4 ml 9.84 ml/m LA Biplane Vol: 21.8 ml 11.66 ml/m  AORTIC VALVE LVOT Vmax:   98.60 cm/s LVOT Vmean:  76.100 cm/s LVOT VTI:    0.183 m AI PHT:      411 msec  AORTA Ao Root diam: 3.30 cm MITRAL VALVE MV Area (PHT): 3.19 cm    SHUNTS MV Decel Time: 238 msec    Systemic VTI:  0.18 m MV E velocity: 41.60 cm/s  Systemic Diam: 2.00 cm MV A velocity: 84.40 cm/s MV E/A ratio:  0.49 Mihai Croitoru MD Electronically signed by Sanda Klein MD Signature Date/Time: 05/15/2020/6:14:42 PM    Final     PHYSICAL EXAM Frail malnourished looking cachectic elderly Caucasian lady not in distress. . Afebrile. Head is nontraumatic. Neck is supple without bruit.    Cardiac exam no murmur or gallop. Lungs are clear to auscultation. Distal pulses are well felt.  Neurological Exam ;  Awake  Alert disoriented x 3.  Slow hesitant nonfluent speech.  Follows simple midline and one-step commands.  Diminished attention, registration and recall.  Eye movements full without nystagmus.fundi were not visualized. Vision acuity and fields appear normal. Hearing is normal. Palatal movements are normal. Face asymmetric.  With left lower facial weakness.  Tongue midline. Mild left hemiparesis 4/5 with weakness of left grip intrinsic hand muscles and orbits right over left upper extremity.  Mild left hip flexor and ankle dorsiflexor weakness.  Normal sensation. Gait deferred.  ASSESSMENT/PLAN Ms. Melinda Olson is a 74 y.o. female with no significant past medical history.  Patient was brought to the ED by EMS secondary to noting generalized weakness and reduced mobility over the last 7 months along and decline with mentation.  Patient has not seen a provider in over 25 years.  On arrival to the ED patient was able to speak to the son and husband, they stated that Melinda Olson has been declining for several months, now however for the past 3 days  Melinda Olson was unable to walk to which Melinda Olson usually uses a walker at home.  For that reason patient was admitted for weakness and failure to thrive. 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  (  motion degraded)Truncated examination due to patient altered mental status and inability to cooperate with the technologist's instructions 2. Multiple small areas of abnormal diffusivity within the rightfrontal white matter, right periatrial white matter and lefttemporal lobe. These are most likely small infarcts, possibly from an embolic source. Repeat MRI: Pending MRA no LVO; repeat d/t degraded images  Repeat MRA pending Carotid Doppler  Right Carotid: Velocities in the right ICA are consistent with a 1-39%  stenosis.   Left Carotid: Velocities in the left ICA are consistent with a 60-79%  stenosis.                The ECA appears >50% stenosed.   Vertebrals: Right vertebral artery demonstrates antegrade flow. Left  vertebral              artery demonstrates retrograde flow. 2D Echo   1. Left ventricular ejection fraction, by estimation, is 65 to 70%. The  left ventricle has normal function. The left ventricle has no regional  wall motion abnormalities. There is severe concentric left ventricular  hypertrophy. Left ventricular diastolic   parameters are consistent with Grade I diastolic dysfunction (impaired  relaxation).   2. Right ventricular systolic function is normal. The right ventricular  size is normal. Tricuspid regurgitation signal is inadequate for assessing  PA pressure.   3. The mitral valve is normal in structure. No evidence of mitral valve  regurgitation.   4. The aortic valve is normal in structure. Aortic valve regurgitation is  mild to moderate.  LDL direct: 122.8 HgbA1c 6.6 VTE prophylaxis - lovenox    Diet   DIET - DYS 1 Room service appropriate? No;  Fluid consistency: Nectar Thick   No antithrombotic prior to admission, now on aspirin 81 mg daily.  Therapy recommendations:  OT: SNF; SLP: nectar thick liquids Disposition:  pending  Hypertension Home meds:  none Stable Permissive hypertension (OK if < 220/120) but gradually normalize in 5-7 days Long-term BP goal normotensive  Hyperlipidemia Home meds:  none LDL , goal < 70 Add lipitor 40 mg daily  Continue statin at discharge  Diabetes type II Controlled Home meds:  none HgbA1c 6.6, goal < 7.0 CBGs Recent Labs    05/16/20 0624 05/16/20 0747 05/16/20 1146  GLUCAP 130* 110* 137*     Other Stroke Risk Factors Cigarette smoker advised to stop smoking Obesity, Body mass index is 29.12 kg/m., recommend weight loss, diet and exercise as appropriate   Other Active Problems Replace B12 Treat UTI per primary team  Hospital day # 2  Laurey Morale, MSN, NP-C [facility] [phone]  I have personally obtained history,examined this patient, reviewed notes, independently viewed imaging studies, participated in medical decision making and plan of care.ROS completed by me personally and pertinent positives fully documented  I have made any additions or clarifications directly to the above note. Agree with note above.  Melinda Olson presented with subacute failure to thrive and encephalopathy likely multifactorial due to combination of vitamin B12 deficiencies and ongoing UTI.  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  Recommend repeat MRI scan complete study.  Asymptomatic moderate left carotid stenosis can be managed medically at this time.  Aspirin for stroke prevention and aggressive risk factor modification.  Treatment of UTI and replacement of vitamin B 12 as per primary team discussed with Dr. Reesa Chew.  Greater than 50% time during this 35-minute visit was  spent on counseling and coordination of care and discussion with care team. Antony Contras, MD Medical Director [place] Pager: [phone] 05/16/2020 4:48 PM  To contact Stroke Continuity provider, please refer to [email or web address] After hours, contact General Neurology

## 2020-05-16 NOTE — Progress Notes (Signed)
Modified Barium Swallow Progress Note  Patient Details  Name: Melinda Olson MRN: [id number] Date of Birth: [birth year]  Today's Date: 05/16/2020  Modified Barium Swallow completed.  Full report located under Chart Review in the Imaging Section.  Brief recommendations include the following:  Clinical Impression   Pt has a moderate oropharyngeal dysphagia with oral intake prolonged regardless of consistency. She has prolonged lingual rocking to try to propel boluses posteriorly, with reduced control and posterior loss into the pharynx as she tries to clear her oral cavity. There is diffuse oral residue that she eventually clears with cues. Premature spillage of thin liquids enters the laryngeal vestibule before the swallow with intermittent aspiration (cough only x1) and penetration to the vocal folds that cannot be cleared for the remainder of the study as her cued cough is weak. She has an anterior curvature of her spine that narrows her pharyngeal space and her pharyngeal squeeze and base of tongue retraction are weak, so there is residue in that increases as consistencies become thicker. Despite an increase in residue she has improved airway protection with nectar thick liquids and purees. There is some trace but transient penetration with both textures. Pt also has an outpouching in her cervical esophagus in which barium collects (appearance of a diverticulum) and at times this results in mild backflow into the pyriform sinuses. Pt had a difficult time attempting strategies throughout the study, such as chin tuck and effort swallow, making it less likely to be utilized across meals. Recommend that pt remain on Dys 1 diet with nectar thick liquids with additional SLP f/u to trial therapeutic exercise.    Swallow Evaluation Recommendations       SLP Diet Recommendations: Dysphagia 1 (Puree) solids;Nectar thick liquid   Liquid Administration via: Cup   Medication Administration: Crushed with  puree   Supervision: Patient able to self feed;Full supervision/cueing for compensatory strategies   Compensations: Slow rate;Minimize environmental distractions;Small sips/bites;Clear throat intermittently   Postural Changes: Seated upright at 90 degrees;Remain semi-upright after after feeds/meals (Comment)   Oral Care Recommendations: Oral care BID   Other Recommendations: Order thickener from pharmacy;Prohibited food (jello, ice cream, thin soups);Remove water pitcher    Osie Bond., M.A. [place] [facility] Pager [phone] Office [phone]  05/16/2020,10:53 AM

## 2020-05-16 NOTE — Evaluation (Signed)
Occupational Therapy Evaluation Patient Details Name: Melinda Olson MRN: [id number] DOB: 08-12-1946 Today's Date: 05/16/2020    History of Present Illness 74 y.o. female with medical history significant of no significant past medical history who was brought in by EMS due to generalized weakness over the last 3 days.  Patient apparently has been declining in the last few months but got worse in the last 3 days to where she could not get out of bed. PT also noted to have cognitive decline. Urinalysis reveals UTI. MRI multiple small areas of abnormal diffusivity within the right frontal white matter, right periatrial white matter and left temporal lobe.    Clinical Impression   PTA patient uses a rollator for mobility with independence, but requires some assist for ADLs.  Admitted for above and limited by problem list below, including generalized weakness, impaired cognition, decreased activity tolerance and impaired balance. Pt oriented x 3 (time with choices), following 1 step commands given increased time but poor safety awareness, awareness to deficits and problem solving. Patient currently requires max assist for bed mobility, mod assist for sit to stand transfers, min -mod assist for UB ADLs and total assist for LB ADLs.  Patient will benefit from continued OT services while admitted and after dc at SNF level to optimize independence, safety with ADLs, mobility.  Will follow acutely.     Follow Up Recommendations  SNF;Supervision/Assistance - 24 hour    Equipment Recommendations  3 in 1 bedside commode    Recommendations for Other Services       Precautions / Restrictions Precautions Precautions: Fall Restrictions Weight Bearing Restrictions: No      Mobility Bed Mobility Overal bed mobility: Needs Assistance Bed Mobility: Supine to Sit;Sit to Supine     Supine to sit: Max assist Sit to supine: Max assist   General bed mobility comments: pt able to initate movement of LEs but  otherwise needs assist for trunk and scooting   Transfers Overall transfer level: Needs assistance   Transfers: Sit to/from Stand Sit to Stand: Mod assist         General transfer comment: mod assist to power up and steady from EOB    Balance Overall balance assessment: Needs assistance Sitting-balance support: No upper extremity supported;Feet supported Sitting balance-Leahy Scale: Fair Sitting balance - Comments: able to maintain static sitting with min guard to close supervision    Standing balance support: Bilateral upper extremity supported;During functional activity Standing balance-Leahy Scale: Poor Standing balance comment: relies on external support, posterior lean                           ADL either performed or assessed with clinical judgement   ADL Overall ADL's : Needs assistance/impaired     Grooming: Minimal assistance;Sitting   Upper Body Bathing: Minimal assistance;Sitting   Lower Body Bathing: Maximal assistance;Sit to/from stand   Upper Body Dressing : Minimal assistance;Sitting   Lower Body Dressing: Total assistance;Sit to/from Health and safety inspector Details (indicate cue type and reason): deferred         Functional mobility during ADLs: Maximal assistance General ADL Comments: pt limited by impaired balance, decreased activity tolerance, generalized weakness and impaired balance     Vision         Perception     Praxis      Pertinent Vitals/Pain Pain Assessment: Faces Faces Pain Scale: No hurt Pain Intervention(s): Monitored during session  Hand Dominance     Extremity/Trunk Assessment Upper Extremity Assessment Upper Extremity Assessment: Generalized weakness   Lower Extremity Assessment Lower Extremity Assessment: Defer to PT evaluation   Cervical / Trunk Assessment Cervical / Trunk Assessment: Kyphotic   Communication Communication Communication: No difficulties   Cognition Arousal/Alertness:  Awake/alert Behavior During Therapy: Flat affect Overall Cognitive Status: Impaired/Different from baseline Area of Impairment: Orientation;Attention;Memory;Following commands;Safety/judgement;Awareness;Problem solving                 Orientation Level: Disoriented to;Situation Current Attention Level: Focused Memory: Decreased recall of precautions;Decreased short-term memory Following Commands: Follows one step commands consistently;Follows one step commands with increased time Safety/Judgement: Decreased awareness of safety;Decreased awareness of deficits Awareness: Intellectual Problem Solving: Slow processing;Requires verbal cues;Decreased initiation;Difficulty sequencing;Requires tactile cues General Comments: patient oriented to self, place and time (with choices), follows simple commands with increased time and but decreased awareness to deficits, safety    General Comments  VSS    Exercises     Shoulder Instructions      Home Living Family/patient expects to be discharged to:: Private residence Living Arrangements: Spouse/significant other Available Help at Discharge: Family;Available 24 hours/day Type of Home: House Home Access: Ramped entrance     Home Layout: One level     Bathroom Shower/Tub: Teacher, early years/pre: Handicapped height     Home Equipment: Environmental consultant - 4 wheels          Prior Functioning/Environment Level of Independence: Needs assistance  Gait / Transfers Assistance Needed: pt ambulates independently with use of Rollator ADL's / Homemaking Assistance Needed: requries assistance for most ADLs including bathing and dressing            OT Problem List: Decreased strength;Decreased activity tolerance;Impaired balance (sitting and/or standing);Decreased cognition;Decreased safety awareness;Decreased knowledge of use of DME or AE;Decreased knowledge of precautions;Decreased coordination      OT Treatment/Interventions:  Self-care/ADL training;DME and/or AE instruction;Balance training;Patient/family education;Cognitive remediation/compensation;Therapeutic activities;Therapeutic exercise    OT Goals(Current goals can be found in the care plan section) Acute Rehab OT Goals Patient Stated Goal: to move better OT Goal Formulation: With patient Time For Goal Achievement: 05/30/20 Potential to Achieve Goals: Good  OT Frequency: Min 2X/week   Barriers to D/C:            Co-evaluation              AM-PAC OT "6 Clicks" Daily Activity     Outcome Measure Help from another person eating meals?: A Lot Help from another person taking care of personal grooming?: A Little Help from another person toileting, which includes using toliet, bedpan, or urinal?: Total Help from another person bathing (including washing, rinsing, drying)?: A Lot Help from another person to put on and taking off regular upper body clothing?: A Lot Help from another person to put on and taking off regular lower body clothing?: Total 6 Click Score: 11   End of Session Equipment Utilized During Treatment: Gait belt Nurse Communication: Mobility status  Activity Tolerance: Patient tolerated treatment well Patient left: in bed;with call bell/phone within reach;with bed alarm set  OT Visit Diagnosis: Other abnormalities of gait and mobility (R26.89);Muscle weakness (generalized) (M62.81);Other symptoms and signs involving cognitive function                Time: 1209-1227 OT Time Calculation (min): 18 min Charges:  OT General Charges $OT Visit: 1 Visit OT Evaluation $OT Eval Moderate Complexity: 1 Mod  Niyla Marone B, OT Acute Rehabilitation  Services Pager [phone] Office [place] 05/16/2020, 1:25 PM

## 2020-05-16 NOTE — Progress Notes (Signed)
PROGRESS NOTE    Melinda Olson  IOE:[id number] DOB: [birth year] DOA: 05/14/2020 PCP: Patient, No Pcp Per   Brief Narrative:  74 year old with no significant past medical history admitted for generalized weakness and reduced mobility for the past several months and decline in mentation.  She was admitted to the hospital for further care and management.   Assessment & Plan:   Principal Problem:   AMS (altered mental status) Active Problems:   UTI (urinary tract infection)   Leucocytosis   AKI (acute kidney injury) ([place])   Acute CVA (cerebrovascular accident) ([place])  Acute encephalopathy, multifactorial Acute multiple small infarcts -MRI brain-truncated study due to altered mental status, multiple areas of abnormal diffusivity concerns for small infarcts -MRA-no acute vessel occlusion, moderate stenosis of the posterior circulation, 3 mm saccular aneurysm -A1c 6.6, LDL 122 -Aspirin 81 mg daily ordered, Lipitor 40 mg daily. -Neurology following -Accu-Cheks, TSH normal -Currently on D5 half-normal saline  Urinary tract infection without hematuria -Urine cultures-growing gram-negative rods, speciation pending -Empiric Rocephin  Vitamin B12 and folate deficiency -Supplements have been ordered.  Elevated ammonia -On lactulose, titrate to 2-3 bowel movements daily   DVT prophylaxis: Lovenox Code Status: Full  Family Communication: Son was updated by me yesterday.  Status is: Inpatient  Remains inpatient appropriate because:Inpatient level of care appropriate due to severity of illness   Dispo: The patient is from: Home              Anticipated d/c is to: Home              Anticipated d/c date is: 1-2 days              Patient currently is not medically stable to d/c.  Still slightly confused, slow to improve.  Awaiting culture data.  Hopefully discharge in next 1-2 days  Body mass index is 29.12 kg/m.     Subjective: Patient is alert to name this morning.  Limit more  responsive and answering basic questions but confused about place and time. Overall denies any complaints to me Tells me she has had mass in the left side of the neck for several years and has not seek any sort of medical attention.  Review of Systems Otherwise negative except as per HPI, including: General: Denies fever, chills, night sweats or unintended weight loss. Resp: Denies cough, wheezing, shortness of breath. Cardiac: Denies chest pain, palpitations, orthopnea, paroxysmal nocturnal dyspnea. GI: Denies abdominal pain, nausea, vomiting, diarrhea or constipation GU: Denies dysuria, frequency, hesitancy or incontinence MS: Denies muscle aches, joint pain or swelling Neuro: Denies headache, neurologic deficits (focal weakness, numbness, tingling), abnormal gait Psych: Denies anxiety, depression, SI/HI/AVH Skin: Denies new rashes or lesions ID: Denies sick contacts, exotic exposures, travel  Examination: Constitutional: Not in acute distress, elderly frail Respiratory: Clear to auscultation bilaterally Cardiovascular: Normal sinus rhythm, no rubs Abdomen: Nontender nondistended good bowel sounds Musculoskeletal: No edema noted Skin: Soft golf ball sized mass in the left side of her neck.  No obvious evidence of infection. Neurologic: CN 2-12 grossly intact.  And nonfocal Psychiatric: Alert to name only, poor judgment and insight  Objective: Vitals:   05/15/20 1600 05/15/20 1928 05/16/20 0347 05/16/20 0800  BP: 119/72 119/73 (!) 122/45 109/69  Pulse: 65 72 (!) 55 89  Resp: 16 18 18 16   Temp: 97.9 F (36.6 C) 97.8 F (36.6 C) 98.1 F (36.7 C) 98.2 F (36.8 C)  TempSrc: Oral Oral Oral Oral  SpO2: 98% 98% 100% 99%  Weight:  Height:        Intake/Output Summary (Last 24 hours) at 05/16/2020 1123 Last data filed at 05/16/2020 0845 Gross per 24 hour  Intake 120 ml  Output --  Net 120 ml   Filed Weights   05/14/20 2117  Weight: 79.4 kg     Data Reviewed:    CBC: Recent Labs  Lab 05/14/20 1605 05/15/20 0415 05/16/20 0351  WBC 12.1* 13.5* 10.2  NEUTROABS 10.5*  --   --   HGB 13.5 14.6 12.1  HCT 41.2 45.2 37.3  MCV 96.7 98.5 98.4  PLT 269 260 540   Basic Metabolic Panel: Recent Labs  Lab 05/14/20 1605 05/15/20 0415 05/16/20 0351  NA 143 138 142  K 3.9 5.6* 3.5  CL 110 106 111  CO2 24 18* 21*  GLUCOSE 137* 165* 129*  BUN 17 20 17   CREATININE 1.17* 1.25* 1.12*  CALCIUM 8.5* 8.5* 8.4*  MG  --   --  2.0   GFR: Estimated Creatinine Clearance: 45.2 mL/min (A) (by C-G formula based on SCr of 1.12 mg/dL (H)). Liver Function Tests: Recent Labs  Lab 05/14/20 1605 05/15/20 0415  AST 25 34  ALT 13 10  ALKPHOS 114 100  BILITOT 0.6 1.0  PROT 7.0 6.7  ALBUMIN 3.7 3.5   No results for input(s): LIPASE, AMYLASE in the last 168 hours. Recent Labs  Lab 05/15/20 0747  AMMONIA 41*   Coagulation Profile: No results for input(s): INR, PROTIME in the last 168 hours. Cardiac Enzymes: No results for input(s): CKTOTAL, CKMB, CKMBINDEX, TROPONINI in the last 168 hours. BNP (last 3 results) No results for input(s): PROBNP in the last 8760 hours. HbA1C: Recent Labs    05/15/20 0747  HGBA1C 6.6*   CBG: Recent Labs  Lab 05/15/20 1705 05/15/20 2046 05/16/20 0624 05/16/20 0747  GLUCAP 145* 139* 130* 110*   Lipid Profile: Recent Labs    05/15/20 0747  LDLDIRECT 122.8*   Thyroid Function Tests: Recent Labs    05/15/20 0747  TSH 3.535   Anemia Panel: Recent Labs    05/15/20 0747 05/15/20 1106  VITAMINB12 131* 133*  FOLATE 3.8*  --    Sepsis Labs: No results for input(s): PROCALCITON, LATICACIDVEN in the last 168 hours.  Recent Results (from the past 240 hour(s))  Urine culture     Status: Abnormal (Preliminary result)   Collection Time: 05/14/20  6:00 PM   Specimen: Urine, Catheterized  Result Value Ref Range Status   Specimen Description   Final    URINE, CATHETERIZED Performed at [facility] [address] [place], [place] [address]    Special Requests   Final    NONE Performed at [facility], [place] [address] [place], [place] [address]    Culture (A)  Final    >=100,000 COLONIES/mL GRAM NEGATIVE RODS IDENTIFICATION AND SUSCEPTIBILITIES TO FOLLOW Performed at [facility], [place] [address], [place],  [address]    Report Status PENDING  Incomplete  SARS Coronavirus 2 by RT PCR (hospital order, performed in [facility] hospital lab) Nasopharyngeal Nasopharyngeal Swab     Status: None   Collection Time: 05/14/20  8:52 PM   Specimen: Nasopharyngeal Swab  Result Value Ref Range Status   SARS Coronavirus 2 NEGATIVE NEGATIVE Final    Comment: (NOTE) SARS-CoV-2 target nucleic acids are NOT DETECTED.  The SARS-CoV-2 RNA is generally detectable in upper and lower respiratory specimens during the acute phase of infection. The lowest  concentration of SARS-CoV-2 viral copies this assay can detect is 250 copies / mL. A negative result does not preclude SARS-CoV-2 infection and should not be used as the sole basis for treatment or other patient management decisions.  A negative result may occur with improper specimen collection / handling, submission of specimen other than nasopharyngeal swab, presence of viral mutation(s) within the areas targeted by this assay, and inadequate number of viral copies (<250 copies / mL). A negative result must be combined with clinical observations, patient history, and epidemiological information.  Fact Sheet for Patients:   StrictlyIdeas.no  Fact Sheet for Healthcare Providers: BankingDealers.co.za  This test is not yet approved or  cleared by the [place] FDA and has been authorized for detection and/or diagnosis of SARS-CoV-2 by FDA under an Emergency Use Authorization (EUA).  This EUA will remain in effect (meaning this test can be used) for the  duration of the COVID-19 declaration under Section 564(b)(1) of the Act, 21 U.S.C. section 360bbb-3(b)(1), unless the authorization is terminated or revoked sooner.  Performed at [facility], [place] [address], [place], [place] [address]          Radiology Studies: CT Head Wo Contrast  Result Date: 05/14/2020 CLINICAL DATA:  Delirium. Additional provided: Increasing generalized weakness and reduced mobility for several months EXAM: CT HEAD WITHOUT CONTRAST TECHNIQUE: Contiguous axial images were obtained from the base of the skull through the vertex without intravenous contrast. COMPARISON:  No pertinent prior exams are available for comparison. FINDINGS: Brain: Moderate generalized parenchymal atrophy. Advanced ill-defined hypoattenuation within the cerebral white matter is nonspecific, but consistent with chronic small vessel ischemic disease. There are multiple lacunar infarcts within the bilateral basal ganglia, some of which are clearly chronic and some of which are small and age-indeterminate. Small chronic appearing lacunar infarct within the right thalamus. Small chronic appearing infarct within the right cerebellum. Small chronic cortically based infarct within the paramedian right parietal lobe. No demarcated cortical infarct. No extra-axial fluid collection. No evidence of intracranial mass. No midline shift. Vascular: No hyperdense vessel.  Atherosclerotic calcifications Skull: Normal. Negative for fracture or focal lesion. Sinuses/Orbits: Visualized orbits show no acute finding. Trace ethmoid sinus mucosal thickening. No significant mastoid effusion IMPRESSION: No evidence of acute intracranial hemorrhage or acute demarcated cortical infarct. There are multiple lacunar infarcts within the bilateral basal ganglia, some of which are clearly chronic and some of which are small and age-indeterminate. Small chronic appearing infarcts are also present within the right  thalamus and right cerebellar hemisphere. Background moderate generalized parenchymal atrophy and advanced cerebral white matter chronic small vessel ischemic disease. Small chronic cortically based infarct within the paramedian right parietal lobe. Minimal ethmoid sinus mucosal thickening. Electronically Signed   By: Kellie Simmering DO   On: 05/14/2020 17:32   MR ANGIO HEAD WO CONTRAST  Result Date: 05/15/2020 CLINICAL DATA:  Neuro deficit, acute, stroke suspected. EXAM: MRA HEAD WITHOUT CONTRAST TECHNIQUE: Angiographic images of the Circle of Willis were obtained using MRA technique without intravenous contrast. COMPARISON:  Brain MRI 05/14/2020, head CT 05/14/2020. FINDINGS: The intracranial internal carotid arteries are patent. The M1 middle cerebral arteries are patent without significant stenosis. No M2 proximal branch occlusion or high-grade proximal stenosis is identified. The anterior cerebral arteries are patent. The intracranial vertebral arteries are patent. The basilar artery is patent. The posterior cerebral arteries are patent. Moderate stenoses within the left posterior cerebral artery at the P1/P2 junction and P3/P4 junction. A left posterior communicating artery  is present. The right posterior communicating artery is hypoplastic or absent. 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 IMPRESSION: No intracranial large vessel occlusion. Moderate stenoses are present within the left posterior cerebral artery at the P1/P2 junction and P3/P4 junction. 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 Electronically Signed   By: Kellie Simmering DO   On: 05/15/2020 09:25   MR Brain Wo Contrast (neuro protocol)  Result Date: 05/14/2020 CLINICAL DATA:  Weakness and decreased mobility EXAM: MRI HEAD WITHOUT CONTRAST TECHNIQUE: Multiplanar, multiecho pulse  sequences of the brain and surrounding structures were obtained without intravenous contrast. COMPARISON:  None. FINDINGS: The examination was discontinued prior to completion due to patient altered mental status and inability to cooperate with the technologist's instructions. Axial diffusion-weighted imaging only was obtained. There are multiple small areas of hyperintensity on diffusion-weighted imaging, including the right frontal white matter, right periatrial white matter and left temporal lobe. No midline shift or other mass effect. IMPRESSION: 1. Truncated examination due to patient altered mental status and inability to cooperate with the technologist's instructions. 2. Multiple small areas of abnormal diffusivity within the right frontal white matter, right periatrial white matter and left temporal lobe. These are most likely small infarcts, possibly from an embolic source. Electronically Signed   By: Ulyses Jarred M.D.   On: 05/14/2020 21:46   DG Chest Port 1 View  Result Date: 05/14/2020 CLINICAL DATA:  Weakness.  Confusion.  Cough. EXAM: PORTABLE CHEST 1 VIEW COMPARISON:  None. FINDINGS: Numerous leads and wires project over the chest. Midline trachea. Borderline cardiomegaly. Aortic atherosclerosis. Left costophrenic angle minimally excluded. No pleural effusion or pneumothorax. Clear lungs. IMPRESSION: No acute cardiopulmonary disease. Electronically Signed   By: Abigail Miyamoto M.D.   On: 05/14/2020 17:24   DG Swallowing Func-Speech Pathology  Result Date: 05/16/2020 Objective Swallowing Evaluation: Type of Study: MBS-Modified Barium Swallow Study  Patient Details Name: Melinda Olson MRN: [id number] Date of Birth: Jan 15, 1946 Today's Date: 05/16/2020 Time: SLP Start Time (ACUTE ONLY): 5361 -SLP Stop Time (ACUTE ONLY): 0952 SLP Time Calculation (min) (ACUTE ONLY): 18 min Past Medical History: No past medical history on file. Past Surgical History: No past surgical history on file. HPI: 74 y.o. female  with medical history significant of no significant past medical history who was brought in by EMS due to generalized weakness over the last 3 days. Per chart patient apparently has been declining in the last few months. MRI multiple small areas of abnormal diffusivity within the right frontal white matter, right periatrial white matter and left temporal lobe.  Subjective: alert, cooperative, slow processing Assessment / Plan / Recommendation CHL IP CLINICAL IMPRESSIONS 05/16/2020 Clinical Impression Pt has a moderate oropharyngeal dysphagia with oral intake prolonged regardless of consistency. She has prolonged lingual rocking to try to propel boluses posteriorly, with reduced control and posterior loss into the pharynx as she tries to clear her oral cavity. There is diffuse oral residue that she eventually clears with cues. Premature spillage of thin liquids enters the laryngeal vestibule before the swallow with intermittent aspiration (cough only x1) and penetration to the vocal folds that cannot be cleared for the remainder of the study as her cued cough is weak. She has an anterior curvature of her spine that narrows her pharyngeal space and her pharyngeal squeeze and base of tongue retraction are weak, so there  is residue in that increases as consistencies become thicker. Despite an increase in residue she has improved airway protection with nectar thick liquids and purees. There is some trace but transient penetration with both textures. Pt also has an outpouching in her cervical esophagus in which barium collects (appearance of a diverticulum) and at times this results in mild backflow into the pyriform sinuses. Pt had a difficult time attempting strategies throughout the study, such as chin tuck and effort swallow, making it less likely to be utilized across meals. Recommend that pt remain on Dys 1 diet with nectar thick liquids with additional SLP f/u to trial therapeutic exercise.  SLP Visit Diagnosis  Dysphagia, oropharyngeal phase (R13.12) Attention and concentration deficit following -- Frontal lobe and executive function deficit following -- Impact on safety and function Moderate aspiration risk   CHL IP TREATMENT RECOMMENDATION 05/16/2020 Treatment Recommendations Therapy as outlined in treatment plan below   Prognosis 05/16/2020 Prognosis for Safe Diet Advancement Good Barriers to Reach Goals Cognitive deficits;Severity of deficits Barriers/Prognosis Comment -- CHL IP DIET RECOMMENDATION 05/16/2020 SLP Diet Recommendations Dysphagia 1 (Puree) solids;Nectar thick liquid Liquid Administration via Cup Medication Administration Crushed with puree Compensations Slow rate;Minimize environmental distractions;Small sips/bites;Clear throat intermittently Postural Changes Seated upright at 90 degrees;Remain semi-upright after after feeds/meals (Comment)   CHL IP OTHER RECOMMENDATIONS 05/16/2020 Recommended Consults -- Oral Care Recommendations Oral care BID Other Recommendations Order thickener from pharmacy;Prohibited food (jello, ice cream, thin soups);Remove water pitcher   CHL IP FOLLOW UP RECOMMENDATIONS 05/16/2020 Follow up Recommendations Skilled Nursing facility   [facility] IP FREQUENCY AND DURATION 05/16/2020 Speech Therapy Frequency (ACUTE ONLY) min 2x/week Treatment Duration 2 weeks      CHL IP ORAL PHASE 05/16/2020 Oral Phase Impaired Oral - Pudding Teaspoon -- Oral - Pudding Cup -- Oral - Honey Teaspoon -- Oral - Honey Cup -- Oral - Nectar Teaspoon -- Oral - Nectar Cup Weak lingual manipulation;Lingual pumping;Reduced posterior propulsion;Lingual/palatal residue;Delayed oral transit;Decreased bolus cohesion;Premature spillage Oral - Nectar Straw -- Oral - Thin Teaspoon -- Oral - Thin Cup Weak lingual manipulation;Lingual pumping;Reduced posterior propulsion;Lingual/palatal residue;Delayed oral transit;Decreased bolus cohesion;Premature spillage Oral - Thin Straw -- Oral - Puree Weak lingual manipulation;Lingual  pumping;Reduced posterior propulsion;Lingual/palatal residue;Delayed oral transit;Decreased bolus cohesion;Premature spillage Oral - Mech Soft -- Oral - Regular -- Oral - Multi-Consistency -- Oral - Pill -- Oral Phase - Comment --  CHL IP PHARYNGEAL PHASE 05/16/2020 Pharyngeal Phase Impaired Pharyngeal- Pudding Teaspoon -- Pharyngeal -- Pharyngeal- Pudding Cup -- Pharyngeal -- Pharyngeal- Honey Teaspoon -- Pharyngeal -- Pharyngeal- Honey Cup -- Pharyngeal -- Pharyngeal- Nectar Teaspoon -- Pharyngeal -- Pharyngeal- Nectar Cup Reduced pharyngeal peristalsis;Reduced anterior laryngeal mobility;Reduced tongue base retraction;Pharyngeal residue - valleculae;Pharyngeal residue - pyriform;Penetration/Aspiration before swallow Pharyngeal Material enters airway, remains ABOVE vocal cords then ejected out Pharyngeal- Nectar Straw -- Pharyngeal -- Pharyngeal- Thin Teaspoon -- Pharyngeal -- Pharyngeal- Thin Cup Reduced pharyngeal peristalsis;Reduced anterior laryngeal mobility;Reduced tongue base retraction;Pharyngeal residue - valleculae;Pharyngeal residue - pyriform;Penetration/Aspiration before swallow Pharyngeal Material enters airway, passes BELOW cords without attempt by patient to eject out (silent aspiration);Material enters airway, passes BELOW cords and not ejected out despite cough attempt by patient Pharyngeal- Thin Straw -- Pharyngeal -- Pharyngeal- Puree Reduced pharyngeal peristalsis;Reduced anterior laryngeal mobility;Reduced tongue base retraction;Pharyngeal residue - valleculae;Pharyngeal residue - pyriform;Penetration/Aspiration before swallow Pharyngeal Material enters airway, remains ABOVE vocal cords then ejected out Pharyngeal- Mechanical Soft -- Pharyngeal -- Pharyngeal- Regular -- Pharyngeal -- Pharyngeal- Multi-consistency -- Pharyngeal -- Pharyngeal- Pill -- Pharyngeal -- Pharyngeal Comment --  CHL IP CERVICAL ESOPHAGEAL PHASE 05/16/2020 Cervical Esophageal Phase Impaired Pudding Teaspoon -- Pudding Cup  -- Honey Teaspoon -- Honey Cup -- Nectar Teaspoon -- Nectar Cup Esophageal backflow into the pharynx Nectar Straw -- Thin Teaspoon -- Thin Cup Esophageal backflow into the pharynx Thin Straw -- Puree Esophageal backflow into the pharynx Mechanical Soft -- Regular -- Multi-consistency -- Pill -- Cervical Esophageal Comment -- Osie Bond., M.A. [facility] Pager [phone] Office [phone] 05/16/2020, 11:02 AM              ECHOCARDIOGRAM COMPLETE  Result Date: 05/15/2020    ECHOCARDIOGRAM REPORT   Patient Name:   ZOUA CAPORASO Date of Exam: 05/15/2020 Medical Rec #:  [id number]     Height:       65.0 in Accession #:    [id number]    Weight:       175.0 lb Date of Birth:  06-14-1946     BSA:          1.869 m Patient Age:    61 years      BP:           119/79 mmHg Patient Gender: F             HR:           75 bpm. Exam Location:  Inpatient Procedure: 2D Echo Indications:    stroke 434.91  History:        Patient has no prior history of Echocardiogram examinations.                 Risk Factors:Current Smoker.  Sonographer:    Jannett Celestine RDCS (AE) Referring Phys: [id number] [place]  Sonographer Comments: restricted mobility IMPRESSIONS  1. Left ventricular ejection fraction, by estimation, is 65 to 70%. The left ventricle has normal function. The left ventricle has no regional wall motion abnormalities. There is severe concentric left ventricular hypertrophy. Left ventricular diastolic  parameters are consistent with Grade I diastolic dysfunction (impaired relaxation).  2. Right ventricular systolic function is normal. The right ventricular size is normal. Tricuspid regurgitation signal is inadequate for assessing PA pressure.  3. The mitral valve is normal in structure. No evidence of mitral valve regurgitation.  4. The aortic valve is normal in structure. Aortic valve regurgitation is mild to moderate. FINDINGS  Left Ventricle: Left ventricular ejection fraction, by estimation, is  65 to 70%. The left ventricle has normal function. The left ventricle has no regional wall motion abnormalities. The left ventricular internal cavity size was normal in size. There is  severe concentric left ventricular hypertrophy. Left ventricular diastolic parameters are consistent with Grade I diastolic dysfunction (impaired relaxation). Indeterminate filling pressures. Right Ventricle: The right ventricular size is normal. No increase in right ventricular wall thickness. Right ventricular systolic function is normal. Tricuspid regurgitation signal is inadequate for assessing PA pressure. Left Atrium: Left atrial size was normal in size. Right Atrium: Right atrial size was normal in size. Pericardium: There is no evidence of pericardial effusion. Mitral Valve: The mitral valve is normal in structure. No evidence of mitral valve regurgitation. Tricuspid Valve: The tricuspid valve is normal in structure. Tricuspid valve regurgitation is trivial. Aortic Valve: The aortic valve is normal in structure. Aortic valve regurgitation is mild to moderate. Aortic regurgitation PHT measures 411 msec. Pulmonic Valve: The pulmonic valve was grossly normal. Pulmonic valve regurgitation is not visualized. Aorta: The aortic root and ascending aorta are structurally normal, with no evidence of dilitation. Venous:  The inferior vena cava was not well visualized. IAS/Shunts: No atrial level shunt detected by color flow Doppler.  LEFT VENTRICLE PLAX 2D LVIDd:         3.10 cm  Diastology LVIDs:         2.25 cm  LV e' lateral:   3.15 cm/s LV PW:         1.70 cm  LV E/e' lateral: 13.2 LV IVS:        1.70 cm  LV e' medial:    3.15 cm/s LVOT diam:     2.00 cm  LV E/e' medial:  13.2 LV SV:         57 LV SV Index:   31 LVOT Area:     3.14 cm  LEFT ATRIUM             Index       RIGHT ATRIUM           Index LA diam:        2.90 cm 1.55 cm/m  RA Area:     13.70 cm LA Vol (A2C):   25.8 ml 13.80 ml/m RA Volume:   30.20 ml  16.16 ml/m LA Vol  (A4C):   18.4 ml 9.84 ml/m LA Biplane Vol: 21.8 ml 11.66 ml/m  AORTIC VALVE LVOT Vmax:   98.60 cm/s LVOT Vmean:  76.100 cm/s LVOT VTI:    0.183 m AI PHT:      411 msec  AORTA Ao Root diam: 3.30 cm MITRAL VALVE MV Area (PHT): 3.19 cm    SHUNTS MV Decel Time: 238 msec    Systemic VTI:  0.18 m MV E velocity: 41.60 cm/s  Systemic Diam: 2.00 cm MV A velocity: 84.40 cm/s MV E/A ratio:  0.49 Mihai Croitoru MD Electronically signed by Sanda Klein MD Signature Date/Time: 05/15/2020/6:14:42 PM    Final    VAS US CAROTID  Result Date: 05/15/2020 Carotid Arterial Duplex Study Indications: Weakness and confusion. Performing Technologist: Griffin Basil RCT RDMS  Examination Guidelines: A complete evaluation includes B-mode imaging, spectral Doppler, color Doppler, and power Doppler as needed of all accessible portions of each vessel. Bilateral testing is considered an integral part of a complete examination. Limited examinations for reoccurring indications may be performed as noted.  Right Carotid Findings: +----------+--------+--------+--------+------------------+------------------+           PSV cm/sEDV cm/sStenosisPlaque DescriptionComments           +----------+--------+--------+--------+------------------+------------------+ CCA Prox  159     4                                                    +----------+--------+--------+--------+------------------+------------------+ CCA Distal81      10                                intimal thickening +----------+--------+--------+--------+------------------+------------------+ ICA Prox  112     23      1-39%                     intimal thickening +----------+--------+--------+--------+------------------+------------------+ ICA Distal104     22                                                   +----------+--------+--------+--------+------------------+------------------+  ECA       158     3                                                     +----------+--------+--------+--------+------------------+------------------+ +----------+--------+-------+--------+-------------------+           PSV cm/sEDV cmsDescribeArm Pressure (mmHG) +----------+--------+-------+--------+-------------------+ [id number]     2                                  +----------+--------+-------+--------+-------------------+ +---------+--------+---+--------+--+---------+ VertebralPSV cm/s118EDV cm/s13Antegrade +---------+--------+---+--------+--+---------+  Left Carotid Findings: +----------+--------+--------+--------+--------------------+-------------------+           PSV cm/sEDV cm/sStenosisPlaque Description  Comments            +----------+--------+--------+--------+--------------------+-------------------+ CCA Prox  85      11                                                      +----------+--------+--------+--------+--------------------+-------------------+ CCA Distal104     13                                  intimal thickening  +----------+--------+--------+--------+--------------------+-------------------+ ICA Prox  319     36      60-79%  focal, hyperechoic  High resistant flow                                   and homogeneous                         +----------+--------+--------+--------+--------------------+-------------------+ ICA Distal126     12                                  intimal thickening  +----------+--------+--------+--------+--------------------+-------------------+ ECA       278     5       >50%    focal and                                                                 hyperechoic                             +----------+--------+--------+--------+--------------------+-------------------+ +----------+--------+--------+--------+-------------------+           PSV cm/sEDV cm/sDescribeArm Pressure (mmHG) +----------+--------+--------+--------+-------------------+  Subclavian132     2                                   +----------+--------+--------+--------+-------------------+ +---------+--------+---+--------+-+----------+ VertebralPSV cm/s107EDV cm/s3Retrograde +---------+--------+---+--------+-+----------+   Summary: Right Carotid: Velocities in the right ICA are consistent with a 1-39% stenosis. Left Carotid: Velocities in the  left ICA are consistent with a 60-79% stenosis.               The ECA appears >50% stenosed. Vertebrals: Right vertebral artery demonstrates antegrade flow. Left vertebral             artery demonstrates retrograde flow. *See table(s) above for measurements and observations.  Electronically signed by Antony Contras MD on 05/15/2020 at 1:10:29 PM.    Final         Scheduled Meds: . aspirin EC  81 mg Oral Daily  . atorvastatin  40 mg Oral Daily  . cloNIDine  0.1 mg Oral TID  . cyanocobalamin  1,000 mcg Intramuscular Daily   Followed by  . [START ON 05/23/2020] vitamin B-12  1,000 mcg Oral Daily  . enoxaparin (LOVENOX) injection  40 mg Subcutaneous Q24H  . folic acid  1 mg Oral Daily  . lactulose  20 g Oral BID  . multivitamin with minerals  1 tablet Oral Daily  . [START ON 05/24/2020] thiamine injection  100 mg Intravenous Daily   Continuous Infusions: . cefTRIAXone (ROCEPHIN)  IV 1 g (05/15/20 1946)  . dextrose 5 % and 0.45% NaCl 100 mL/hr at 05/15/20 0648  . thiamine injection 500 mg (05/16/20 0704)   Followed by  . [START ON 05/18/2020] thiamine injection       LOS: 2 days   Time spent= 25 mins    Darthy Manganelli Arsenio Loader, MD [facility]  If 7PM-7AM, please contact night-coverage  05/16/2020, 11:23 AM

## 2020-05-16 NOTE — Progress Notes (Signed)
Brief Neuro Update:  Low B12, Folate levels. I ordered IM Cobalamin replacement x 1 week, then PO along with PO Folic acid replacement. Her thiamine levels are pending but I will go ahead with high dose Thiamine replacement protocol IV. She should be discharged on PO S91 and Folic acid.   [place] Pager Number [phone]

## 2020-05-17 LAB — BASIC METABOLIC PANEL
Anion gap: 9 (ref 5–15)
BUN: 14 mg/dL (ref 8–23)
CO2: 19 mmol/L — ABNORMAL LOW (ref 22–32)
Calcium: 8.2 mg/dL — ABNORMAL LOW (ref 8.9–10.3)
Chloride: 114 mmol/L — ABNORMAL HIGH (ref 98–111)
Creatinine, Ser: 1.3 mg/dL — ABNORMAL HIGH (ref 0.44–1.00)
GFR calc Af Amer: 46 mL/min — ABNORMAL LOW (ref >60)
GFR calc non Af Amer: 40 mL/min — ABNORMAL LOW (ref >60)
Glucose, Bld: 159 mg/dL — ABNORMAL HIGH (ref 70–99)
Potassium: 3.6 mmol/L (ref 3.5–5.1)
Sodium: 142 mmol/L (ref 135–145)

## 2020-05-17 LAB — CBC
HCT: 37.7 % (ref 36.0–46.0)
Hemoglobin: 12 g/dL (ref 12.0–15.0)
MCH: 31.8 pg (ref 26.0–34.0)
MCHC: 31.8 g/dL (ref 30.0–36.0)
MCV: 100 fL (ref 80.0–100.0)
Platelets: 230 10*3/uL (ref 150–400)
RBC: 3.77 MIL/uL — ABNORMAL LOW (ref 3.87–5.11)
RDW: 13.2 % (ref 11.5–15.5)
WBC: 8.1 10*3/uL (ref 4.0–10.5)
nRBC: 0 % (ref 0.0–0.2)

## 2020-05-17 LAB — GLUCOSE, CAPILLARY
Glucose-Capillary: 132 mg/dL — ABNORMAL HIGH (ref 70–99)
Glucose-Capillary: 149 mg/dL — ABNORMAL HIGH (ref 70–99)
Glucose-Capillary: 120 mg/dL — ABNORMAL HIGH (ref 70–99)

## 2020-05-17 LAB — SARS CORONAVIRUS 2 BY RT PCR (HOSPITAL ORDER, PERFORMED IN ~~LOC~~ HOSPITAL LAB): SARS Coronavirus 2: NEGATIVE

## 2020-05-17 LAB — METHYLMALONIC ACID, SERUM: Methylmalonic Acid, Quantitative: 195 nmol/L (ref 0–378)

## 2020-05-17 LAB — VITAMIN B1: Vitamin B1 (Thiamine): 192 nmol/L (ref 66.5–200.0)

## 2020-05-17 LAB — MAGNESIUM: Magnesium: 2.1 mg/dL (ref 1.7–2.4)

## 2020-05-17 MED ORDER — CEPHALEXIN 500 MG PO CAPS
500.0000 mg | ORAL_CAPSULE | Freq: Two times a day (BID) | ORAL | Status: DC
  Administered 2020-05-17 – 2020-05-18 (×3): 500 mg via ORAL
  Filled 2020-05-17 (×3): qty 1

## 2020-05-17 MED ORDER — NICOTINE 14 MG/24HR TD PT24: 14.0000 mg | MEDICATED_PATCH | Freq: Every day | TRANSDERMAL | Status: DC | PRN

## 2020-05-17 MED ORDER — CLOPIDOGREL BISULFATE 75 MG PO TABS
75.0000 mg | ORAL_TABLET | Freq: Every day | ORAL | Status: DC
  Administered 2020-05-17 – 2020-05-18 (×2): 75 mg via ORAL
  Filled 2020-05-17: qty 1

## 2020-05-17 NOTE — Progress Notes (Addendum)
STROKE TEAM PROGRESS NOTE   INTERVAL HISTORY No family at bedside. Repeat MRI obtained for better imaging. Did show acute infarcts right corona radiata. MRA showed left P1 stenosis.  Patient appears more interactive today and less confused  Vitals:   05/16/20 2327 05/17/20 0335 05/17/20 0515 05/17/20 0758  BP: (!) 169/75 (!) 197/78 (!) 153/75 (!) 160/94  Pulse: 72 64 68 61  Resp: 16 16  15   Temp: 97.9 F (36.6 C) 97.9 F (36.6 C)  97.6 F (36.4 C)  TempSrc: Oral Oral  Oral  SpO2: 98% 99%  97%  Weight:      Height:       CBC:  Recent Labs  Lab 05/14/20 1605 05/14/20 1605 05/15/20 0415 05/15/20 1106 05/16/20 0351  WBC 12.1*   < > 13.5*  --  10.2  NEUTROABS 10.5*  --   --   --   --   HGB 13.5   < > 14.6  --  12.1  HCT 41.2   < > 45.2 25.8* 37.3  MCV 96.7   < > 98.5  --  98.4  PLT 269   < > 260  --  225   < > = values in this interval not displayed.   Basic Metabolic Panel:  Recent Labs  Lab 05/15/20 0415 05/16/20 0351  NA 138 142  K 5.6* 3.5  CL 106 111  CO2 18* 21*  GLUCOSE 165* 129*  BUN 20 17  CREATININE 1.25* 1.12*  CALCIUM 8.5* 8.4*  MG  --  2.0   Lipid Panel:  Recent Labs  Lab 05/16/20 1921  CHOL 155  TRIG 132  HDL 30*  CHOLHDL 5.2  VLDL 26  LDLCALC 99    HgbA1c:  Recent Labs  Lab 05/15/20 0747  HGBA1C 6.6*   IMAGING past 24 hours MR ANGIO HEAD WO CONTRAST  Result Date: 05/16/2020 CLINICAL DATA:  Stroke follow-up EXAM: MRI HEAD WITHOUT CONTRAST MRA HEAD WITHOUT CONTRAST TECHNIQUE: Multiplanar, multiecho pulse sequences of the brain and surrounding structures were obtained without intravenous contrast. Angiographic images of the head were obtained using MRA technique without contrast. COMPARISON:  05/15/2020 FINDINGS: MRI HEAD FINDINGS Brain: Restricted diffusion in the right frontal white matter (corona radiata). Additional area of fainter restricted diffusion in the right periatrial white matter and the left periatrial white matter (serise 4,  images 28 and 25). Mild associated edema without substantial mass effect. There are numerous small remote lacunar infarcts in bilateral basal ganglia and bilateral thalami. There are additional small remote white matter infarcts bilaterally and small bilateral cerebellar lacunar infarcts. There is moderate to advanced suprimposed T2/FLAIR hyperintensity in the white matter, compatible with chronic microvascular ischemic disease. Moderate diffuse cerebral atrophy with ex vacuo ventricular dilation. No hydrocephalus. There are numerous punctate areas of susceptibility artifact, predominantly involving supratentorial cortex, compatible with prior microhemorrhages. More focal prior linear susceptibility artifact involving the left external capsule, compatible with an additional site of prior hemorrhage. Skull and upper cervical spine: No focal marrow replacing lesion. Sinuses/Orbits: Negative. Other: No mastoid effusion. MRA HEAD FINDINGS No evidence of significant (greater than 50%) stenosis or occlusion involving the anterior circulation. 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 Moderate stenosis of the left P1 posterior cerebral artery. A left posterior communicating artery is present. The right posterior communicator artery is hypoplastic or absent. IMPRESSION: 1. Acute infarct in the right corona radiata.  Additional smaller acute or subacute infarcts in the right periatrial and left parietal white matter. Involvement of multiple vascular territories suggests a central embolic origin. 2. Advanced chronic microvascular ischemic disease with numerous infratentorial and supratentorial remote lacunar infarcts. 3. Numerous prior cortical microhemorrhages, which may relate to the patient's atrial fibrillation (favored) or amyloid angiopathy. Additional remote left basal ganglia hemorrhage. 4. Moderate stenosis of the  left P1 PCA. Otherwise, no significant (greater than 50%) arterial stenosis or occlusion identified. 5. 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 Critical Value/emergent results were called by telephone at the time of interpretation on 05/16/2020 at 3:30 pm to provider Dr. Reesa Chew, who verbally acknowledged these results. Electronically Signed   By: Margaretha Sheffield MD   On: 05/16/2020 15:34   MR BRAIN WO CONTRAST  Result Date: 05/16/2020 CLINICAL DATA:  Stroke follow-up EXAM: MRI HEAD WITHOUT CONTRAST MRA HEAD WITHOUT CONTRAST TECHNIQUE: Multiplanar, multiecho pulse sequences of the brain and surrounding structures were obtained without intravenous contrast. Angiographic images of the head were obtained using MRA technique without contrast. COMPARISON:  05/15/2020 FINDINGS: MRI HEAD FINDINGS Brain: Restricted diffusion in the right frontal white matter (corona radiata). Additional area of fainter restricted diffusion in the right periatrial white matter and the left periatrial white matter (serise 4, images 28 and 25). Mild associated edema without substantial mass effect. There are numerous small remote lacunar infarcts in bilateral basal ganglia and bilateral thalami. There are additional small remote white matter infarcts bilaterally and small bilateral cerebellar lacunar infarcts. There is moderate to advanced suprimposed T2/FLAIR hyperintensity in the white matter, compatible with chronic microvascular ischemic disease. Moderate diffuse cerebral atrophy with ex vacuo ventricular dilation. No hydrocephalus. There are numerous punctate areas of susceptibility artifact, predominantly involving supratentorial cortex, compatible with prior microhemorrhages. More focal prior linear susceptibility artifact involving the left external capsule, compatible with an additional site of prior hemorrhage. Skull and upper cervical spine: No focal marrow replacing  lesion. Sinuses/Orbits: Negative. Other: No mastoid effusion. MRA HEAD FINDINGS No evidence of significant (greater than 50%) stenosis or occlusion involving the anterior circulation. 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 Moderate stenosis of the left P1 posterior cerebral artery. A left posterior communicating artery is present. The right posterior communicator artery is hypoplastic or absent. IMPRESSION: 1. Acute infarct in the right corona radiata. Additional smaller acute or subacute infarcts in the right periatrial and left parietal white matter. Involvement of multiple vascular territories suggests a central embolic origin. 2. Advanced chronic microvascular ischemic disease with numerous infratentorial and supratentorial remote lacunar infarcts. 3. Numerous prior cortical microhemorrhages, which may relate to the patient's atrial fibrillation (favored) or amyloid angiopathy. Additional remote left basal ganglia hemorrhage. 4. Moderate stenosis of the left P1 PCA. Otherwise, no significant (greater than 50%) arterial stenosis or occlusion identified. 5. 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 Critical Value/emergent results were called by telephone at the time of interpretation on 05/16/2020 at 3:30 pm to provider Dr. Reesa Chew, who verbally acknowledged these results. Electronically Signed   By: Margaretha Sheffield MD   On: 05/16/2020 15:34   DG Swallowing Func-Speech Pathology  Result Date: 05/16/2020 Objective Swallowing Evaluation: Type of Study: MBS-Modified Barium Swallow Study  Patient Details Name: Vesna Kable MRN: [id number] Date of Birth: 03/17/1946 Today's Date: 05/16/2020 Time: SLP  Start Time (ACUTE ONLY): [id number] -SLP Stop Time (ACUTE ONLY): 7026 SLP Time Calculation (min) (ACUTE ONLY): 18 min Past Medical History: No past medical  history on file. Past Surgical History: No past surgical history on file. HPI: 74 y.o. female with medical history significant of no significant past medical history who was brought in by EMS due to generalized weakness over the last 3 days. Per chart patient apparently has been declining in the last few months. MRI multiple small areas of abnormal diffusivity within the right frontal white matter, right periatrial white matter and left temporal lobe.  Subjective: alert, cooperative, slow processing Assessment / Plan / Recommendation CHL IP CLINICAL IMPRESSIONS 05/16/2020 Clinical Impression Pt has a moderate oropharyngeal dysphagia with oral intake prolonged regardless of consistency. She has prolonged lingual rocking to try to propel boluses posteriorly, with reduced control and posterior loss into the pharynx as she tries to clear her oral cavity. There is diffuse oral residue that she eventually clears with cues. Premature spillage of thin liquids enters the laryngeal vestibule before the swallow with intermittent aspiration (cough only x1) and penetration to the vocal folds that cannot be cleared for the remainder of the study as her cued cough is weak. She has an anterior curvature of her spine that narrows her pharyngeal space and her pharyngeal squeeze and base of tongue retraction are weak, so there is residue in that increases as consistencies become thicker. Despite an increase in residue she has improved airway protection with nectar thick liquids and purees. There is some trace but transient penetration with both textures. Pt also has an outpouching in her cervical esophagus in which barium collects (appearance of a diverticulum) and at times this results in mild backflow into the pyriform sinuses. Pt had a difficult time attempting strategies throughout the study, such as chin tuck and effort swallow, making it less likely to be utilized across meals. Recommend that pt remain on Dys 1 diet with nectar  thick liquids with additional SLP f/u to trial therapeutic exercise.  SLP Visit Diagnosis Dysphagia, oropharyngeal phase (R13.12) Attention and concentration deficit following -- Frontal lobe and executive function deficit following -- Impact on safety and function Moderate aspiration risk   CHL IP TREATMENT RECOMMENDATION 05/16/2020 Treatment Recommendations Therapy as outlined in treatment plan below   Prognosis 05/16/2020 Prognosis for Safe Diet Advancement Good Barriers to Reach Goals Cognitive deficits;Severity of deficits Barriers/Prognosis Comment -- CHL IP DIET RECOMMENDATION 05/16/2020 SLP Diet Recommendations Dysphagia 1 (Puree) solids;Nectar thick liquid Liquid Administration via Cup Medication Administration Crushed with puree Compensations Slow rate;Minimize environmental distractions;Small sips/bites;Clear throat intermittently Postural Changes Seated upright at 90 degrees;Remain semi-upright after after feeds/meals (Comment)   CHL IP OTHER RECOMMENDATIONS 05/16/2020 Recommended Consults -- Oral Care Recommendations Oral care BID Other Recommendations Order thickener from pharmacy;Prohibited food (jello, ice cream, thin soups);Remove water pitcher   CHL IP FOLLOW UP RECOMMENDATIONS 05/16/2020 Follow up Recommendations Skilled Nursing facility   [facility] IP FREQUENCY AND DURATION 05/16/2020 Speech Therapy Frequency (ACUTE ONLY) min 2x/week Treatment Duration 2 weeks      CHL IP ORAL PHASE 05/16/2020 Oral Phase Impaired Oral - Pudding Teaspoon -- Oral - Pudding Cup -- Oral - Honey Teaspoon -- Oral - Honey Cup -- Oral - Nectar Teaspoon -- Oral - Nectar Cup Weak lingual manipulation;Lingual pumping;Reduced posterior propulsion;Lingual/palatal residue;Delayed oral transit;Decreased bolus cohesion;Premature spillage Oral - Nectar Straw -- Oral - Thin Teaspoon -- Oral - Thin Cup Weak lingual manipulation;Lingual pumping;Reduced posterior propulsion;Lingual/palatal residue;Delayed oral transit;Decreased bolus cohesion;Premature  spillage Oral - Thin Straw -- Oral - Puree Weak lingual manipulation;Lingual pumping;Reduced posterior propulsion;Lingual/palatal residue;Delayed oral transit;Decreased bolus cohesion;Premature spillage Oral - Mech Soft -- Oral - Regular -- Oral - Multi-Consistency -- Oral - Pill -- Oral Phase - Comment --  CHL IP PHARYNGEAL PHASE 05/16/2020 Pharyngeal Phase Impaired Pharyngeal- Pudding Teaspoon -- Pharyngeal -- Pharyngeal- Pudding Cup -- Pharyngeal -- Pharyngeal- Honey Teaspoon -- Pharyngeal -- Pharyngeal- Honey Cup -- Pharyngeal -- Pharyngeal- Nectar Teaspoon -- Pharyngeal -- Pharyngeal- Nectar Cup Reduced pharyngeal peristalsis;Reduced anterior laryngeal mobility;Reduced tongue base retraction;Pharyngeal residue - valleculae;Pharyngeal residue - pyriform;Penetration/Aspiration before swallow Pharyngeal Material enters airway, remains ABOVE vocal cords then ejected out Pharyngeal- Nectar Straw -- Pharyngeal -- Pharyngeal- Thin Teaspoon -- Pharyngeal -- Pharyngeal- Thin Cup Reduced pharyngeal peristalsis;Reduced anterior laryngeal mobility;Reduced tongue base retraction;Pharyngeal residue - valleculae;Pharyngeal residue - pyriform;Penetration/Aspiration before swallow Pharyngeal Material enters airway, passes BELOW cords without attempt by patient to eject out (silent aspiration);Material enters airway, passes BELOW cords and not ejected out despite cough attempt by patient Pharyngeal- Thin Straw -- Pharyngeal -- Pharyngeal- Puree Reduced pharyngeal peristalsis;Reduced anterior laryngeal mobility;Reduced tongue base retraction;Pharyngeal residue - valleculae;Pharyngeal residue - pyriform;Penetration/Aspiration before swallow Pharyngeal Material enters airway, remains ABOVE vocal cords then ejected out Pharyngeal- Mechanical Soft -- Pharyngeal -- Pharyngeal- Regular -- Pharyngeal -- Pharyngeal- Multi-consistency -- Pharyngeal -- Pharyngeal- Pill -- Pharyngeal -- Pharyngeal Comment --  CHL IP CERVICAL ESOPHAGEAL PHASE  05/16/2020 Cervical Esophageal Phase Impaired Pudding Teaspoon -- Pudding Cup -- Honey Teaspoon -- Honey Cup -- Nectar Teaspoon -- Nectar Cup Esophageal backflow into the pharynx Nectar Straw -- Thin Teaspoon -- Thin Cup Esophageal backflow into the pharynx Thin Straw -- Puree Esophageal backflow into the pharynx Mechanical Soft -- Regular -- Multi-consistency -- Pill -- Cervical Esophageal Comment -- Osie Bond., M.A. [facility] Pager [phone] Office [phone] 05/16/2020, 11:02 AM               PHYSICAL EXAM Frail malnourished looking cachectic elderly Caucasian lady not in distress. . Afebrile. Head is nontraumatic. Neck is supple without bruit.    Cardiac exam no murmur or gallop. Lungs are clear to auscultation. Distal pulses are well felt.  Neurological Exam ;  Awake  Alert disoriented x 3.  Slow hesitant nonfluent speech.  Follows simple midline and one-step commands.  Diminished attention, registration and recall.  Eye movements full without nystagmus.fundi were not visualized. Vision acuity and fields appear normal. Hearing is normal. Palatal movements are normal. Face asymmetric.  With left lower facial weakness.  Tongue midline. Mild left hemiparesis 4/5 with weakness of left grip intrinsic hand muscles and orbits right over left upper extremity.  Mild left hip flexor and ankle dorsiflexor weakness.  Normal sensation. Gait deferred.  ASSESSMENT/PLAN Ms. Arya Luttrull is a 74 y.o. female with no significant past medical history.  Patient was brought to the ED by EMS secondary to noting generalized weakness and reduced mobility over the last 7 months along and decline with mentation.  Patient has not seen a provider in over 25 years.  On arrival to the ED patient was able to speak to the son and husband, they stated that she has been declining for several months, now however for the past 3 days she was unable to walk to which she usually uses a walker at home.  For that  reason patient was admitted for weakness and failure to thrive. mRs: 4 Right subcortical stroke likely from small vessel disease in the setting of encephalopathy from vitamin  B12 deficiency and UTI  CTH: no acute abnormality  MRI  1.  ( motion degraded)Truncated examination due to patient altered mental status and inability to cooperate with the technologist's instructions 2. Multiple small areas of abnormal diffusivity within the rightfrontal white matter, right periatrial white matter and lefttemporal lobe. These are most likely small infarcts, possibly from an embolic source.  Repeat MRI/ MRA brain 05/16/20: 1. Acute infarct in the right corona radiata. Additional smaller acute or subacute infarcts in the right periatrial and left parietal white matter. Involvement of multiple vascular territories suggests a central embolic origin. 2. Advanced chronic microvascular ischemic disease with numerous infratentorial and supratentorial remote lacunar infarcts. 3. Numerous prior cortical microhemorrhages, which may relate to the patient's atrial fibrillation (favored) or amyloid angiopathy. Additional remote left basal ganglia hemorrhage. 4. Moderate stenosis of the left P1 PCA. Otherwise, no significant (greater than 50%) arterial stenosis or occlusion identified. 5. 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  MRA no LVO; repeat d/t degraded images   Carotid Doppler  Right Carotid: Velocities in the right ICA are consistent with a 1-39%  stenosis.   Left Carotid: Velocities in the left ICA are consistent with a 60-79%  stenosis.                The ECA appears >50% stenosed.   Vertebrals: Right vertebral artery demonstrates antegrade flow. Left  vertebral              artery demonstrates retrograde flow.  2D Echo   1. Left ventricular ejection fraction, by estimation, is 65 to 70%. The  left ventricle has normal function. The  left ventricle has no regional  wall motion abnormalities. There is severe concentric left ventricular  hypertrophy. Left ventricular diastolic   parameters are consistent with Grade I diastolic dysfunction (impaired  relaxation).   2. Right ventricular systolic function is normal. The right ventricular  size is normal. Tricuspid regurgitation signal is inadequate for assessing  PA pressure.   3. The mitral valve is normal in structure. No evidence of mitral valve  regurgitation.   4. The aortic valve is normal in structure. Aortic valve regurgitation is  mild to moderate.   LDL direct: 122.8  HgbA1c 6.6  VTE prophylaxis - lovenox    Diet   DIET - DYS 1 Room service appropriate? No; Fluid consistency: Nectar Thick    No antithrombotic prior to admission, now on aspirin 81 mg daily.   Therapy recommendations:  OT: SNF; SLP: nectar thick liquids  Disposition:  pending  Hypertension  Home meds:  none  Stable . Permissive hypertension (OK if < 220/120) but gradually normalize in 5-7 days . Long-term BP goal normotensive  Hyperlipidemia  Home meds:  none  LDL , goal < 70  Add lipitor 40 mg daily   Continue statin at discharge  Diabetes type II Controlled  Home meds:  none  HgbA1c 6.6, goal < 7.0  CBGs Recent Labs    05/16/20 1555 05/16/20 2240 05/17/20 0627  GLUCAP 122* 95 132*      Other Stroke Risk Factors  Cigarette smoker advised to stop smoking  Obesity, Body mass index is 29.12 kg/m., recommend weight loss, diet and exercise as appropriate   Other Active Problems  Replace B12  Treat UTI per primary team  Hospital day # 3  Laurey Morale, MSN, NP-C [facility] [phone] Recommend aspirin Plavix for 3 weeks followed by aspirin  alone.  Aggressive risk factor modification.  Treatment for UTI and low B12 as per primary team.  Discussed with Dr. Reesa Chew. stroke team will sign off.  Kindly call for questions. Antony Contras,  MD To contact Stroke Continuity provider, please refer to [email or web address] After hours, contact General Neurology

## 2020-05-17 NOTE — TOC Progression Note (Signed)
Transition of Care [facility]) - Progression Note    Patient Details  Name: Melinda Olson MRN: [id number] Date of Birth: 1945/11/09  Transition of Care [facility]) CM/SW [place], [place] Phone Number: 05/17/2020, 4:14 PM  Clinical Narrative:   CSW spoke with patient's son, Melinda Olson, about bed offers and son chose [facility] or [facility]. [facility] has no beds available, but [facility] can take the patient after an updated COVID test. Order placed for Rapid test, but it will not result in time for patient to admit tonight. Patient can transition to [facility] tomorrow. CSW updated patient, husband at bedside, and son via phone. Son indicated that patient is now agreeable to receiving the COVID vaccine, but it is too late to make that happen in the hospital. CSW discussed with [facility] and they can coordinate the patient being vaccinated while she is over there.     Expected Discharge Plan: [place] Barriers to Discharge: Continued Medical Work up  Expected Discharge Plan and Services Expected Discharge Plan: [place] Choice: [place] arrangements for the past 2 months: Single Family Home                                       Social Determinants of Health (SDOH) Interventions    Readmission Risk Interventions No flowsheet data found.

## 2020-05-17 NOTE — Progress Notes (Signed)
PROGRESS NOTE    Melinda Olson  KDX:[id number] DOB: 1946-05-09 DOA: 05/14/2020 PCP: Patient, No Pcp Per   Brief Narrative:  74 year old with no significant past medical history admitted for generalized weakness and reduced mobility for the past several months and decline in mentation.  She was admitted to the hospital for further care and management.   Assessment & Plan:   Principal Problem:   AMS (altered mental status) Active Problems:   UTI (urinary tract infection)   Leucocytosis   AKI (acute kidney injury) ([place])   Acute CVA (cerebrovascular accident) ([place])  Acute encephalopathy, multifactorial Acute multiple small infarcts -MRI brain-truncated study due to altered mental status, multiple areas of abnormal diffusivity concerns for small infarcts -MRA-no acute vessel occlusion, moderate stenosis of the posterior circulation, 3 mm saccular aneurysm -A1c 6.6, LDL 122 -Aspirin and Plavix ordered, Lipitor 40 mg daily. -Neurology following -Accu-Cheks, TSH normal  Urinary tract infection without hematuria; E Coli.  -Urine cultures growing E. coli, pansensitive.  Will be transitioned to oral Keflex.  Total 5-day course of antibiotics-last day 05/19/20  Vitamin B12 and folate deficiency -Supplements  Elevated ammonia -On lactulose, titrate to 2-3 bowel movements daily  DVT prophylaxis: Lovenox Code Status: Full  Family Communication: Rick updated.   Status is: Inpatient  Remains inpatient appropriate because:Inpatient level of care appropriate due to severity of illness  Dispo: The patient is from: Home              Anticipated d/c is to: Home              Anticipated d/c date is: 1-2 days              Patient currently is not medically stable to d/c.  Still slightly confused, slow to improve.  Awaiting culture data.  Hopefully discharge in next 1-2 days  Body mass index is 29.12 kg/m.  Subjective: Feels ok, no complaints.   Review of Systems Otherwise negative  except as per HPI, including: General = no fevers, chills, dizziness,  fatigue HEENT/EYES = negative for loss of vision, double vision, blurred vision,  sore throa Cardiovascular= negative for chest pain, palpitation Respiratory/lungs= negative for shortness of breath, cough, wheezing; hemoptysis,  Gastrointestinal= negative for nausea, vomiting, abdominal pain Genitourinary= negative for Dysuria MSK = Negative for arthralgia, myalgias Neurology= Negative for headache, numbness, tingling  Psychiatry= Negative for suicidal and homocidal ideation Skin= Negative for Rash  Examination: Constitutional: Chronically Ill and frail.  Respiratory: Clear to auscultation bilaterally Cardiovascular: Normal sinus rhythm, no rubs Abdomen: Nontender nondistended good bowel sounds Musculoskeletal: No edema noted Skin: No rashes seen; left sided neck mass.  Neurologic: CN 2-12 grossly intact.  And nonfocal Psychiatric: Alert to name and place. Poor judgement.   Objective: Vitals:   05/16/20 2327 05/17/20 0335 05/17/20 0515 05/17/20 0758  BP: (!) 169/75 (!) 197/78 (!) 153/75 (!) 160/94  Pulse: 72 64 68 61  Resp: 16 16  15   Temp: 97.9 F (36.6 C) 97.9 F (36.6 C)  97.6 F (36.4 C)  TempSrc: Oral Oral  Oral  SpO2: 98% 99%  97%  Weight:      Height:        Intake/Output Summary (Last 24 hours) at 05/17/2020 1137 Last data filed at 05/17/2020 0800 Gross per 24 hour  Intake 1120 ml  Output 600 ml  Net 520 ml   Filed Weights   05/14/20 2117  Weight: 79.4 kg     Data Reviewed:   CBC: Recent Labs  Lab 05/14/20 1605 05/15/20 0415 05/15/20 1106 05/16/20 0351  WBC 12.1* 13.5*  --  10.2  NEUTROABS 10.5*  --   --   --   HGB 13.5 14.6  --  12.1  HCT 41.2 45.2 25.8* 37.3  MCV 96.7 98.5  --  98.4  PLT 269 260  --  119   Basic Metabolic Panel: Recent Labs  Lab 05/14/20 1605 05/15/20 0415 05/16/20 0351  NA 143 138 142  K 3.9 5.6* 3.5  CL 110 106 111  CO2 24 18* 21*  GLUCOSE 137*  165* 129*  BUN 17 20 17   CREATININE 1.17* 1.25* 1.12*  CALCIUM 8.5* 8.5* 8.4*  MG  --   --  2.0   GFR: Estimated Creatinine Clearance: 45.2 mL/min (A) (by C-G formula based on SCr of 1.12 mg/dL (H)). Liver Function Tests: Recent Labs  Lab 05/14/20 1605 05/15/20 0415  AST 25 34  ALT 13 10  ALKPHOS 114 100  BILITOT 0.6 1.0  PROT 7.0 6.7  ALBUMIN 3.7 3.5   No results for input(s): LIPASE, AMYLASE in the last 168 hours. Recent Labs  Lab 05/15/20 0747  AMMONIA 41*   Coagulation Profile: No results for input(s): INR, PROTIME in the last 168 hours. Cardiac Enzymes: No results for input(s): CKTOTAL, CKMB, CKMBINDEX, TROPONINI in the last 168 hours. BNP (last 3 results) No results for input(s): PROBNP in the last 8760 hours. HbA1C: Recent Labs    05/15/20 0747  HGBA1C 6.6*   CBG: Recent Labs  Lab 05/16/20 0747 05/16/20 1146 05/16/20 1555 05/16/20 2240 05/17/20 0627  GLUCAP 110* 137* 122* 95 132*   Lipid Profile: Recent Labs    05/15/20 0747 05/16/20 1921  CHOL  --  155  HDL  --  30*  LDLCALC  --  99  TRIG  --  132  CHOLHDL  --  5.2  LDLDIRECT 122.8*  --    Thyroid Function Tests: Recent Labs    05/15/20 0747  TSH 3.535   Anemia Panel: Recent Labs    05/15/20 0747 05/15/20 1106  VITAMINB12 131* 133*  FOLATE 3.8*  --    Sepsis Labs: No results for input(s): PROCALCITON, LATICACIDVEN in the last 168 hours.  Recent Results (from the past 240 hour(s))  Urine culture     Status: Abnormal   Collection Time: 05/14/20  6:00 PM   Specimen: Urine, Catheterized  Result Value Ref Range Status   Specimen Description   Final    URINE, CATHETERIZED Performed at [facility] [address] [place], [place] [address]    Special Requests   Final    NONE Performed at [facility], [place] [address] [place], [place] [address]    Culture >=100,000 COLONIES/mL ESCHERICHIA COLI (A)  Final   Report Status 05/16/2020 FINAL   Final   Organism ID, Bacteria ESCHERICHIA COLI (A)  Final      Susceptibility   Escherichia coli - MIC*    AMPICILLIN <=2 SENSITIVE Sensitive     CEFAZOLIN <=4 SENSITIVE Sensitive     CEFTRIAXONE <=0.25 SENSITIVE Sensitive     CIPROFLOXACIN <=0.25 SENSITIVE Sensitive     GENTAMICIN <=1 SENSITIVE Sensitive     IMIPENEM <=0.25 SENSITIVE Sensitive     NITROFURANTOIN <=16 SENSITIVE Sensitive     TRIMETH/SULFA <=20 SENSITIVE Sensitive     AMPICILLIN/SULBACTAM <=2 SENSITIVE Sensitive     PIP/TAZO <=4 SENSITIVE Sensitive     * >=100,000 COLONIES/mL ESCHERICHIA COLI  SARS Coronavirus  2 by RT PCR (hospital order, performed in [facility] hospital lab) Nasopharyngeal Nasopharyngeal Swab     Status: None   Collection Time: 05/14/20  8:52 PM   Specimen: Nasopharyngeal Swab  Result Value Ref Range Status   SARS Coronavirus 2 NEGATIVE NEGATIVE Final    Comment: (NOTE) SARS-CoV-2 target nucleic acids are NOT DETECTED.  The SARS-CoV-2 RNA is generally detectable in upper and lower respiratory specimens during the acute phase of infection. The lowest concentration of SARS-CoV-2 viral copies this assay can detect is 250 copies / mL. A negative result does not preclude SARS-CoV-2 infection and should not be used as the sole basis for treatment or other patient management decisions.  A negative result may occur with improper specimen collection / handling, submission of specimen other than nasopharyngeal swab, presence of viral mutation(s) within the areas targeted by this assay, and inadequate number of viral copies (<250 copies / mL). A negative result must be combined with clinical observations, patient history, and epidemiological information.  Fact Sheet for Patients:   StrictlyIdeas.no  Fact Sheet for Healthcare Providers: BankingDealers.co.za  This test is not yet approved or  cleared by the [place] FDA and has been authorized for  detection and/or diagnosis of SARS-CoV-2 by FDA under an Emergency Use Authorization (EUA).  This EUA will remain in effect (meaning this test can be used) for the duration of the COVID-19 declaration under Section 564(b)(1) of the Act, 21 U.S.C. section 360bbb-3(b)(1), unless the authorization is terminated or revoked sooner.  Performed at [facility], [place] [address] [place], [place] [address]          Radiology Studies: MR ANGIO HEAD WO CONTRAST  Result Date: 05/16/2020 CLINICAL DATA:  Stroke follow-up EXAM: MRI HEAD WITHOUT CONTRAST MRA HEAD WITHOUT CONTRAST TECHNIQUE: Multiplanar, multiecho pulse sequences of the brain and surrounding structures were obtained without intravenous contrast. Angiographic images of the head were obtained using MRA technique without contrast. COMPARISON:  05/15/2020 FINDINGS: MRI HEAD FINDINGS Brain: Restricted diffusion in the right frontal white matter (corona radiata). Additional area of fainter restricted diffusion in the right periatrial white matter and the left periatrial white matter (serise 4, images 28 and 25). Mild associated edema without substantial mass effect. There are numerous small remote lacunar infarcts in bilateral basal ganglia and bilateral thalami. There are additional small remote white matter infarcts bilaterally and small bilateral cerebellar lacunar infarcts. There is moderate to advanced suprimposed T2/FLAIR hyperintensity in the white matter, compatible with chronic microvascular ischemic disease. Moderate diffuse cerebral atrophy with ex vacuo ventricular dilation. No hydrocephalus. There are numerous punctate areas of susceptibility artifact, predominantly involving supratentorial cortex, compatible with prior microhemorrhages. More focal prior linear susceptibility artifact involving the left external capsule, compatible with an additional site of prior hemorrhage. Skull and upper cervical spine: No focal marrow  replacing lesion. Sinuses/Orbits: Negative. Other: No mastoid effusion. MRA HEAD FINDINGS No evidence of significant (greater than 50%) stenosis or occlusion involving the anterior circulation. 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 Moderate stenosis of the left P1 posterior cerebral artery. A left posterior communicating artery is present. The right posterior communicator artery is hypoplastic or absent. IMPRESSION: 1. Acute infarct in the right corona radiata. Additional smaller acute or subacute infarcts in the right periatrial and left parietal white matter. Involvement of multiple vascular territories suggests a central embolic origin. 2. Advanced chronic microvascular ischemic  disease with numerous infratentorial and supratentorial remote lacunar infarcts. 3. Numerous prior cortical microhemorrhages, which may relate to the patient's atrial fibrillation (favored) or amyloid angiopathy. Additional remote left basal ganglia hemorrhage. 4. Moderate stenosis of the left P1 PCA. Otherwise, no significant (greater than 50%) arterial stenosis or occlusion identified. 5. 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 Critical Value/emergent results were called by telephone at the time of interpretation on 05/16/2020 at 3:30 pm to provider Dr. Reesa Chew, who verbally acknowledged these results. Electronically Signed   By: Margaretha Sheffield MD   On: 05/16/2020 15:34   MR BRAIN WO CONTRAST  Result Date: 05/16/2020 CLINICAL DATA:  Stroke follow-up EXAM: MRI HEAD WITHOUT CONTRAST MRA HEAD WITHOUT CONTRAST TECHNIQUE: Multiplanar, multiecho pulse sequences of the brain and surrounding structures were obtained without intravenous contrast. Angiographic images of the head were obtained using MRA technique without contrast. COMPARISON:  05/15/2020 FINDINGS: MRI HEAD  FINDINGS Brain: Restricted diffusion in the right frontal white matter (corona radiata). Additional area of fainter restricted diffusion in the right periatrial white matter and the left periatrial white matter (serise 4, images 28 and 25). Mild associated edema without substantial mass effect. There are numerous small remote lacunar infarcts in bilateral basal ganglia and bilateral thalami. There are additional small remote white matter infarcts bilaterally and small bilateral cerebellar lacunar infarcts. There is moderate to advanced suprimposed T2/FLAIR hyperintensity in the white matter, compatible with chronic microvascular ischemic disease. Moderate diffuse cerebral atrophy with ex vacuo ventricular dilation. No hydrocephalus. There are numerous punctate areas of susceptibility artifact, predominantly involving supratentorial cortex, compatible with prior microhemorrhages. More focal prior linear susceptibility artifact involving the left external capsule, compatible with an additional site of prior hemorrhage. Skull and upper cervical spine: No focal marrow replacing lesion. Sinuses/Orbits: Negative. Other: No mastoid effusion. MRA HEAD FINDINGS No evidence of significant (greater than 50%) stenosis or occlusion involving the anterior circulation. 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 Moderate stenosis of the left P1 posterior cerebral artery. A left posterior communicating artery is present. The right posterior communicator artery is hypoplastic or absent. IMPRESSION: 1. Acute infarct in the right corona radiata. Additional smaller acute or subacute infarcts in the right periatrial and left parietal white matter. Involvement of multiple vascular territories suggests a central embolic origin. 2. Advanced chronic microvascular ischemic disease with numerous infratentorial and supratentorial remote  lacunar infarcts. 3. Numerous prior cortical microhemorrhages, which may relate to the patient's atrial fibrillation (favored) or amyloid angiopathy. Additional remote left basal ganglia hemorrhage. 4. Moderate stenosis of the left P1 PCA. Otherwise, no significant (greater than 50%) arterial stenosis or occlusion identified. 5. 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 Critical Value/emergent results were called by telephone at the time of interpretation on 05/16/2020 at 3:30 pm to provider Dr. Reesa Chew, who verbally acknowledged these results. Electronically Signed   By: Margaretha Sheffield MD   On: 05/16/2020 15:34   DG Swallowing Func-Speech Pathology  Result Date: 05/16/2020 Objective Swallowing Evaluation: Type of Study: MBS-Modified Barium Swallow Study  Patient Details Name: Melinda Olson MRN: [id number] Date of Birth: August 26, 1946 Today's Date: 05/16/2020 Time: SLP Start Time (ACUTE ONLY): 0934 -SLP Stop Time (ACUTE ONLY): 0952 SLP Time Calculation (min) (ACUTE ONLY): 18 min Past Medical History: No past medical history on file. Past Surgical  History: No past surgical history on file. HPI: 74 y.o. female with medical history significant of no significant past medical history who was brought in by EMS due to generalized weakness over the last 3 days. Per chart patient apparently has been declining in the last few months. MRI multiple small areas of abnormal diffusivity within the right frontal white matter, right periatrial white matter and left temporal lobe.  Subjective: alert, cooperative, slow processing Assessment / Plan / Recommendation CHL IP CLINICAL IMPRESSIONS 05/16/2020 Clinical Impression Pt has a moderate oropharyngeal dysphagia with oral intake prolonged regardless of consistency. She has prolonged lingual rocking to try to propel boluses posteriorly, with reduced control and posterior loss into the pharynx as she tries to clear her oral  cavity. There is diffuse oral residue that she eventually clears with cues. Premature spillage of thin liquids enters the laryngeal vestibule before the swallow with intermittent aspiration (cough only x1) and penetration to the vocal folds that cannot be cleared for the remainder of the study as her cued cough is weak. She has an anterior curvature of her spine that narrows her pharyngeal space and her pharyngeal squeeze and base of tongue retraction are weak, so there is residue in that increases as consistencies become thicker. Despite an increase in residue she has improved airway protection with nectar thick liquids and purees. There is some trace but transient penetration with both textures. Pt also has an outpouching in her cervical esophagus in which barium collects (appearance of a diverticulum) and at times this results in mild backflow into the pyriform sinuses. Pt had a difficult time attempting strategies throughout the study, such as chin tuck and effort swallow, making it less likely to be utilized across meals. Recommend that pt remain on Dys 1 diet with nectar thick liquids with additional SLP f/u to trial therapeutic exercise.  SLP Visit Diagnosis Dysphagia, oropharyngeal phase (R13.12) Attention and concentration deficit following -- Frontal lobe and executive function deficit following -- Impact on safety and function Moderate aspiration risk   CHL IP TREATMENT RECOMMENDATION 05/16/2020 Treatment Recommendations Therapy as outlined in treatment plan below   Prognosis 05/16/2020 Prognosis for Safe Diet Advancement Good Barriers to Reach Goals Cognitive deficits;Severity of deficits Barriers/Prognosis Comment -- CHL IP DIET RECOMMENDATION 05/16/2020 SLP Diet Recommendations Dysphagia 1 (Puree) solids;Nectar thick liquid Liquid Administration via Cup Medication Administration Crushed with puree Compensations Slow rate;Minimize environmental distractions;Small sips/bites;Clear throat intermittently  Postural Changes Seated upright at 90 degrees;Remain semi-upright after after feeds/meals (Comment)   CHL IP OTHER RECOMMENDATIONS 05/16/2020 Recommended Consults -- Oral Care Recommendations Oral care BID Other Recommendations Order thickener from pharmacy;Prohibited food (jello, ice cream, thin soups);Remove water pitcher   CHL IP FOLLOW UP RECOMMENDATIONS 05/16/2020 Follow up Recommendations Skilled Nursing facility   [facility] IP FREQUENCY AND DURATION 05/16/2020 Speech Therapy Frequency (ACUTE ONLY) min 2x/week Treatment Duration 2 weeks      CHL IP ORAL PHASE 05/16/2020 Oral Phase Impaired Oral - Pudding Teaspoon -- Oral - Pudding Cup -- Oral - Honey Teaspoon -- Oral - Honey Cup -- Oral - Nectar Teaspoon -- Oral - Nectar Cup Weak lingual manipulation;Lingual pumping;Reduced posterior propulsion;Lingual/palatal residue;Delayed oral transit;Decreased bolus cohesion;Premature spillage Oral - Nectar Straw -- Oral - Thin Teaspoon -- Oral - Thin Cup Weak lingual manipulation;Lingual pumping;Reduced posterior propulsion;Lingual/palatal residue;Delayed oral transit;Decreased bolus cohesion;Premature spillage Oral - Thin Straw -- Oral - Puree Weak lingual manipulation;Lingual pumping;Reduced posterior propulsion;Lingual/palatal residue;Delayed oral transit;Decreased bolus cohesion;Premature spillage Oral - Mech Soft -- Oral - Regular -- Oral -  Multi-Consistency -- Oral - Pill -- Oral Phase - Comment --  CHL IP PHARYNGEAL PHASE 05/16/2020 Pharyngeal Phase Impaired Pharyngeal- Pudding Teaspoon -- Pharyngeal -- Pharyngeal- Pudding Cup -- Pharyngeal -- Pharyngeal- Honey Teaspoon -- Pharyngeal -- Pharyngeal- Honey Cup -- Pharyngeal -- Pharyngeal- Nectar Teaspoon -- Pharyngeal -- Pharyngeal- Nectar Cup Reduced pharyngeal peristalsis;Reduced anterior laryngeal mobility;Reduced tongue base retraction;Pharyngeal residue - valleculae;Pharyngeal residue - pyriform;Penetration/Aspiration before swallow Pharyngeal Material enters airway, remains  ABOVE vocal cords then ejected out Pharyngeal- Nectar Straw -- Pharyngeal -- Pharyngeal- Thin Teaspoon -- Pharyngeal -- Pharyngeal- Thin Cup Reduced pharyngeal peristalsis;Reduced anterior laryngeal mobility;Reduced tongue base retraction;Pharyngeal residue - valleculae;Pharyngeal residue - pyriform;Penetration/Aspiration before swallow Pharyngeal Material enters airway, passes BELOW cords without attempt by patient to eject out (silent aspiration);Material enters airway, passes BELOW cords and not ejected out despite cough attempt by patient Pharyngeal- Thin Straw -- Pharyngeal -- Pharyngeal- Puree Reduced pharyngeal peristalsis;Reduced anterior laryngeal mobility;Reduced tongue base retraction;Pharyngeal residue - valleculae;Pharyngeal residue - pyriform;Penetration/Aspiration before swallow Pharyngeal Material enters airway, remains ABOVE vocal cords then ejected out Pharyngeal- Mechanical Soft -- Pharyngeal -- Pharyngeal- Regular -- Pharyngeal -- Pharyngeal- Multi-consistency -- Pharyngeal -- Pharyngeal- Pill -- Pharyngeal -- Pharyngeal Comment --  CHL IP CERVICAL ESOPHAGEAL PHASE 05/16/2020 Cervical Esophageal Phase Impaired Pudding Teaspoon -- Pudding Cup -- Honey Teaspoon -- Honey Cup -- Nectar Teaspoon -- Nectar Cup Esophageal backflow into the pharynx Nectar Straw -- Thin Teaspoon -- Thin Cup Esophageal backflow into the pharynx Thin Straw -- Puree Esophageal backflow into the pharynx Mechanical Soft -- Regular -- Multi-consistency -- Pill -- Cervical Esophageal Comment -- Osie Bond., M.A. [facility] Pager [phone] Office [phone] 05/16/2020, 11:02 AM              ECHOCARDIOGRAM COMPLETE  Result Date: 05/15/2020    ECHOCARDIOGRAM REPORT   Patient Name:   Melinda Olson Date of Exam: 05/15/2020 Medical Rec #:  [id number]     Height:       65.0 in Accession #:    [id number]    Weight:       175.0 lb Date of Birth:  01-29-1946     BSA:          1.869 m Patient Age:    55 years       BP:           119/79 mmHg Patient Gender: F             HR:           75 bpm. Exam Location:  Inpatient Procedure: 2D Echo Indications:    stroke 434.91  History:        Patient has no prior history of Echocardiogram examinations.                 Risk Factors:Current Smoker.  Sonographer:    Jannett Celestine RDCS (AE) Referring Phys: [id number] [place]  Sonographer Comments: restricted mobility IMPRESSIONS  1. Left ventricular ejection fraction, by estimation, is 65 to 70%. The left ventricle has normal function. The left ventricle has no regional wall motion abnormalities. There is severe concentric left ventricular hypertrophy. Left ventricular diastolic  parameters are consistent with Grade I diastolic dysfunction (impaired relaxation).  2. Right ventricular systolic function is normal. The right ventricular size is normal. Tricuspid regurgitation signal is inadequate for assessing PA pressure.  3. The mitral valve is normal in structure. No evidence of mitral valve regurgitation.  4. The aortic valve is normal in structure. Aortic valve regurgitation  is mild to moderate. FINDINGS  Left Ventricle: Left ventricular ejection fraction, by estimation, is 65 to 70%. The left ventricle has normal function. The left ventricle has no regional wall motion abnormalities. The left ventricular internal cavity size was normal in size. There is  severe concentric left ventricular hypertrophy. Left ventricular diastolic parameters are consistent with Grade I diastolic dysfunction (impaired relaxation). Indeterminate filling pressures. Right Ventricle: The right ventricular size is normal. No increase in right ventricular wall thickness. Right ventricular systolic function is normal. Tricuspid regurgitation signal is inadequate for assessing PA pressure. Left Atrium: Left atrial size was normal in size. Right Atrium: Right atrial size was normal in size. Pericardium: There is no evidence of pericardial effusion. Mitral  Valve: The mitral valve is normal in structure. No evidence of mitral valve regurgitation. Tricuspid Valve: The tricuspid valve is normal in structure. Tricuspid valve regurgitation is trivial. Aortic Valve: The aortic valve is normal in structure. Aortic valve regurgitation is mild to moderate. Aortic regurgitation PHT measures 411 msec. Pulmonic Valve: The pulmonic valve was grossly normal. Pulmonic valve regurgitation is not visualized. Aorta: The aortic root and ascending aorta are structurally normal, with no evidence of dilitation. Venous: The inferior vena cava was not well visualized. IAS/Shunts: No atrial level shunt detected by color flow Doppler.  LEFT VENTRICLE PLAX 2D LVIDd:         3.10 cm  Diastology LVIDs:         2.25 cm  LV e' lateral:   3.15 cm/s LV PW:         1.70 cm  LV E/e' lateral: 13.2 LV IVS:        1.70 cm  LV e' medial:    3.15 cm/s LVOT diam:     2.00 cm  LV E/e' medial:  13.2 LV SV:         57 LV SV Index:   31 LVOT Area:     3.14 cm  LEFT ATRIUM             Index       RIGHT ATRIUM           Index LA diam:        2.90 cm 1.55 cm/m  RA Area:     13.70 cm LA Vol (A2C):   25.8 ml 13.80 ml/m RA Volume:   30.20 ml  16.16 ml/m LA Vol (A4C):   18.4 ml 9.84 ml/m LA Biplane Vol: 21.8 ml 11.66 ml/m  AORTIC VALVE LVOT Vmax:   98.60 cm/s LVOT Vmean:  76.100 cm/s LVOT VTI:    0.183 m AI PHT:      411 msec  AORTA Ao Root diam: 3.30 cm MITRAL VALVE MV Area (PHT): 3.19 cm    SHUNTS MV Decel Time: 238 msec    Systemic VTI:  0.18 m MV E velocity: 41.60 cm/s  Systemic Diam: 2.00 cm MV A velocity: 84.40 cm/s MV E/A ratio:  0.49 Mihai Croitoru MD Electronically signed by Sanda Klein MD Signature Date/Time: 05/15/2020/6:14:42 PM    Final         Scheduled Meds: . aspirin EC  81 mg Oral Daily  . atorvastatin  40 mg Oral Daily  . cephALEXin  500 mg Oral Q12H  . cloNIDine  0.1 mg Oral TID  . clopidogrel  75 mg Oral Daily  . cyanocobalamin  1,000 mcg Intramuscular Daily   Followed by  .  [START ON 05/23/2020] vitamin B-12  1,000 mcg Oral Daily  .  enoxaparin (LOVENOX) injection  40 mg Subcutaneous Q24H  . folic acid  1 mg Oral Daily  . lactulose  20 g Oral BID  . multivitamin with minerals  1 tablet Oral Daily  . [START ON 05/24/2020] thiamine injection  100 mg Intravenous Daily   Continuous Infusions: . dextrose 5 % and 0.45% NaCl 100 mL/hr at 05/17/20 1012  . thiamine injection 500 mg (05/17/20 0501)   Followed by  . [START ON 05/18/2020] thiamine injection       LOS: 3 days   Time spent= 25 mins  Tamieka Rancourt Arsenio Loader, MD [facility]  If 7PM-7AM, please contact night-coverage  05/17/2020, 11:37 AM

## 2020-05-17 NOTE — Progress Notes (Signed)
  Speech Language Pathology Treatment: Dysphagia;Cognitive-Linquistic  Patient Details Name: Melinda Olson MRN: [id number] DOB: 07/13/1946 Today's Date: 05/17/2020 Time: 0086-7619 SLP Time Calculation (min) (ACUTE ONLY): 16 min  Assessment / Plan / Recommendation Clinical Impression  Pt seen for swallow/cognitive treatment.  Limited recall of results/recs from yesterday's MBS.  Reviewed necessity of nectar liquids.  Pt demonstrated effortful swallow and f/u sub-swallow 10/10 opportunities. Verbal cues required for f/u throat-clearing/dry swallow to reduce residue.  Able to identify one consequence of stroke ("I can't walk.") and after discussion recalled two others; unable to problem-solve how impairments might impact function.  Affect flat, speech aprosodic, but speech intelligibility has improved.  SLP will continue to follow during acute admission.    HPI HPI: 74 y.o. female with no significant past medical history was brought in by EMS due to generalized weakness over the last 3 days. Per chart patient apparently has been declining in the last few months. MRI multiple small areas of abnormal diffusivity within the right frontal white matter, right periatrial white matter and left temporal lobe.      SLP Plan  Continue with current plan of care       Recommendations  Diet recommendations: Dysphagia 1 (puree);Nectar-thick liquid Liquids provided via: Cup Medication Administration: Crushed with puree Supervision: Full supervision/cueing for compensatory strategies Compensations: Slow rate;Minimize environmental distractions;Small sips/bites;Clear throat intermittently                Oral Care Recommendations: Oral care BID Follow up Recommendations: Skilled Nursing facility SLP Visit Diagnosis: Dysphagia, oropharyngeal phase (R13.12) Plan: Continue with current plan of care       GO                Juan Quam Laurice 05/17/2020, 11:10 AM  Estill Bamberg L. Tivis Ringer, [place] Office number [phone] Pager [phone]

## 2020-05-18 LAB — CBC
HCT: 38.1 % (ref 36.0–46.0)
Hemoglobin: 11.9 g/dL — ABNORMAL LOW (ref 12.0–15.0)
MCH: 30.7 pg (ref 26.0–34.0)
MCHC: 31.2 g/dL (ref 30.0–36.0)
MCV: 98.4 fL (ref 80.0–100.0)
Platelets: 211 10*3/uL (ref 150–400)
RBC: 3.87 MIL/uL (ref 3.87–5.11)
RDW: 13 % (ref 11.5–15.5)
WBC: 8.5 10*3/uL (ref 4.0–10.5)
nRBC: 0 % (ref 0.0–0.2)

## 2020-05-18 LAB — BASIC METABOLIC PANEL
Anion gap: 9 (ref 5–15)
BUN: 8 mg/dL (ref 8–23)
CO2: 19 mmol/L — ABNORMAL LOW (ref 22–32)
Calcium: 8.1 mg/dL — ABNORMAL LOW (ref 8.9–10.3)
Chloride: 115 mmol/L — ABNORMAL HIGH (ref 98–111)
Creatinine, Ser: 1.1 mg/dL — ABNORMAL HIGH (ref 0.44–1.00)
GFR calc Af Amer: 57 mL/min — ABNORMAL LOW (ref >60)
GFR calc non Af Amer: 49 mL/min — ABNORMAL LOW (ref >60)
Glucose, Bld: 149 mg/dL — ABNORMAL HIGH (ref 70–99)
Potassium: 3.5 mmol/L (ref 3.5–5.1)
Sodium: 143 mmol/L (ref 135–145)

## 2020-05-18 LAB — MAGNESIUM: Magnesium: 1.7 mg/dL (ref 1.7–2.4)

## 2020-05-18 MED ORDER — CYANOCOBALAMIN 1000 MCG PO TABS: 1000.0000 ug | ORAL_TABLET | Freq: Every day | ORAL | Status: AC

## 2020-05-18 MED ORDER — THIAMINE HCL 100 MG PO TABS: 100.0000 mg | ORAL_TABLET | Freq: Every day | ORAL | 0 refills | Status: AC

## 2020-05-18 MED ORDER — ATORVASTATIN CALCIUM 40 MG PO TABS: 40.0000 mg | ORAL_TABLET | Freq: Every day | ORAL | Status: AC

## 2020-05-18 MED ORDER — CEPHALEXIN 500 MG PO CAPS: 500.0000 mg | ORAL_CAPSULE | Freq: Two times a day (BID) | ORAL | 0 refills | Status: AC

## 2020-05-18 MED ORDER — SENNOSIDES-DOCUSATE SODIUM 8.6-50 MG PO TABS: 2.0000 | ORAL_TABLET | Freq: Every evening | ORAL | Status: AC | PRN

## 2020-05-18 MED ORDER — ADULT MULTIVITAMIN W/MINERALS CH: 1.0000 | ORAL_TABLET | Freq: Every day | ORAL | Status: AC

## 2020-05-18 MED ORDER — CLOPIDOGREL BISULFATE 75 MG PO TABS: 75.0000 mg | ORAL_TABLET | Freq: Every day | ORAL | 0 refills | Status: AC

## 2020-05-18 MED ORDER — AMLODIPINE BESYLATE 5 MG PO TABS: 5.0000 mg | ORAL_TABLET | Freq: Every day | ORAL | 11 refills | Status: DC

## 2020-05-18 MED ORDER — ASPIRIN 81 MG PO TBEC: 81.0000 mg | DELAYED_RELEASE_TABLET | Freq: Every day | ORAL | 11 refills | Status: AC

## 2020-05-18 MED ORDER — NICOTINE 14 MG/24HR TD PT24
14.0000 mg | MEDICATED_PATCH | Freq: Every day | TRANSDERMAL | 0 refills | Status: DC | PRN
Start: 2020-05-18 — End: 2020-09-23

## 2020-05-18 MED ORDER — FOLIC ACID 1 MG PO TABS: 1.0000 mg | ORAL_TABLET | Freq: Every day | ORAL | Status: AC

## 2020-05-18 NOTE — Progress Notes (Signed)
Report called to [facility] RN; transport her to take patient via ambulance.

## 2020-05-18 NOTE — Discharge Summary (Signed)
Physician Discharge Summary  Melinda Olson OZH:[id number] DOB: 19-May-1946 DOA: 05/14/2020  PCP: Patient, No Pcp Per  Admit date: 05/14/2020 Discharge date: 05/18/2020  Admitted From: Home Disposition: SNF  Recommendations for Outpatient Follow-up:  1. Follow up with PCP in 1-2 weeks 2. Please obtain BMP/CBC in one week your next doctors visit.  3. Started on Norvasc 5 mg orally daily 4. Aspirin and Plavix for 3 weeks followed by aspirin alone 5. Follow-up outpatient neurology in 3 weeks 6. Lipitor 40 mg daily 7. P.o. Keflex for 1 more day 8. Vitamins started-cyanocobalamin, folic acid, thiamine and multivitamin.  Please continue this   Discharge Condition: Stable CODE STATUS: Full code Diet recommendation: Heart healthy  Brief/Interim Summary: 75 year old with no significant past medical history admitted for generalized weakness and reduced mobility for the past several months and decline in mentation.  She was admitted to the hospital for further care and management.  During the hospitalization was diagnosed of acute CVA therefore started on aspirin Plavix for 3 weeks followed by aspirin alone.  Lipitor 40 mg daily.  Seen by neurology service.  Was also diagnosed with urinary tract infection with E. coli which was pansensitive, received IV Rocephin transition to oral Keflex for 1 more day at the time of discharge.  Add vitamin B12 folate and thiamine deficiency therefore supplements were started. PT recommended SNF therefore arrangements were made. She is medically stable to be discharged to rehabilitation.  Left patient's son a voicemail on his cell phone on 05/18/2020.  I requested him to call me back if he has any further questions.  Assessment & Plan:   Principal Problem:   AMS (altered mental status) Active Problems:   UTI (urinary tract infection)   Leucocytosis   AKI (acute kidney injury) ([place])   Acute CVA (cerebrovascular accident) ([place])  Acute encephalopathy,  multifactorial Acute multiple small infarcts -MRI brain-truncated study due to altered mental status, multiple areas of abnormal diffusivity concerns for small infarcts -MRA-no acute vessel occlusion, moderate stenosis of the posterior circulation, 3 mm saccular aneurysm -A1c 6.6, LDL 122 -Aspirin and Plavix for 3 weeks followed by aspirin alone, Lipitor 40 mg daily. -Seen by neurology team. -Accu-Cheks, TSH normal  Urinary tract infection without hematuria; E Coli.  -Urine cultures growing E. coli, pansensitive.  Will be transitioned to oral Keflex.  Total 5-day course of antibiotics-last day 05/19/20  Vitamin B12 and folate deficiency -Supplements-continue outpatient supplements  Elevated ammonia -Resolved, continue outpatient lactulose after discharge if necessary.  Body mass index is 29.12 kg/m.         Discharge Diagnoses:  Principal Problem:   AMS (altered mental status) Active Problems:   UTI (urinary tract infection)   Leucocytosis   AKI (acute kidney injury) ([place])   Acute CVA (cerebrovascular accident) [facility])      Consultations:  Neurology  Subjective: Patient seen and examined at bedside, no complaints.  Discharge Exam: Vitals:   05/18/20 0315 05/18/20 0848  BP: 137/86 139/78  Pulse: 68 77  Resp: 17 18  Temp: 97.9 F (36.6 C) 97.8 F (36.6 C)  SpO2: 99% 98%   Vitals:   05/17/20 2018 05/18/20 0007 05/18/20 0315 05/18/20 0848  BP: (!) 153/90 (!) 183/99 137/86 139/78  Pulse: 75 85 68 77  Resp: 16 18 17 18   Temp: 98.8 F (37.1 C) (!) 97.5 F (36.4 C) 97.9 F (36.6 C) 97.8 F (36.6 C)  TempSrc: Oral Oral  Oral  SpO2: 97% 99% 99% 98%  Weight:  Height:        General: Pt is alert, awake, not in acute distress Cardiovascular: RRR, S1/S2 +, no rubs, no gallops Respiratory: CTA bilaterally, no wheezing, no rhonchi Abdominal: Soft, NT, ND, bowel sounds + Extremities: no edema, no cyanosis She is alert awake oriented X to which is more  or less at her baseline.  Does have left neck mass and its soft.  Discharge Instructions   Allergies as of 05/18/2020   No Known Allergies     Medication List    TAKE these medications   amLODipine 5 MG tablet Commonly known as: NORVASC Take 1 tablet (5 mg total) by mouth daily.   aspirin 81 MG EC tablet Take 1 tablet (81 mg total) by mouth daily. Swallow whole.   atorvastatin 40 MG tablet Commonly known as: LIPITOR Take 1 tablet (40 mg total) by mouth daily.   cephALEXin 500 MG capsule Commonly known as: KEFLEX Take 1 capsule (500 mg total) by mouth every 12 (twelve) hours for 1 day.   clopidogrel 75 MG tablet Commonly known as: PLAVIX Take 1 tablet (75 mg total) by mouth daily for 21 days.   cyanocobalamin 1000 MCG tablet Take 1 tablet (1,000 mcg total) by mouth daily. Start taking on: May 23, 5461   folic acid 1 MG tablet Commonly known as: FOLVITE Take 1 tablet (1 mg total) by mouth daily.   multivitamin with minerals Tabs tablet Take 1 tablet by mouth daily.   nicotine 14 mg/24hr patch Commonly known as: NICODERM CQ - dosed in mg/24 hours Place 1 patch (14 mg total) onto the skin daily as needed (withdrawal from nicotine).   senna-docusate 8.6-50 MG tablet Commonly known as: Senokot-S Take 2 tablets by mouth at bedtime as needed for mild constipation.   thiamine 100 MG tablet Take 1 tablet (100 mg total) by mouth daily.       Follow-up Information    Garvin Fila, MD. Schedule an appointment as soon as possible for a visit in 3 week(s).   Specialties: Neurology, Radiology Contact information: [address] [place] [address] [phone]              No Known Allergies  You were cared for by a hospitalist during your hospital stay. If you have any questions about your discharge medications or the care you received while you were in the hospital after you are discharged, you can call the unit and asked to speak with the  hospitalist on call if the hospitalist that took care of you is not available. Once you are discharged, your primary care physician will handle any further medical issues. Please note that no refills for any discharge medications will be authorized once you are discharged, as it is imperative that you return to your primary care physician (or establish a relationship with a primary care physician if you do not have one) for your aftercare needs so that they can reassess your need for medications and monitor your lab values.   Procedures/Studies: CT Head Wo Contrast  Result Date: 05/14/2020 CLINICAL DATA:  Delirium. Additional provided: Increasing generalized weakness and reduced mobility for several months EXAM: CT HEAD WITHOUT CONTRAST TECHNIQUE: Contiguous axial images were obtained from the base of the skull through the vertex without intravenous contrast. COMPARISON:  No pertinent prior exams are available for comparison. FINDINGS: Brain: Moderate generalized parenchymal atrophy. Advanced ill-defined hypoattenuation within the cerebral white matter is nonspecific, but consistent with chronic small vessel ischemic disease.  There are multiple lacunar infarcts within the bilateral basal ganglia, some of which are clearly chronic and some of which are small and age-indeterminate. Small chronic appearing lacunar infarct within the right thalamus. Small chronic appearing infarct within the right cerebellum. Small chronic cortically based infarct within the paramedian right parietal lobe. No demarcated cortical infarct. No extra-axial fluid collection. No evidence of intracranial mass. No midline shift. Vascular: No hyperdense vessel.  Atherosclerotic calcifications Skull: Normal. Negative for fracture or focal lesion. Sinuses/Orbits: Visualized orbits show no acute finding. Trace ethmoid sinus mucosal thickening. No significant mastoid effusion IMPRESSION: No evidence of acute intracranial hemorrhage or acute  demarcated cortical infarct. There are multiple lacunar infarcts within the bilateral basal ganglia, some of which are clearly chronic and some of which are small and age-indeterminate. Small chronic appearing infarcts are also present within the right thalamus and right cerebellar hemisphere. Background moderate generalized parenchymal atrophy and advanced cerebral white matter chronic small vessel ischemic disease. Small chronic cortically based infarct within the paramedian right parietal lobe. Minimal ethmoid sinus mucosal thickening. Electronically Signed   By: Kellie Simmering DO   On: 05/14/2020 17:32   MR ANGIO HEAD WO CONTRAST  Result Date: 05/16/2020 CLINICAL DATA:  Stroke follow-up EXAM: MRI HEAD WITHOUT CONTRAST MRA HEAD WITHOUT CONTRAST TECHNIQUE: Multiplanar, multiecho pulse sequences of the brain and surrounding structures were obtained without intravenous contrast. Angiographic images of the head were obtained using MRA technique without contrast. COMPARISON:  05/15/2020 FINDINGS: MRI HEAD FINDINGS Brain: Restricted diffusion in the right frontal white matter (corona radiata). Additional area of fainter restricted diffusion in the right periatrial white matter and the left periatrial white matter (serise 4, images 28 and 25). Mild associated edema without substantial mass effect. There are numerous small remote lacunar infarcts in bilateral basal ganglia and bilateral thalami. There are additional small remote white matter infarcts bilaterally and small bilateral cerebellar lacunar infarcts. There is moderate to advanced suprimposed T2/FLAIR hyperintensity in the white matter, compatible with chronic microvascular ischemic disease. Moderate diffuse cerebral atrophy with ex vacuo ventricular dilation. No hydrocephalus. There are numerous punctate areas of susceptibility artifact, predominantly involving supratentorial cortex, compatible with prior microhemorrhages. More focal prior linear  susceptibility artifact involving the left external capsule, compatible with an additional site of prior hemorrhage. Skull and upper cervical spine: No focal marrow replacing lesion. Sinuses/Orbits: Negative. Other: No mastoid effusion. MRA HEAD FINDINGS No evidence of significant (greater than 50%) stenosis or occlusion involving the anterior circulation. 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 Moderate stenosis of the left P1 posterior cerebral artery. A left posterior communicating artery is present. The right posterior communicator artery is hypoplastic or absent. IMPRESSION: 1. Acute infarct in the right corona radiata. Additional smaller acute or subacute infarcts in the right periatrial and left parietal white matter. Involvement of multiple vascular territories suggests a central embolic origin. 2. Advanced chronic microvascular ischemic disease with numerous infratentorial and supratentorial remote lacunar infarcts. 3. Numerous prior cortical microhemorrhages, which may relate to the patient's atrial fibrillation (favored) or amyloid angiopathy. Additional remote left basal ganglia hemorrhage. 4. Moderate stenosis of the left P1 PCA. Otherwise, no significant (greater than 50%) arterial stenosis or occlusion identified. 5. 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 Critical Value/emergent results were called by telephone at the time of interpretation on 05/16/2020 at 3:30 pm to provider  Dr. Reesa Chew, who verbally acknowledged these results. Electronically Signed   By: Margaretha Sheffield MD   On: 05/16/2020 15:34   MR ANGIO HEAD WO CONTRAST  Result Date: 05/15/2020 CLINICAL DATA:  Neuro deficit, acute, stroke suspected. EXAM: MRA HEAD WITHOUT CONTRAST TECHNIQUE: Angiographic images of the Circle of Willis were obtained using MRA technique  without intravenous contrast. COMPARISON:  Brain MRI 05/14/2020, head CT 05/14/2020. FINDINGS: The intracranial internal carotid arteries are patent. The M1 middle cerebral arteries are patent without significant stenosis. No M2 proximal branch occlusion or high-grade proximal stenosis is identified. The anterior cerebral arteries are patent. The intracranial vertebral arteries are patent. The basilar artery is patent. The posterior cerebral arteries are patent. Moderate stenoses within the left posterior cerebral artery at the P1/P2 junction and P3/P4 junction. A left posterior communicating artery is present. The right posterior communicating artery is hypoplastic or absent. 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 IMPRESSION: No intracranial large vessel occlusion. Moderate stenoses are present within the left posterior cerebral artery at the P1/P2 junction and P3/P4 junction. 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 Electronically Signed   By: Kellie Simmering DO   On: 05/15/2020 09:25   MR BRAIN WO CONTRAST  Result Date: 05/16/2020 CLINICAL DATA:  Stroke follow-up EXAM: MRI HEAD WITHOUT CONTRAST MRA HEAD WITHOUT CONTRAST TECHNIQUE: Multiplanar, multiecho pulse sequences of the brain and surrounding structures were obtained without intravenous contrast. Angiographic images of the head were obtained using MRA technique without contrast. COMPARISON:  05/15/2020 FINDINGS: MRI HEAD FINDINGS Brain: Restricted diffusion in the right frontal white matter (corona radiata). Additional area of fainter restricted diffusion in the right periatrial white matter and the left periatrial white matter (serise 4, images 28 and 25). Mild associated edema without substantial mass effect. There are numerous small remote lacunar infarcts in bilateral basal ganglia and  bilateral thalami. There are additional small remote white matter infarcts bilaterally and small bilateral cerebellar lacunar infarcts. There is moderate to advanced suprimposed T2/FLAIR hyperintensity in the white matter, compatible with chronic microvascular ischemic disease. Moderate diffuse cerebral atrophy with ex vacuo ventricular dilation. No hydrocephalus. There are numerous punctate areas of susceptibility artifact, predominantly involving supratentorial cortex, compatible with prior microhemorrhages. More focal prior linear susceptibility artifact involving the left external capsule, compatible with an additional site of prior hemorrhage. Skull and upper cervical spine: No focal marrow replacing lesion. Sinuses/Orbits: Negative. Other: No mastoid effusion. MRA HEAD FINDINGS No evidence of significant (greater than 50%) stenosis or occlusion involving the anterior circulation. 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 Moderate stenosis of the left P1 posterior cerebral artery. A left posterior communicating artery is present. The right posterior communicator artery is hypoplastic or absent. IMPRESSION: 1. Acute infarct in the right corona radiata. Additional smaller acute or subacute infarcts in the right periatrial and left parietal white matter. Involvement of multiple vascular territories suggests a central embolic origin. 2. Advanced chronic microvascular ischemic disease with numerous infratentorial and supratentorial remote lacunar infarcts. 3. Numerous prior cortical microhemorrhages, which may relate to the patient's atrial fibrillation (favored) or amyloid angiopathy. Additional remote left basal ganglia hemorrhage. 4. Moderate stenosis of the left P1 PCA. Otherwise, no significant (greater than 50%) arterial stenosis or occlusion identified. 5. Similar 3 mm  saccular aneurysm arising from the  right aspect of the anterior communicating artery complex. Critical Value/emergent results were called by telephone at the time of interpretation on 05/16/2020 at 3:30 pm to provider Dr. Reesa Chew, who verbally acknowledged these results. Electronically Signed   By: Margaretha Sheffield MD   On: 05/16/2020 15:34   MR Brain Wo Contrast (neuro protocol)  Result Date: 05/14/2020 CLINICAL DATA:  Weakness and decreased mobility EXAM: MRI HEAD WITHOUT CONTRAST TECHNIQUE: Multiplanar, multiecho pulse sequences of the brain and surrounding structures were obtained without intravenous contrast. COMPARISON:  None. FINDINGS: The examination was discontinued prior to completion due to patient altered mental status and inability to cooperate with the technologist's instructions. Axial diffusion-weighted imaging only was obtained. There are multiple small areas of hyperintensity on diffusion-weighted imaging, including the right frontal white matter, right periatrial white matter and left temporal lobe. No midline shift or other mass effect. IMPRESSION: 1. Truncated examination due to patient altered mental status and inability to cooperate with the technologist's instructions. 2. Multiple small areas of abnormal diffusivity within the right frontal white matter, right periatrial white matter and left temporal lobe. These are most likely small infarcts, possibly from an embolic source. Electronically Signed   By: Ulyses Jarred M.D.   On: 05/14/2020 21:46   DG Chest Port 1 View  Result Date: 05/14/2020 CLINICAL DATA:  Weakness.  Confusion.  Cough. EXAM: PORTABLE CHEST 1 VIEW COMPARISON:  None. FINDINGS: Numerous leads and wires project over the chest. Midline trachea. Borderline cardiomegaly. Aortic atherosclerosis. Left costophrenic angle minimally excluded. No pleural effusion or pneumothorax. Clear lungs. IMPRESSION: No acute cardiopulmonary disease. Electronically Signed   By: Abigail Miyamoto M.D.   On: 05/14/2020 17:24   DG  Swallowing Func-Speech Pathology  Result Date: 05/16/2020 Objective Swallowing Evaluation: Type of Study: MBS-Modified Barium Swallow Study  Patient Details Name: Tyson Masin MRN: [id number] Date of Birth: 21-Dec-1945 Today's Date: 05/16/2020 Time: SLP Start Time (ACUTE ONLY): 6948 -SLP Stop Time (ACUTE ONLY): 0952 SLP Time Calculation (min) (ACUTE ONLY): 18 min Past Medical History: No past medical history on file. Past Surgical History: No past surgical history on file. HPI: 74 y.o. female with medical history significant of no significant past medical history who was brought in by EMS due to generalized weakness over the last 3 days. Per chart patient apparently has been declining in the last few months. MRI multiple small areas of abnormal diffusivity within the right frontal white matter, right periatrial white matter and left temporal lobe.  Subjective: alert, cooperative, slow processing Assessment / Plan / Recommendation CHL IP CLINICAL IMPRESSIONS 05/16/2020 Clinical Impression Pt has a moderate oropharyngeal dysphagia with oral intake prolonged regardless of consistency. She has prolonged lingual rocking to try to propel boluses posteriorly, with reduced control and posterior loss into the pharynx as she tries to clear her oral cavity. There is diffuse oral residue that she eventually clears with cues. Premature spillage of thin liquids enters the laryngeal vestibule before the swallow with intermittent aspiration (cough only x1) and penetration to the vocal folds that cannot be cleared for the remainder of the study as her cued cough is weak. She has an anterior curvature of her spine that narrows her pharyngeal space and her pharyngeal squeeze and base of tongue retraction are weak, so there is residue in that increases as consistencies become thicker. Despite an increase in residue she has improved airway protection with nectar thick liquids and purees. There is some trace but transient penetration with  both textures. Pt also has an outpouching in her cervical esophagus in which barium collects (appearance of a diverticulum) and at times this results in mild backflow into the pyriform sinuses. Pt had a difficult time attempting strategies throughout the study, such as chin tuck and effort swallow, making it less likely to be utilized across meals. Recommend that pt remain on Dys 1 diet with nectar thick liquids with additional SLP f/u to trial therapeutic exercise.  SLP Visit Diagnosis Dysphagia, oropharyngeal phase (R13.12) Attention and concentration deficit following -- Frontal lobe and executive function deficit following -- Impact on safety and function Moderate aspiration risk   CHL IP TREATMENT RECOMMENDATION 05/16/2020 Treatment Recommendations Therapy as outlined in treatment plan below   Prognosis 05/16/2020 Prognosis for Safe Diet Advancement Good Barriers to Reach Goals Cognitive deficits;Severity of deficits Barriers/Prognosis Comment -- CHL IP DIET RECOMMENDATION 05/16/2020 SLP Diet Recommendations Dysphagia 1 (Puree) solids;Nectar thick liquid Liquid Administration via Cup Medication Administration Crushed with puree Compensations Slow rate;Minimize environmental distractions;Small sips/bites;Clear throat intermittently Postural Changes Seated upright at 90 degrees;Remain semi-upright after after feeds/meals (Comment)   CHL IP OTHER RECOMMENDATIONS 05/16/2020 Recommended Consults -- Oral Care Recommendations Oral care BID Other Recommendations Order thickener from pharmacy;Prohibited food (jello, ice cream, thin soups);Remove water pitcher   CH[facility] IP FOLLOW UP RECOMMENDATIONS 05/16/2020 Follow up Recommendations Skilled Nursing facility   [facility] IP FREQUENCY AND DURATION 05/16/2020 Speech Therapy Frequency (ACUTE ONLY) min 2x/week Treatment Duration 2 weeks      CHL IP ORAL PHASE 05/16/2020 Oral Phase Impaired Oral - Pudding Teaspoon -- Oral - Pudding Cup -- Oral - Honey Teaspoon -- Oral - Honey Cup -- Oral - Nectar  Teaspoon -- Oral - Nectar Cup Weak lingual manipulation;Lingual pumping;Reduced posterior propulsion;Lingual/palatal residue;Delayed oral transit;Decreased bolus cohesion;Premature spillage Oral - Nectar Straw -- Oral - Thin Teaspoon -- Oral - Thin Cup Weak lingual manipulation;Lingual pumping;Reduced posterior propulsion;Lingual/palatal residue;Delayed oral transit;Decreased bolus cohesion;Premature spillage Oral - Thin Straw -- Oral - Puree Weak lingual manipulation;Lingual pumping;Reduced posterior propulsion;Lingual/palatal residue;Delayed oral transit;Decreased bolus cohesion;Premature spillage Oral - Mech Soft -- Oral - Regular -- Oral - Multi-Consistency -- Oral - Pill -- Oral Phase - Comment --  CHL IP PHARYNGEAL PHASE 05/16/2020 Pharyngeal Phase Impaired Pharyngeal- Pudding Teaspoon -- Pharyngeal -- Pharyngeal- Pudding Cup -- Pharyngeal -- Pharyngeal- Honey Teaspoon -- Pharyngeal -- Pharyngeal- Honey Cup -- Pharyngeal -- Pharyngeal- Nectar Teaspoon -- Pharyngeal -- Pharyngeal- Nectar Cup Reduced pharyngeal peristalsis;Reduced anterior laryngeal mobility;Reduced tongue base retraction;Pharyngeal residue - valleculae;Pharyngeal residue - pyriform;Penetration/Aspiration before swallow Pharyngeal Material enters airway, remains ABOVE vocal cords then ejected out Pharyngeal- Nectar Straw -- Pharyngeal -- Pharyngeal- Thin Teaspoon -- Pharyngeal -- Pharyngeal- Thin Cup Reduced pharyngeal peristalsis;Reduced anterior laryngeal mobility;Reduced tongue base retraction;Pharyngeal residue - valleculae;Pharyngeal residue - pyriform;Penetration/Aspiration before swallow Pharyngeal Material enters airway, passes BELOW cords without attempt by patient to eject out (silent aspiration);Material enters airway, passes BELOW cords and not ejected out despite cough attempt by patient Pharyngeal- Thin Straw -- Pharyngeal -- Pharyngeal- Puree Reduced pharyngeal peristalsis;Reduced anterior laryngeal mobility;Reduced tongue base  retraction;Pharyngeal residue - valleculae;Pharyngeal residue - pyriform;Penetration/Aspiration before swallow Pharyngeal Material enters airway, remains ABOVE vocal cords then ejected out Pharyngeal- Mechanical Soft -- Pharyngeal -- Pharyngeal- Regular -- Pharyngeal -- Pharyngeal- Multi-consistency -- Pharyngeal -- Pharyngeal- Pill -- Pharyngeal -- Pharyngeal Comment --  CHL IP CERVICAL ESOPHAGEAL PHASE 05/16/2020 Cervical Esophageal Phase Impaired Pudding Teaspoon -- Pudding Cup -- Honey Teaspoon -- Honey Cup -- Nectar Teaspoon -- Nectar Cup Esophageal backflow into the pharynx Nectar  Straw -- Thin Teaspoon -- Thin Cup Esophageal backflow into the pharynx Thin Straw -- Puree Esophageal backflow into the pharynx Mechanical Soft -- Regular -- Multi-consistency -- Pill -- Cervical Esophageal Comment -- Osie Bond., M.A. [facility] Pager [phone] Office [phone] 05/16/2020, 11:02 AM              ECHOCARDIOGRAM COMPLETE  Result Date: 05/15/2020    ECHOCARDIOGRAM REPORT   Patient Name:   CAMIAH HUMM Date of Exam: 05/15/2020 Medical Rec #:  [id number]     Height:       65.0 in Accession #:    [id number]    Weight:       175.0 lb Date of Birth:  Feb 09, 1946     BSA:          1.869 m Patient Age:    33 years      BP:           119/79 mmHg Patient Gender: F             HR:           75 bpm. Exam Location:  Inpatient Procedure: 2D Echo Indications:    stroke 434.91  History:        Patient has no prior history of Echocardiogram examinations.                 Risk Factors:Current Smoker.  Sonographer:    Jannett Celestine RDCS (AE) Referring Phys: [id number] [place]  Sonographer Comments: restricted mobility IMPRESSIONS  1. Left ventricular ejection fraction, by estimation, is 65 to 70%. The left ventricle has normal function. The left ventricle has no regional wall motion abnormalities. There is severe concentric left ventricular hypertrophy. Left ventricular diastolic  parameters are  consistent with Grade I diastolic dysfunction (impaired relaxation).  2. Right ventricular systolic function is normal. The right ventricular size is normal. Tricuspid regurgitation signal is inadequate for assessing PA pressure.  3. The mitral valve is normal in structure. No evidence of mitral valve regurgitation.  4. The aortic valve is normal in structure. Aortic valve regurgitation is mild to moderate. FINDINGS  Left Ventricle: Left ventricular ejection fraction, by estimation, is 65 to 70%. The left ventricle has normal function. The left ventricle has no regional wall motion abnormalities. The left ventricular internal cavity size was normal in size. There is  severe concentric left ventricular hypertrophy. Left ventricular diastolic parameters are consistent with Grade I diastolic dysfunction (impaired relaxation). Indeterminate filling pressures. Right Ventricle: The right ventricular size is normal. No increase in right ventricular wall thickness. Right ventricular systolic function is normal. Tricuspid regurgitation signal is inadequate for assessing PA pressure. Left Atrium: Left atrial size was normal in size. Right Atrium: Right atrial size was normal in size. Pericardium: There is no evidence of pericardial effusion. Mitral Valve: The mitral valve is normal in structure. No evidence of mitral valve regurgitation. Tricuspid Valve: The tricuspid valve is normal in structure. Tricuspid valve regurgitation is trivial. Aortic Valve: The aortic valve is normal in structure. Aortic valve regurgitation is mild to moderate. Aortic regurgitation PHT measures 411 msec. Pulmonic Valve: The pulmonic valve was grossly normal. Pulmonic valve regurgitation is not visualized. Aorta: The aortic root and ascending aorta are structurally normal, with no evidence of dilitation. Venous: The inferior vena cava was not well visualized. IAS/Shunts: No atrial level shunt detected by color flow Doppler.  LEFT VENTRICLE PLAX 2D  LVIDd:         3.10  cm  Diastology LVIDs:         2.25 cm  LV e' lateral:   3.15 cm/s LV PW:         1.70 cm  LV E/e' lateral: 13.2 LV IVS:        1.70 cm  LV e' medial:    3.15 cm/s LVOT diam:     2.00 cm  LV E/e' medial:  13.2 LV SV:         57 LV SV Index:   31 LVOT Area:     3.14 cm  LEFT ATRIUM             Index       RIGHT ATRIUM           Index LA diam:        2.90 cm 1.55 cm/m  RA Area:     13.70 cm LA Vol (A2C):   25.8 ml 13.80 ml/m RA Volume:   30.20 ml  16.16 ml/m LA Vol (A4C):   18.4 ml 9.84 ml/m LA Biplane Vol: 21.8 ml 11.66 ml/m  AORTIC VALVE LVOT Vmax:   98.60 cm/s LVOT Vmean:  76.100 cm/s LVOT VTI:    0.183 m AI PHT:      411 msec  AORTA Ao Root diam: 3.30 cm MITRAL VALVE MV Area (PHT): 3.19 cm    SHUNTS MV Decel Time: 238 msec    Systemic VTI:  0.18 m MV E velocity: 41.60 cm/s  Systemic Diam: 2.00 cm MV A velocity: 84.40 cm/s MV E/A ratio:  0.49 Mihai Croitoru MD Electronically signed by Sanda Klein MD Signature Date/Time: 05/15/2020/6:14:42 PM    Final    VAS US CAROTID  Result Date: 05/15/2020 Carotid Arterial Duplex Study Indications: Weakness and confusion. Performing Technologist: Griffin Basil RCT RDMS  Examination Guidelines: A complete evaluation includes B-mode imaging, spectral Doppler, color Doppler, and power Doppler as needed of all accessible portions of each vessel. Bilateral testing is considered an integral part of a complete examination. Limited examinations for reoccurring indications may be performed as noted.  Right Carotid Findings: +----------+--------+--------+--------+------------------+------------------+           PSV cm/sEDV cm/sStenosisPlaque DescriptionComments           +----------+--------+--------+--------+------------------+------------------+ CCA Prox  159     4                                                    +----------+--------+--------+--------+------------------+------------------+ CCA Distal81      10                                 intimal thickening +----------+--------+--------+--------+------------------+------------------+ ICA Prox  112     23      1-39%                     intimal thickening +----------+--------+--------+--------+------------------+------------------+ ICA Distal104     22                                                   +----------+--------+--------+--------+------------------+------------------+ ECA       158     3                                                    +----------+--------+--------+--------+------------------+------------------+ +----------+--------+-------+--------+-------------------+  PSV cm/sEDV cmsDescribeArm Pressure (mmHG) +----------+--------+-------+--------+-------------------+ [id number]     2                                  +----------+--------+-------+--------+-------------------+ +---------+--------+---+--------+--+---------+ VertebralPSV cm/s118EDV cm/s13Antegrade +---------+--------+---+--------+--+---------+  Left Carotid Findings: +----------+--------+--------+--------+--------------------+-------------------+           PSV cm/sEDV cm/sStenosisPlaque Description  Comments            +----------+--------+--------+--------+--------------------+-------------------+ CCA Prox  85      11                                                      +----------+--------+--------+--------+--------------------+-------------------+ CCA Distal104     13                                  intimal thickening  +----------+--------+--------+--------+--------------------+-------------------+ ICA Prox  319     36      60-79%  focal, hyperechoic  High resistant flow                                   and homogeneous                         +----------+--------+--------+--------+--------------------+-------------------+ ICA Distal126     12                                  intimal thickening   +----------+--------+--------+--------+--------------------+-------------------+ ECA       278     5       >50%    focal and                                                                 hyperechoic                             +----------+--------+--------+--------+--------------------+-------------------+ +----------+--------+--------+--------+-------------------+           PSV cm/sEDV cm/sDescribeArm Pressure (mmHG) +----------+--------+--------+--------+-------------------+ Subclavian132     2                                   +----------+--------+--------+--------+-------------------+ +---------+--------+---+--------+-+----------+ VertebralPSV cm/s107EDV cm/s3Retrograde +---------+--------+---+--------+-+----------+   Summary: Right Carotid: Velocities in the right ICA are consistent with a 1-39% stenosis. Left Carotid: Velocities in the left ICA are consistent with a 60-79% stenosis.               The ECA appears >50% stenosed. Vertebrals: Right vertebral artery demonstrates antegrade flow. Left vertebral             artery demonstrates retrograde flow. *See table(s) above for measurements and observations.  Electronically signed by Antony Contras MD on 05/15/2020 at 1:10:29 PM.    Final  The results of significant diagnostics from this hospitalization (including imaging, microbiology, ancillary and laboratory) are listed below for reference.     Microbiology: Recent Results (from the past 240 hour(s))  Urine culture     Status: Abnormal   Collection Time: 05/14/20  6:00 PM   Specimen: Urine, Catheterized  Result Value Ref Range Status   Specimen Description   Final    URINE, CATHETERIZED Performed at [facility] [address], [place], [place] [address]    Special Requests   Final    NONE Performed at [facility], [place] [address], [place], [place] [address]    Culture >=100,000 COLONIES/mL ESCHERICHIA COLI (A)   Final   Report Status 05/16/2020 FINAL  Final   Organism ID, Bacteria ESCHERICHIA COLI (A)  Final      Susceptibility   Escherichia coli - MIC*    AMPICILLIN <=2 SENSITIVE Sensitive     CEFAZOLIN <=4 SENSITIVE Sensitive     CEFTRIAXONE <=0.25 SENSITIVE Sensitive     CIPROFLOXACIN <=0.25 SENSITIVE Sensitive     GENTAMICIN <=1 SENSITIVE Sensitive     IMIPENEM <=0.25 SENSITIVE Sensitive     NITROFURANTOIN <=16 SENSITIVE Sensitive     TRIMETH/SULFA <=20 SENSITIVE Sensitive     AMPICILLIN/SULBACTAM <=2 SENSITIVE Sensitive     PIP/TAZO <=4 SENSITIVE Sensitive     * >=100,000 COLONIES/mL ESCHERICHIA COLI  SARS Coronavirus 2 by RT PCR (hospital order, performed in [facility] hospital lab) Nasopharyngeal Nasopharyngeal Swab     Status: None   Collection Time: 05/14/20  8:52 PM   Specimen: Nasopharyngeal Swab  Result Value Ref Range Status   SARS Coronavirus 2 NEGATIVE NEGATIVE Final    Comment: (NOTE) SARS-CoV-2 target nucleic acids are NOT DETECTED.  The SARS-CoV-2 RNA is generally detectable in upper and lower respiratory specimens during the acute phase of infection. The lowest concentration of SARS-CoV-2 viral copies this assay can detect is 250 copies / mL. A negative result does not preclude SARS-CoV-2 infection and should not be used as the sole basis for treatment or other patient management decisions.  A negative result may occur with improper specimen collection / handling, submission of specimen other than nasopharyngeal swab, presence of viral mutation(s) within the areas targeted by this assay, and inadequate number of viral copies (<250 copies / mL). A negative result must be combined with clinical observations, patient history, and epidemiological information.  Fact Sheet for Patients:   StrictlyIdeas.no  Fact Sheet for Healthcare Providers: BankingDealers.co.za  This test is not yet approved or  cleared by the [place] FDA and has been authorized for detection and/or diagnosis of SARS-CoV-2 by FDA under an Emergency Use Authorization (EUA).  This EUA will remain in effect (meaning this test can be used) for the duration of the COVID-19 declaration under Section 564(b)(1) of the Act, 21 U.S.C. section 360bbb-3(b)(1), unless the authorization is terminated or revoked sooner.  Performed at [facility], [place] [address], [place], [place] [address]   SARS Coronavirus 2 by RT PCR (hospital order, performed in [facility] hospital lab) Nasopharyngeal Nasopharyngeal Swab     Status: None   Collection Time: 05/17/20  4:48 PM   Specimen: Nasopharyngeal Swab  Result Value Ref Range Status   SARS Coronavirus 2 NEGATIVE NEGATIVE Final    Comment: (NOTE) SARS-CoV-2 target nucleic acids are NOT DETECTED.  The SARS-CoV-2 RNA is generally detectable in upper and lower respiratory specimens during the acute phase of infection. The  lowest concentration of SARS-CoV-2 viral copies this assay can detect is 250 copies / mL. A negative result does not preclude SARS-CoV-2 infection and should not be used as the sole basis for treatment or other patient management decisions.  A negative result may occur with improper specimen collection / handling, submission of specimen other than nasopharyngeal swab, presence of viral mutation(s) within the areas targeted by this assay, and inadequate number of viral copies (<250 copies / mL). A negative result must be combined with clinical observations, patient history, and epidemiological information.  Fact Sheet for Patients:   StrictlyIdeas.no  Fact Sheet for Healthcare Providers: BankingDealers.co.za  This test is not yet approved or  cleared by the [place] FDA and has been authorized for detection and/or diagnosis of SARS-CoV-2 by FDA under an Emergency Use Authorization (EUA).  This EUA will  remain in effect (meaning this test can be used) for the duration of the COVID-19 declaration under Section 564(b)(1) of the Act, 21 U.S.C. section 360bbb-3(b)(1), unless the authorization is terminated or revoked sooner.  Performed at [facility], [place] [address] [place],  [address]      Labs: BNP (last 3 results) No results for input(s): BNP in the last 8760 hours. Basic Metabolic Panel: Recent Labs  Lab 05/14/20 1605 05/15/20 0415 05/16/20 0351 05/17/20 1204  NA 143 138 142 142  K 3.9 5.6* 3.5 3.6  CL 110 106 111 114*  CO2 24 18* 21* 19*  GLUCOSE 137* 165* 129* 159*  BUN 17 20 17 14   CREATININE 1.17* 1.25* 1.12* 1.30*  CALCIUM 8.5* 8.5* 8.4* 8.2*  MG  --   --  2.0 2.1   Liver Function Tests: Recent Labs  Lab 05/14/20 1605 05/15/20 0415  AST 25 34  ALT 13 10  ALKPHOS 114 100  BILITOT 0.6 1.0  PROT 7.0 6.7  ALBUMIN 3.7 3.5   No results for input(s): LIPASE, AMYLASE in the last 168 hours. Recent Labs  Lab 05/15/20 0747  AMMONIA 41*   CBC: Recent Labs  Lab 05/14/20 1605 05/15/20 0415 05/15/20 1106 05/16/20 0351 05/17/20 1204  WBC 12.1* 13.5*  --  10.2 8.1  NEUTROABS 10.5*  --   --   --   --   HGB 13.5 14.6  --  12.1 12.0  HCT 41.2 45.2 25.8* 37.3 37.7  MCV 96.7 98.5  --  98.4 100.0  PLT 269 260  --  225 230   Cardiac Enzymes: No results for input(s): CKTOTAL, CKMB, CKMBINDEX, TROPONINI in the last 168 hours. BNP: Invalid input(s): POCBNP CBG: Recent Labs  Lab 05/16/20 1555 05/16/20 2240 05/17/20 0627 05/17/20 1339 05/17/20 1718  GLUCAP 122* 95 132* 149* 120*   D-Dimer No results for input(s): DDIMER in the last 72 hours. Hgb A1c No results for input(s): HGBA1C in the last 72 hours. Lipid Profile Recent Labs    05/16/20 1921  CHOL 155  HDL 30*  LDLCALC 99  TRIG 132  CHOLHDL 5.2   Thyroid function studies No results for input(s): TSH, T4TOTAL, T3FREE, THYROIDAB in the last 72 hours.  Invalid input(s):  FREET3 Anemia work up Recent Labs    05/15/20 1106  VITAMINB12 133*   Urinalysis    Component Value Date/Time   COLORURINE STRAW (A) 05/14/2020 1800   APPEARANCEUR TURBID (A) 05/14/2020 1800   LABSPEC 1.010 05/14/2020 1800   PHURINE 6.0 05/14/2020 1800   GLUCOSEU NEGATIVE 05/14/2020 1800   HGBUR SMALL (A) 05/14/2020 1800   BILIRUBINUR  NEGATIVE 05/14/2020 1800   KETONESUR 20 (A) 05/14/2020 1800   PROTEINUR 100 (A) 05/14/2020 1800   NITRITE NEGATIVE 05/14/2020 1800   LEUKOCYTESUR LARGE (A) 05/14/2020 1800   Sepsis Labs Invalid input(s): PROCALCITONIN,  WBC,  LACTICIDVEN Microbiology Recent Results (from the past 240 hour(s))  Urine culture     Status: Abnormal   Collection Time: 05/14/20  6:00 PM   Specimen: Urine, Catheterized  Result Value Ref Range Status   Specimen Description   Final    URINE, CATHETERIZED Performed at [facility], [place] [address] [place], [place] [address]    Special Requests   Final    NONE Performed at [facility] [place] [address], [place], [place] [address]    Culture >=100,000 COLONIES/mL ESCHERICHIA COLI (A)  Final   Report Status 05/16/2020 FINAL  Final   Organism ID, Bacteria ESCHERICHIA COLI (A)  Final      Susceptibility   Escherichia coli - MIC*    AMPICILLIN <=2 SENSITIVE Sensitive     CEFAZOLIN <=4 SENSITIVE Sensitive     CEFTRIAXONE <=0.25 SENSITIVE Sensitive     CIPROFLOXACIN <=0.25 SENSITIVE Sensitive     GENTAMICIN <=1 SENSITIVE Sensitive     IMIPENEM <=0.25 SENSITIVE Sensitive     NITROFURANTOIN <=16 SENSITIVE Sensitive     TRIMETH/SULFA <=20 SENSITIVE Sensitive     AMPICILLIN/SULBACTAM <=2 SENSITIVE Sensitive     PIP/TAZO <=4 SENSITIVE Sensitive     * >=100,000 COLONIES/mL ESCHERICHIA COLI  SARS Coronavirus 2 by RT PCR (hospital order, performed in [place] hospital lab) Nasopharyngeal Nasopharyngeal Swab     Status: None   Collection Time: 05/14/20  8:52 PM   Specimen:  Nasopharyngeal Swab  Result Value Ref Range Status   SARS Coronavirus 2 NEGATIVE NEGATIVE Final    Comment: (NOTE) SARS-CoV-2 target nucleic acids are NOT DETECTED.  The SARS-CoV-2 RNA is generally detectable in upper and lower respiratory specimens during the acute phase of infection. The lowest concentration of SARS-CoV-2 viral copies this assay can detect is 250 copies / mL. A negative result does not preclude SARS-CoV-2 infection and should not be used as the sole basis for treatment or other patient management decisions.  A negative result may occur with improper specimen collection / handling, submission of specimen other than nasopharyngeal swab, presence of viral mutation(s) within the areas targeted by this assay, and inadequate number of viral copies (<250 copies / mL). A negative result must be combined with clinical observations, patient history, and epidemiological information.  Fact Sheet for Patients:   StrictlyIdeas.no  Fact Sheet for Healthcare Providers: BankingDealers.co.za  This test is not yet approved or  cleared by the [place] FDA and has been authorized for detection and/or diagnosis of SARS-CoV-2 by FDA under an Emergency Use Authorization (EUA).  This EUA will remain in effect (meaning this test can be used) for the duration of the COVID-19 declaration under Section 564(b)(1) of the Act, 21 U.S.C. section 360bbb-3(b)(1), unless the authorization is terminated or revoked sooner.  Performed at [facility], [place] [address] [place], [place] [address]   SARS Coronavirus 2 by RT PCR (hospital order, performed in [facility] hospital lab) Nasopharyngeal Nasopharyngeal Swab     Status: None   Collection Time: 05/17/20  4:48 PM   Specimen: Nasopharyngeal Swab  Result Value Ref Range Status   SARS Coronavirus 2 NEGATIVE NEGATIVE Final    Comment: (NOTE) SARS-CoV-2 target nucleic acids are  NOT DETECTED.  The  SARS-CoV-2 RNA is generally detectable in upper and lower respiratory specimens during the acute phase of infection. The lowest concentration of SARS-CoV-2 viral copies this assay can detect is 250 copies / mL. A negative result does not preclude SARS-CoV-2 infection and should not be used as the sole basis for treatment or other patient management decisions.  A negative result may occur with improper specimen collection / handling, submission of specimen other than nasopharyngeal swab, presence of viral mutation(s) within the areas targeted by this assay, and inadequate number of viral copies (<250 copies / mL). A negative result must be combined with clinical observations, patient history, and epidemiological information.  Fact Sheet for Patients:   StrictlyIdeas.no  Fact Sheet for Healthcare Providers: BankingDealers.co.za  This test is not yet approved or  cleared by the [place] FDA and has been authorized for detection and/or diagnosis of SARS-CoV-2 by FDA under an Emergency Use Authorization (EUA).  This EUA will remain in effect (meaning this test can be used) for the duration of the COVID-19 declaration under Section 564(b)(1) of the Act, 21 U.S.C. section 360bbb-3(b)(1), unless the authorization is terminated or revoked sooner.  Performed at [facility], [place] [address] [place], [place] [address]      Time coordinating discharge:  I have spent 35 minutes face to face with the patient and on the ward discussing the patients care, assessment, plan and disposition with other care givers. >50% of the time was devoted counseling the patient about the risks and benefits of treatment/Discharge disposition and coordinating care.   SIGNED:   Damita Lack, MD  [facility] 05/18/2020, 9:25 AM   If 7PM-7AM, please contact night-coverage

## 2020-05-18 NOTE — TOC Transition Note (Signed)
Transition of Care [facility]) - CM/SW Discharge Note   Patient Details  Name: Melinda Olson MRN: [id number] Date of Birth: [birth year]  [facility] of [facility] CM/SW Contact:  Varney Baas Phone Number: 05/18/2020, 9:43 AM   Clinical Narrative:    Patient will DC to: [facility] Transport by: Corey Harold  RN, patient, patient's family, and facility notified of DC. Discharge Summary and FL2 sent to facility. RN to call report prior to discharge [phone] Rm [id number]). DC packet on chart.   CSW will sign off for now as social work intervention is no longer needed. Please consult [place] again if new needs arise.    Final next level of care: Skilled Nursing Facility Barriers to Discharge: No Barriers Identified   Patient Goals and CMS Choice Patient states their goals for this hospitalization and ongoing recovery are:: patient unable to participate in goal setting due to disorientation CMS Medicare.gov Compare Post Acute Care list provided to:: Patient Represenative (must comment) [name]) Choice offered to / list presented to : Adult Children  Discharge Placement              Patient [facility] bed at: [facility] Patient to be transferred to facility by: [place] Name of family member notified: Tori Dattilio Patient and family notified of of transfer: 05/18/20  Discharge Plan and Services     Post Acute Care Choice: [place]                               Social Determinants of Health (SDOH) Interventions     Readmission Risk Interventions No flowsheet data found.

## 2020-09-14 DIAGNOSIS — N390 Urinary tract infection, site not specified: Secondary | ICD-10-CM

## 2020-09-14 HISTORY — DX: Urinary tract infection, site not specified: N39.0

## 2020-09-23 ENCOUNTER — Ambulatory Visit (INDEPENDENT_AMBULATORY_CARE_PROVIDER_SITE_OTHER): Payer: Medicare Other | Admitting: Neurology

## 2020-09-23 ENCOUNTER — Encounter: Payer: Self-pay | Admitting: Neurology

## 2020-09-23 ENCOUNTER — Other Ambulatory Visit: Payer: Self-pay

## 2020-09-23 VITALS — BP 180/82 | HR 88 | Ht 60.0 in | Wt 107.6 lb

## 2020-09-23 DIAGNOSIS — F0280 Dementia in other diseases classified elsewhere without behavioral disturbance: Secondary | ICD-10-CM

## 2020-09-23 DIAGNOSIS — I6381 Other cerebral infarction due to occlusion or stenosis of small artery: Secondary | ICD-10-CM | POA: Diagnosis not present

## 2020-09-23 DIAGNOSIS — G309 Alzheimer's disease, unspecified: Secondary | ICD-10-CM | POA: Diagnosis not present

## 2020-09-23 DIAGNOSIS — F0150 Vascular dementia without behavioral disturbance: Secondary | ICD-10-CM | POA: Diagnosis not present

## 2020-09-23 DIAGNOSIS — E538 Deficiency of other specified B group vitamins: Secondary | ICD-10-CM | POA: Diagnosis not present

## 2020-09-23 MED ORDER — MEMANTINE HCL 28 X 5 MG & 21 X 10 MG PO TABS: ORAL_TABLET | ORAL | 12 refills | Status: DC

## 2020-09-23 MED ORDER — MEMANTINE HCL 10 MG PO TABS: 10.0000 mg | ORAL_TABLET | Freq: Two times a day (BID) | ORAL | 3 refills | Status: AC

## 2020-09-23 NOTE — Patient Instructions (Signed)
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  She was also found to have vitamin B12 deficiency and urinary tract infection with treatment of both conditions has not necessarily led to any cognitive improvement hence I recommend treatment trial of Namenda starter pack and if tolerated 10 mg twice daily.  Check EEG for seizures.  Continue aspirin for stroke prevention with strict control of hypertension with blood pressure goal below 130/90, lipids with LDL cholesterol goal below 70 mg percent and diabetes with hemoglobin A1c goal below 6.5%.  Continue ongoing home physical and occupational therapy and she will need 24-hour supervision at home.  She will return for follow-up in 3 months or call earlier if necessary.  Alzheimer's Disease Caregiver Guide Alzheimer's disease is a condition that makes a person:  Forget things.  Act differently.  Have trouble paying attention and doing simple tasks. These things get worse with time. The tips below can help you care for the person. How to help manage lifestyle changes Tips to help with symptoms  Be calm and patient.  Give simple, short answers to questions.  Avoid correcting the person in a negative way.  Try not to take things personally, even if the person forgets your name.  Do not argue with the person. This may make the person more upset. Tips to lessen frustration  Make appointments and do daily tasks when the person is at his or her best.  Take your time. Simple tasks may take longer. Allow plenty of time to complete tasks.  Limit choices for the person.  Involve the person in what you are doing.  Keep things organized: ? Keep a daily routine. ? Organize medicines in a pillbox for each day of the  week. ? Keep a calendar in a central location to remind the person of meetings or other activities.  Avoid new or crowded places, if possible.  Use simple words, short sentences, and a calm voice. Only give one direction at a time.  Buy clothes and shoes that are easy to put on and take off.  Try to change the subject if the person becomes frustrated or angry. Tips to prevent injury  Keep floors clear. Remove rugs, magazine racks, and floor lamps.  Keep hallways well-lit.  Put a handrail and non-slip mat in the bathtub or shower.  Put childproof locks on cabinets that have dangerous items in them. These items include medicine, alcohol, guns, toxic cleaning items, sharp tools, matches, and lighters.  Put locks on doors where the person cannot see or reach them. This helps keep the person from going out of the house and getting lost.  Be ready for emergencies. Keep a list of emergency phone numbers and addresses close by.  Remove car keys and lock garage doors so that the person does not try to drive.  Bracelets may be worn that track location and identify the person as having memory problems. This should be worn at all times for safety.   Tips for the future  Discuss financial and legal planning early. People with this disease have trouble managing their money as the disease gets worse. Get help from a professional.  Talk about advance directives, safety, and daily care. Take these steps: ? Create a living will and choose a power of attorney. This  is someone who can make decisions for the person with Alzheimer's disease when he or she can no longer do so. ? Discuss driving safety and when to stop driving. The person's doctor can help with this. ? If the person lives alone, make sure he or she is safe. Some people need extra help at home. Other people need more care at a nursing home or care center.   How to recognize changes in the person's condition With this disease, memory  problems and confusion slowly get worse. In time, the person may not know his or her friends and family members. The disease can also cause changes in behavior and mood, such as anxiety or anger. The person may see, hear, taste, smell, or feel things that are not real (hallucinate). These changes can come on all of a sudden. They may happen in response to something such as:  Pain.  An infection.  Changes in temperature or noise.  Too much stimulation.  Feeling lost or scared.  Medicines. Where to find support  Find out about services that can provide short-term care (respite care). These can allow you to take a break when you need it.  Join a support group near you. These groups can help you: ? Learn ways to manage stress. ? Share experiences with others. ? Get emotional comfort and support. ? Learn about caregiving as the disease gets worse. ? Know what community resources are available. Where to find more information  Alzheimer's Association: [employer] Contact a doctor if:  The person has a fever.  The person has a sudden behavior change that does not get better with calming strategies.  The person is not able to take care of himself or herself at home.  You are no longer able to care for the person. Get help right away if:  The person has a sudden increase in confusion or new hallucinations.  The person threatens you or anyone else, including himself or herself. Get help right away if you feel like your loved one may hurt himself or herself or others, or has thoughts about taking his or her own life. Go to your nearest emergency room or:  Call your local emergency services (911 in the [place]  Call the [facility] at (509) 589-6760. This is open 24 hours a day.  Text the Crisis Text Line at (365)036-4322. Summary  Alzheimer's disease causes a person to forget things.  A person who has this condition may have trouble doing simple tasks.  Take  steps to keep the person from getting hurt. Plan for future care.  You can find support by joining a support group near you. This information is not intended to replace advice given to you by your health care provider. Make sure you discuss any questions you have with your health care provider. Document Revised: 12/18/2019 Document Reviewed: 12/18/2019 Elsevier Patient Education  2021 [employer].

## 2020-09-23 NOTE — Progress Notes (Signed)
[facility] [address] [place]. [place] [address] [phone]       OFFICE FOLLOW-UP NOTE  Ms. Dashanae Longfield Date of Birth:  08/18/1946 Medical Record Number:  [id number]   HPI: Ms. Polizzi is a 75 year old Caucasian lady seen today for initial office follow-up visit following hospital consultation for stroke in September 2021.  She is accompanied by her son and daughter and history is obtained from them and review of electronic medical records and personally reviewed pertinent available imaging films in PACS.  Ms. Rohner has no known past medical history as she has not seen a medical provider for 25 years.  She was brought in by family to [facility] and seen by neurology on 05/15/2020 with declining mental and physical status for several months with failure to thrive.  Initial MRI scan of the brain was suboptimal due to motion degradation but did show what appeared to be ill-defined right parietal periventricular weak diffusion hyperintensity likely subacute infarct.  Subsequent MRI was repeated which did confirm a right coronary radiata and left temporal white matter lacunar strokes.  MRI also showed multiple bilateral old lacunar strokes as well as extensive changes of small vessel disease as well as chronic microhemorrhages.  MRA was pretty much unremarkable except for left P1 stenosis..  2D echo showed normal ejection fraction without cardiac source of embolism.  Carotid ultrasound showed no significant extracranial stenosis.  LDL cholesterol was elevated at 99 mg percent and hemoglobin A1c was 6.6.  Vitamin B12 was quite low at 133.  In urinalysis suggested infection with greater than 100,000 colonies of E. coli.  Patient was started on B12 replacement as well as treated with antibiotics for UTI.  She is started on aspirin and Plavix for 3 weeks followed by aspirin alone.  She is currently living at home with family.  She still requires constant 24-hour supervision and  care.  She is getting home physical occupational therapy and does walk a little bit with the therapist but needs close supervision and 1% support given with a walker.  She has not shown any cognitive improvement and has infection significant emotional lability and does cry inappropriately.  She speaks in short sentences only when spoken to and does not have much spontaneous speech.  She is has trouble swallowing and oral medications have to be crushed.  She is incontinent of urine but is able to tell when she needs to have a bowel movement.  Family has seen primary care physician who repeated lab work on October 29 and apparently they were satisfactory but I do not have the reports.  She has a scheduled follow-up appointment on January 28 with primary physician as well.  Patient has no known prior history of strokes but she had not sought any medical care for 25+ years.  She does not have any family history of Alzheimer's seizures or significant neurological problems.  She is currently living with her son and daughter at home and has 24-hour supervision and care ROS:   14 system review of systems is positive for memory loss, cognitive worsening, gait difficulty, difficulty swallowing, urinary incontinence, all other systems negative PMH:  Past Medical History:  Diagnosis Date  . Hypertension   . Stroke [facility]) 05/13/2020    Social History:  Social History   Socioeconomic History  . Marital status: Married    Spouse name: Not on file  . Number of children: Not on file  . Years of education: Not on file  .  Highest education level: Not on file  Occupational History  . Not on file  Tobacco Use  . Smoking status: Former Smoker    Types: Cigarettes    Quit date: 05/13/2020    Years since quitting: 0.3  . Smokeless tobacco: Never Used  Vaping Use  . Vaping Use: Never used  Substance and Sexual Activity  . Alcohol use: Never  . Drug use: Never  . Sexual activity: Not on file  Other Topics  Concern  . Not on file  Social History Narrative   Lives with husband, son and daughter in law   Right Handed   Drinks no caffeine   Social Determinants of Health   Financial Resource Strain: Not on file  Food Insecurity: Not on file  Transportation Needs: Not on file  Physical Activity: Not on file  Stress: Not on file  Social Connections: Not on file  Intimate Partner Violence: Not on file    Medications:   Current Outpatient Medications on File Prior to Visit  Medication Sig Dispense Refill  . amLODipine (NORVASC) 5 MG tablet Take 1 tablet (5 mg total) by mouth daily. 30 tablet 11  . aspirin EC 81 MG EC tablet Take 1 tablet (81 mg total) by mouth daily. Swallow whole. 30 tablet 11  . atorvastatin (LIPITOR) 40 MG tablet Take 1 tablet (40 mg total) by mouth daily.    . folic acid (FOLVITE) 1 MG tablet Take 1 tablet (1 mg total) by mouth daily.    . Multiple Vitamin (MULTIVITAMIN WITH MINERALS) TABS tablet Take 1 tablet by mouth daily.    Marland Kitchen senna-docusate (SENOKOT-S) 8.6-50 MG tablet Take 2 tablets by mouth at bedtime as needed for mild constipation.    . thiamine 100 MG tablet Take 1 tablet (100 mg total) by mouth daily. 30 tablet 0  . vitamin B-12 1000 MCG tablet Take 1 tablet (1,000 mcg total) by mouth daily.    . nicotine (NICODERM CQ - DOSED IN MG/24 HOURS) 14 mg/24hr patch Place 1 patch (14 mg total) onto the skin daily as needed (withdrawal from nicotine). 28 patch 0   No current facility-administered medications on file prior to visit.    Allergies:  No Known Allergies  Physical Exam General: Frail malnourished looking elderly Caucasian lady seated, in no evident distress Head: head normocephalic and atraumatic.  Neck: supple with no carotid or supraclavicular bruits Cardiovascular: regular rate and rhythm, no murmurs Musculoskeletal: no deformity Skin:  no rash/petichiae Vascular:  Normal pulses all extremities Vitals:   09/23/20 0850  BP: (!) 180/82  Pulse: 88    Neurologic Exam Mental Status: Awake and fully alert. Disoriented to place and time.  Speech is slow and hesitant and slightly nonfluent but no dysarthria.  Does recognize her son and daughter by name.  Recent and remote memory poor attention span, concentration and fund of knowledge poor. Mood and affect labile and burst into tears without obvious provocation.  Unable to complete Mini-Mental status exam Cranial Nerves: Fundoscopic exam reveals sharp disc margins. Pupils equal, briskly reactive to light. Extraocular movements full without nystagmus. Visual fields full to confrontation. Hearing intact. Facial sensation intact. Face, tongue, palate moves normally and symmetrically.  Motor: Mild weakness of left grip and intrinsic hand muscles.  Orbits right over left upper extremity.  Fine finger movements are diminished on the left..  Mild left hip flexor weakness.  Able to move all 4 extremities against gravity but not very cooperative for detailed muscle testing.  Sensory.:  intact to touch ,pinprick .position and vibratory sensation.  Coordination: Rapid alternating movements normal in all extremities. Finger-to-nose and heel-to-shin performed accurately bilaterally. Gait and Station: Gait deferred as she did not bring her walker and is full assist. Reflexes: 1+ and symmetric. Toes downgoing.     Modified Rankin  4   ASSESSMENT: 75 year old Caucasian lady with bilateral subcortical lacunar infarcts in September 2021 with preceding history of 6 months of gradual cognitive decline likely due to combination of Alzheimer's with mixed vascular dementia.  She was also found to have low vitamin B12 and urinary tract infections but both have been treated and her dementia persists.     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  She was also found to have vitamin B12 deficiency and urinary tract infection with treatment of both conditions has not necessarily led to any cognitive improvement hence I recommend treatment trial of Namenda starter pack and if tolerated 10 mg twice daily.  Check EEG for seizures.  Continue aspirin for stroke prevention with strict control of hypertension with blood pressure goal below 130/90, lipids with LDL cholesterol goal below 70 mg percent and diabetes with hemoglobin A1c goal below 6.5%.  Continue ongoing home physical and occupational therapy and she will need 24-hour supervision at home.  She will return for follow-up in 3 months or call earlier if necessary. Greater than 50% of time during this prolonged 40 minute visit was spent on counseling,explanation of diagnosis, planning of further management, discussion with patient and family and coordination of care Antony Contras, MD Note: This document was prepared with digital dictation and possible smart phrase technology. Any transcriptional errors that result from this process are unintentional

## 2020-09-29 ENCOUNTER — Inpatient Hospital Stay (HOSPITAL_COMMUNITY)
Admission: EM | Admit: 2020-09-29 | Discharge: 2020-10-04 | DRG: 682 | Disposition: A | Discharge status: Home Health | Payer: Medicare Other | Attending: Internal Medicine | Admitting: Internal Medicine

## 2020-09-29 ENCOUNTER — Encounter (HOSPITAL_COMMUNITY): Payer: Self-pay | Admitting: Emergency Medicine

## 2020-09-29 ENCOUNTER — Other Ambulatory Visit: Payer: Self-pay

## 2020-09-29 DIAGNOSIS — I69311 Memory deficit following cerebral infarction: Secondary | ICD-10-CM

## 2020-09-29 DIAGNOSIS — Z7982 Long term (current) use of aspirin: Secondary | ICD-10-CM

## 2020-09-29 DIAGNOSIS — Z87891 Personal history of nicotine dependence: Secondary | ICD-10-CM

## 2020-09-29 DIAGNOSIS — E538 Deficiency of other specified B group vitamins: Secondary | ICD-10-CM | POA: Diagnosis present

## 2020-09-29 DIAGNOSIS — F603 Borderline personality disorder: Secondary | ICD-10-CM | POA: Diagnosis present

## 2020-09-29 DIAGNOSIS — D649 Anemia, unspecified: Secondary | ICD-10-CM | POA: Diagnosis present

## 2020-09-29 DIAGNOSIS — F0390 Unspecified dementia without behavioral disturbance: Secondary | ICD-10-CM | POA: Diagnosis present

## 2020-09-29 DIAGNOSIS — E785 Hyperlipidemia, unspecified: Secondary | ICD-10-CM | POA: Diagnosis present

## 2020-09-29 DIAGNOSIS — F0150 Vascular dementia without behavioral disturbance: Secondary | ICD-10-CM | POA: Diagnosis present

## 2020-09-29 DIAGNOSIS — U071 COVID-19: Secondary | ICD-10-CM | POA: Diagnosis present

## 2020-09-29 DIAGNOSIS — N179 Acute kidney failure, unspecified: Secondary | ICD-10-CM | POA: Diagnosis not present

## 2020-09-29 DIAGNOSIS — N390 Urinary tract infection, site not specified: Secondary | ICD-10-CM | POA: Diagnosis present

## 2020-09-29 DIAGNOSIS — I69398 Other sequelae of cerebral infarction: Secondary | ICD-10-CM

## 2020-09-29 DIAGNOSIS — J1282 Pneumonia due to coronavirus disease 2019: Secondary | ICD-10-CM | POA: Diagnosis present

## 2020-09-29 DIAGNOSIS — Z283 Underimmunization status: Secondary | ICD-10-CM

## 2020-09-29 DIAGNOSIS — F0280 Dementia in other diseases classified elsewhere without behavioral disturbance: Secondary | ICD-10-CM | POA: Diagnosis present

## 2020-09-29 DIAGNOSIS — I119 Hypertensive heart disease without heart failure: Secondary | ICD-10-CM | POA: Diagnosis present

## 2020-09-29 DIAGNOSIS — Z79899 Other long term (current) drug therapy: Secondary | ICD-10-CM

## 2020-09-29 DIAGNOSIS — R4182 Altered mental status, unspecified: Secondary | ICD-10-CM | POA: Diagnosis present

## 2020-09-29 LAB — RESP PANEL BY RT-PCR (FLU A&B, COVID) ARPGX2
Influenza A by PCR: NEGATIVE
Influenza B by PCR: NEGATIVE
SARS Coronavirus 2 by RT PCR: POSITIVE — AB

## 2020-09-29 LAB — CBC
HCT: 37 % (ref 36.0–46.0)
Hemoglobin: 11.6 g/dL — ABNORMAL LOW (ref 12.0–15.0)
MCH: 30.4 pg (ref 26.0–34.0)
MCHC: 31.4 g/dL (ref 30.0–36.0)
MCV: 97.1 fL (ref 80.0–100.0)
Platelets: 226 10*3/uL (ref 150–400)
RBC: 3.81 MIL/uL — ABNORMAL LOW (ref 3.87–5.11)
RDW: 12.7 % (ref 11.5–15.5)
WBC: 6.8 10*3/uL (ref 4.0–10.5)
nRBC: 0 % (ref 0.0–0.2)

## 2020-09-29 LAB — BASIC METABOLIC PANEL
Anion gap: 11 (ref 5–15)
BUN: 43 mg/dL — ABNORMAL HIGH (ref 8–23)
CO2: 22 mmol/L (ref 22–32)
Calcium: 8.4 mg/dL — ABNORMAL LOW (ref 8.9–10.3)
Chloride: 104 mmol/L (ref 98–111)
Creatinine, Ser: 2.63 mg/dL — ABNORMAL HIGH (ref 0.44–1.00)
GFR, Estimated: 18 mL/min — ABNORMAL LOW (ref >60)
Glucose, Bld: 110 mg/dL — ABNORMAL HIGH (ref 70–99)
Potassium: 4.2 mmol/L (ref 3.5–5.1)
Sodium: 137 mmol/L (ref 135–145)

## 2020-09-29 NOTE — ED Notes (Signed)
Lattie Haw, daughter in law, [phone] would like a call when pt is in room

## 2020-09-29 NOTE — ED Triage Notes (Signed)
Pt to triage via [facility] from home.  Family reports generalized weakness and AMS x 2 days.  Pt alert and oriented to name.  CBG 194.  18g LAC.  Family member has COVID symptoms.  Denies pain.

## 2020-09-29 NOTE — ED Notes (Signed)
Patient moved to appropriate waiting area

## 2020-09-30 ENCOUNTER — Emergency Department (HOSPITAL_COMMUNITY): Payer: Medicare Other

## 2020-09-30 ENCOUNTER — Encounter (HOSPITAL_COMMUNITY): Payer: Self-pay | Admitting: Internal Medicine

## 2020-09-30 DIAGNOSIS — U071 COVID-19: Secondary | ICD-10-CM

## 2020-09-30 DIAGNOSIS — J1282 Pneumonia due to coronavirus disease 2019: Secondary | ICD-10-CM | POA: Diagnosis present

## 2020-09-30 DIAGNOSIS — D649 Anemia, unspecified: Secondary | ICD-10-CM | POA: Diagnosis present

## 2020-09-30 DIAGNOSIS — F0150 Vascular dementia without behavioral disturbance: Secondary | ICD-10-CM

## 2020-09-30 DIAGNOSIS — N179 Acute kidney failure, unspecified: Principal | ICD-10-CM

## 2020-09-30 DIAGNOSIS — I69311 Memory deficit following cerebral infarction: Secondary | ICD-10-CM | POA: Diagnosis not present

## 2020-09-30 DIAGNOSIS — I69398 Other sequelae of cerebral infarction: Secondary | ICD-10-CM | POA: Diagnosis not present

## 2020-09-30 DIAGNOSIS — G309 Alzheimer's disease, unspecified: Secondary | ICD-10-CM | POA: Diagnosis not present

## 2020-09-30 DIAGNOSIS — N390 Urinary tract infection, site not specified: Secondary | ICD-10-CM | POA: Diagnosis present

## 2020-09-30 DIAGNOSIS — R4182 Altered mental status, unspecified: Secondary | ICD-10-CM | POA: Diagnosis present

## 2020-09-30 DIAGNOSIS — E785 Hyperlipidemia, unspecified: Secondary | ICD-10-CM

## 2020-09-30 DIAGNOSIS — I119 Hypertensive heart disease without heart failure: Secondary | ICD-10-CM | POA: Diagnosis present

## 2020-09-30 DIAGNOSIS — E538 Deficiency of other specified B group vitamins: Secondary | ICD-10-CM | POA: Diagnosis present

## 2020-09-30 DIAGNOSIS — F0280 Dementia in other diseases classified elsewhere without behavioral disturbance: Secondary | ICD-10-CM

## 2020-09-30 DIAGNOSIS — Z87891 Personal history of nicotine dependence: Secondary | ICD-10-CM | POA: Diagnosis not present

## 2020-09-30 DIAGNOSIS — Z7982 Long term (current) use of aspirin: Secondary | ICD-10-CM | POA: Diagnosis not present

## 2020-09-30 DIAGNOSIS — Z283 Underimmunization status: Secondary | ICD-10-CM | POA: Diagnosis not present

## 2020-09-30 DIAGNOSIS — F603 Borderline personality disorder: Secondary | ICD-10-CM | POA: Diagnosis present

## 2020-09-30 DIAGNOSIS — F0390 Unspecified dementia without behavioral disturbance: Secondary | ICD-10-CM | POA: Diagnosis present

## 2020-09-30 DIAGNOSIS — Z79899 Other long term (current) drug therapy: Secondary | ICD-10-CM | POA: Diagnosis not present

## 2020-09-30 LAB — HEPATIC FUNCTION PANEL
ALT: 19 U/L (ref 0–44)
AST: 34 U/L (ref 15–41)
Albumin: 3.3 g/dL — ABNORMAL LOW (ref 3.5–5.0)
Alkaline Phosphatase: 70 U/L (ref 38–126)
Bilirubin, Direct: <0.1 mg/dL (ref 0.0–0.2)
Total Bilirubin: 0.3 mg/dL (ref 0.3–1.2)
Total Protein: 6.3 g/dL — ABNORMAL LOW (ref 6.5–8.1)

## 2020-09-30 LAB — TYPE AND SCREEN
ABO/RH(D): A POS
Antibody Screen: NEGATIVE

## 2020-09-30 LAB — URINALYSIS, ROUTINE W REFLEX MICROSCOPIC
Bilirubin Urine: NEGATIVE
Glucose, UA: NEGATIVE mg/dL
Ketones, ur: NEGATIVE mg/dL
Nitrite: NEGATIVE
Protein, ur: 30 mg/dL — AB
Specific Gravity, Urine: 1.01 (ref 1.005–1.030)
pH: 6 (ref 5.0–8.0)

## 2020-09-30 LAB — TROPONIN I (HIGH SENSITIVITY)
Troponin I (High Sensitivity): 58 ng/L — ABNORMAL HIGH (ref <18)
Troponin I (High Sensitivity): 57 ng/L — ABNORMAL HIGH (ref <18)

## 2020-09-30 LAB — ABO/RH: ABO/RH(D): A POS

## 2020-09-30 LAB — FERRITIN: Ferritin: 123 ng/mL (ref 11–307)

## 2020-09-30 LAB — FIBRINOGEN: Fibrinogen: 420 mg/dL (ref 210–475)

## 2020-09-30 LAB — LACTATE DEHYDROGENASE: LDH: 218 U/L — ABNORMAL HIGH (ref 98–192)

## 2020-09-30 LAB — BRAIN NATRIURETIC PEPTIDE: B Natriuretic Peptide: 160.1 pg/mL — ABNORMAL HIGH (ref 0.0–100.0)

## 2020-09-30 LAB — C-REACTIVE PROTEIN: CRP: 0.6 mg/dL (ref <1.0)

## 2020-09-30 LAB — D-DIMER, QUANTITATIVE: D-Dimer, Quant: 1.98 ug/mL-FEU — ABNORMAL HIGH (ref 0.00–0.50)

## 2020-09-30 LAB — HEPATITIS B SURFACE ANTIGEN: Hepatitis B Surface Ag: NONREACTIVE

## 2020-09-30 LAB — PROCALCITONIN: Procalcitonin: 0.1 ng/mL

## 2020-09-30 IMAGING — DX DG CHEST 1V PORT
1 series · 1 of 1 positions shown · non-contrast
Comparison: [DATE]

CLINICAL DATA: Cough

EXAM:
PORTABLE CHEST 1 VIEW

[chest ap]
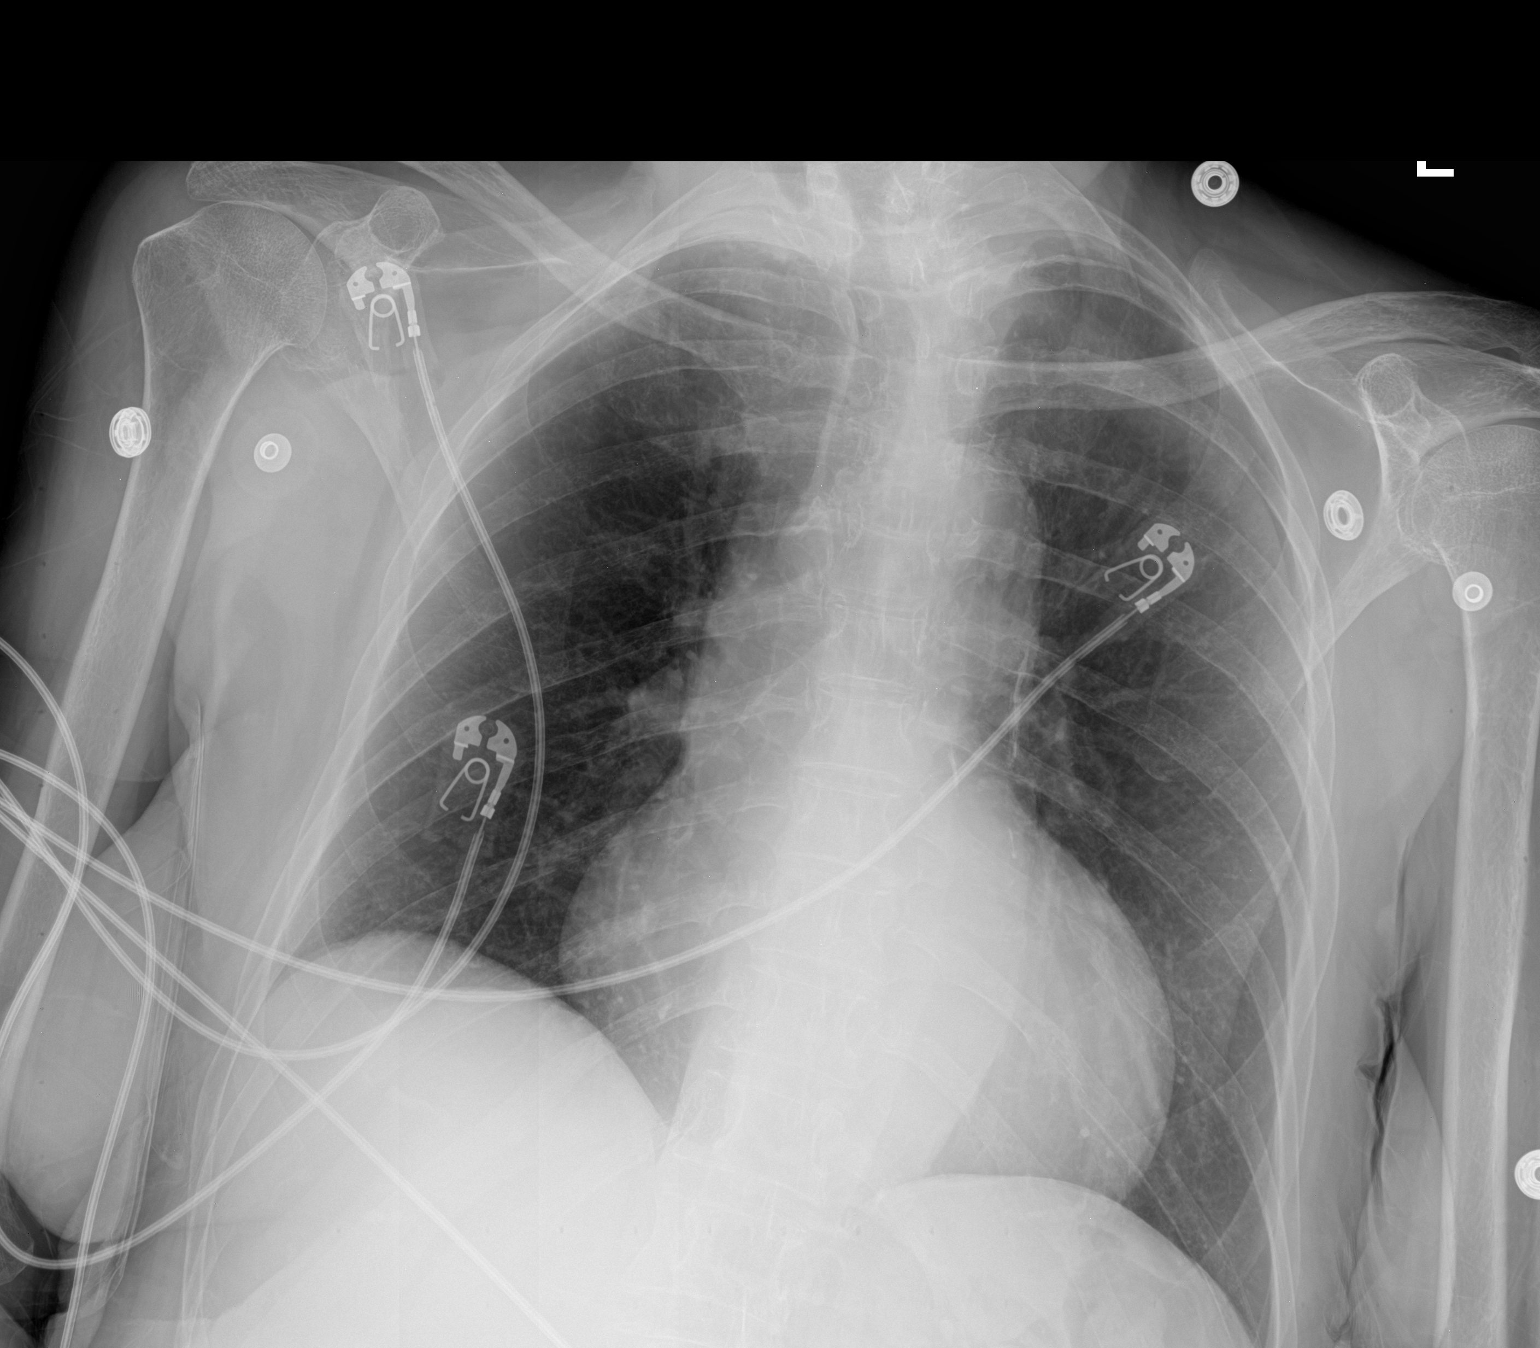

[1 of 1 positions shown; findings below may reference images not displayed]

FINDINGS: The lungs are clear. Heart is borderline enlarged with pulmonary
vascularity normal. No adenopathy. There is aortic atherosclerosis.
No bone lesions
IMPRESSION: Borderline cardiac enlargement.  Lungs clear.

Aortic Atherosclerosis ([OB]-[OB]).

## 2020-09-30 MED ORDER — GUAIFENESIN-DM 100-10 MG/5ML PO SYRP: 10.0000 mL | ORAL_SOLUTION | ORAL | Status: DC | PRN

## 2020-09-30 MED ORDER — SODIUM CHLORIDE 0.9 % IV BOLUS
500.0000 mL | Freq: Once | INTRAVENOUS | Status: AC
  Administered 2020-09-30: 500 mL via INTRAVENOUS

## 2020-09-30 MED ORDER — ONDANSETRON HCL 4 MG PO TABS: 4.0000 mg | ORAL_TABLET | Freq: Four times a day (QID) | ORAL | Status: DC | PRN

## 2020-09-30 MED ORDER — MEMANTINE HCL 10 MG PO TABS
5.0000 mg | ORAL_TABLET | Freq: Two times a day (BID) | ORAL | Status: DC
  Administered 2020-10-01 – 2020-10-04 (×7): 5 mg via ORAL
  Filled 2020-09-30 (×8): qty 1

## 2020-09-30 MED ORDER — ENSURE ENLIVE PO LIQD
237.0000 mL | Freq: Two times a day (BID) | ORAL | Status: DC
  Administered 2020-10-01 – 2020-10-04 (×8): 237 mL via ORAL
  Filled 2020-09-30 (×2): qty 237

## 2020-09-30 MED ORDER — ATORVASTATIN CALCIUM 40 MG PO TABS
40.0000 mg | ORAL_TABLET | Freq: Every day | ORAL | Status: DC
  Administered 2020-09-30 – 2020-10-04 (×5): 40 mg via ORAL
  Filled 2020-09-30 (×3): qty 1
  Filled 2020-09-30 (×2): qty 4

## 2020-09-30 MED ORDER — IPRATROPIUM-ALBUTEROL 20-100 MCG/ACT IN AERS
1.0000 | INHALATION_SPRAY | Freq: Four times a day (QID) | RESPIRATORY_TRACT | Status: DC | PRN
  Filled 2020-09-30: qty 4

## 2020-09-30 MED ORDER — MEMANTINE HCL 10 MG PO TABS
5.0000 mg | ORAL_TABLET | Freq: Once | ORAL | Status: AC
  Administered 2020-09-30: 5 mg via ORAL
  Filled 2020-09-30: qty 1

## 2020-09-30 MED ORDER — ZINC SULFATE 220 (50 ZN) MG PO CAPS
220.0000 mg | ORAL_CAPSULE | Freq: Every day | ORAL | Status: DC
  Administered 2020-09-30 – 2020-10-04 (×5): 220 mg via ORAL
  Filled 2020-09-30 (×5): qty 1

## 2020-09-30 MED ORDER — MEMANTINE HCL 28 X 5 MG & 21 X 10 MG PO TABS: ORAL_TABLET | ORAL | Status: DC

## 2020-09-30 MED ORDER — ASPIRIN EC 81 MG PO TBEC
81.0000 mg | DELAYED_RELEASE_TABLET | Freq: Every day | ORAL | Status: DC
  Administered 2020-09-30 – 2020-10-04 (×5): 81 mg via ORAL
  Filled 2020-09-30 (×5): qty 1

## 2020-09-30 MED ORDER — HYDRALAZINE HCL 20 MG/ML IJ SOLN: 10.0000 mg | INTRAMUSCULAR | Status: DC | PRN

## 2020-09-30 MED ORDER — SODIUM CHLORIDE 0.9 % IV SOLN: INTRAVENOUS | Status: DC

## 2020-09-30 MED ORDER — ACETAMINOPHEN 325 MG PO TABS
650.0000 mg | ORAL_TABLET | Freq: Four times a day (QID) | ORAL | Status: DC | PRN
  Administered 2020-10-01 – 2020-10-03 (×3): 650 mg via ORAL
  Filled 2020-09-30 (×3): qty 2

## 2020-09-30 MED ORDER — SODIUM CHLORIDE 0.9 % IV SOLN: Freq: Once | INTRAVENOUS | Status: AC

## 2020-09-30 MED ORDER — ASCORBIC ACID 500 MG PO TABS
500.0000 mg | ORAL_TABLET | Freq: Every day | ORAL | Status: DC
  Administered 2020-09-30 – 2020-10-04 (×5): 500 mg via ORAL
  Filled 2020-09-30 (×5): qty 1

## 2020-09-30 MED ORDER — SENNOSIDES-DOCUSATE SODIUM 8.6-50 MG PO TABS
1.0000 | ORAL_TABLET | Freq: Every day | ORAL | Status: DC
  Administered 2020-09-30 – 2020-10-03 (×4): 1 via ORAL
  Filled 2020-09-30 (×4): qty 1

## 2020-09-30 MED ORDER — HEPARIN SODIUM (PORCINE) 5000 UNIT/ML IJ SOLN
5000.0000 [IU] | Freq: Three times a day (TID) | INTRAMUSCULAR | Status: DC
  Administered 2020-09-30 – 2020-10-04 (×12): 5000 [IU] via SUBCUTANEOUS
  Filled 2020-09-30 (×12): qty 1

## 2020-09-30 MED ORDER — SODIUM CHLORIDE 0.9% FLUSH
3.0000 mL | Freq: Two times a day (BID) | INTRAVENOUS | Status: DC
  Administered 2020-09-30 – 2020-10-03 (×7): 3 mL via INTRAVENOUS

## 2020-09-30 MED ORDER — ONDANSETRON HCL 4 MG/2ML IJ SOLN: 4.0000 mg | Freq: Four times a day (QID) | INTRAMUSCULAR | Status: DC | PRN

## 2020-09-30 MED ORDER — AMLODIPINE BESYLATE 5 MG PO TABS
5.0000 mg | ORAL_TABLET | Freq: Every day | ORAL | Status: DC
  Administered 2020-09-30 – 2020-10-04 (×5): 5 mg via ORAL
  Filled 2020-09-30 (×5): qty 1

## 2020-09-30 NOTE — ED Notes (Signed)
Hospital Bed Ordered @ 1701-per Alana, RN called by Enmanuel Zufall  

## 2020-09-30 NOTE — ED Provider Notes (Signed)
[facility] EMERGENCY DEPARTMENT Provider Note   CSN: [id number] Arrival date & time: 09/29/20  1700     History Chief Complaint  Patient presents with  . Weakness    Melinda Olson is a 75 y.o. female.  HPI   Pt is a 75 y/o female with a h/o HTN, CVA, dementia, who presents to the ED for eval of  generalized weakness.  9:30 AM discussed case with Denman George, son. He states that patient has had fevers at home and her blood pressure has been high. He states she has had increased fatigue and generally weak. She does not usually walk at baseline but he became concerned because she could not stand at all. Pt has had a cough for the last few days. He states his wife just tested positive for COVID and she lives with the patient. He denies that the patient has had any NV and had been eating/drinking normally up until yesterday. He further states the patient has a h/o dementia seems to be at her baseline mental status.   Past Medical History:  Diagnosis Date  . Hypertension   . Stroke [facility]) 05/13/2020    Patient Active Problem List   Diagnosis Date Noted  . ARF (acute renal failure) ([place]) 09/30/2020  . Mixed dementia ([place]) 09/23/2020  . B12 deficiency 09/23/2020  . Acute CVA (cerebrovascular accident) ([place]) 05/15/2020  . AMS (altered mental status) 05/14/2020  . UTI (urinary tract infection) 05/14/2020  . Leucocytosis 05/14/2020  . AKI (acute kidney injury) ([place]) 05/14/2020    History reviewed. No pertinent surgical history.   OB History   No obstetric history on file.     No family history on file.  Social History   Tobacco Use  . Smoking status: Former Smoker    Types: Cigarettes    Quit date: 05/13/2020    Years since quitting: 0.3  . Smokeless tobacco: Never Used  Vaping Use  . Vaping Use: Never used  Substance Use Topics  . Alcohol use: Never  . Drug use: Never    Home Medications Prior to Admission medications   Medication Sig Start  Date End Date Taking? Authorizing Provider  amLODipine (NORVASC) 5 MG tablet Take 1 tablet (5 mg total) by mouth daily. 05/18/20 05/18/21 Yes Amin, Jeanella Flattery, MD  aspirin EC 81 MG EC tablet Take 1 tablet (81 mg total) by mouth daily. Swallow whole. 05/18/20  Yes Amin, Ankit Chirag, MD  atorvastatin (LIPITOR) 40 MG tablet Take 1 tablet (40 mg total) by mouth daily. 05/18/20  Yes Amin, Jeanella Flattery, MD  folic acid (FOLVITE) 1 MG tablet Take 1 tablet (1 mg total) by mouth daily. 05/18/20  Yes Amin, Jeanella Flattery, MD  memantine [facility] TITRATION PAK) tablet pack 5 mg/day for =1 week; 5 mg twice daily for =1 week; 15 mg/day given in 5 mg and 10 mg separated doses for =1 week; then 10 mg twice daily 09/23/20  Yes Garvin Fila, MD  Multiple Vitamin (MULTIVITAMIN WITH MINERALS) TABS tablet Take 1 tablet by mouth daily. 05/18/20  Yes Amin, Ankit Chirag, MD  senna-docusate (SENOKOT-S) 8.6-50 MG tablet Take 2 tablets by mouth at bedtime as needed for mild constipation. Patient taking differently: Take 1 tablet by mouth at bedtime. 05/18/20  Yes Amin, Jeanella Flattery, MD  thiamine 100 MG tablet Take 1 tablet (100 mg total) by mouth daily. 05/18/20  Yes Amin, Jeanella Flattery, MD  vitamin B-12 1000 MCG tablet Take 1 tablet (1,000 mcg total)  by mouth daily. 05/23/20  Yes Amin, Ankit Chirag, MD  memantine (NAMENDA) 10 MG tablet Take 1 tablet (10 mg total) by mouth 2 (two) times daily. Patient not taking: No sig reported 09/23/20   Garvin Fila, MD    Allergies    Patient has no known allergies.  Review of Systems   Review of Systems  Unable to perform ROS: Dementia    Physical Exam Updated Vital Signs BP (!) 186/93   Pulse 80   Temp 98.2 F (36.8 C) (Oral)   Resp 16   SpO2 97%   Physical Exam Vitals and nursing note reviewed.  Constitutional:      General: She is not in acute distress.    Appearance: She is well-developed and well-nourished.  HENT:     Head: Normocephalic and atraumatic.     Mouth/Throat:      Mouth: Mucous membranes are dry.  Eyes:     Conjunctiva/sclera: Conjunctivae normal.  Cardiovascular:     Rate and Rhythm: Normal rate and regular rhythm.     Heart sounds: Normal heart sounds. No murmur heard.   Pulmonary:     Effort: Pulmonary effort is normal. No respiratory distress.     Breath sounds: Rales present. No wheezing or rhonchi.  Abdominal:     General: Bowel sounds are normal.     Palpations: Abdomen is soft.     Tenderness: There is no abdominal tenderness. There is no guarding or rebound.  Musculoskeletal:        General: No edema.     Cervical back: Neck supple.  Skin:    General: Skin is warm and dry.  Neurological:     Mental Status: She is alert.  Psychiatric:        Mood and Affect: Mood and affect normal.     ED Results / Procedures / Treatments   Labs (all labs ordered are listed, but only abnormal results are displayed) Labs Reviewed  RESP PANEL BY RT-PCR (FLU A&B, COVID) ARPGX2 - Abnormal; Notable for the following components:      Result Value   SARS Coronavirus 2 by RT PCR POSITIVE (*)    All other components within normal limits  BASIC METABOLIC PANEL - Abnormal; Notable for the following components:   Glucose, Bld 110 (*)    BUN 43 (*)    Creatinine, Ser 2.63 (*)    Calcium 8.4 (*)    GFR, Estimated 18 (*)    All other components within normal limits  CBC - Abnormal; Notable for the following components:   RBC 3.81 (*)    Hemoglobin 11.6 (*)    All other components within normal limits  URINALYSIS, ROUTINE W REFLEX MICROSCOPIC  CBG MONITORING, ED    EKG EKG Interpretation  Date/Time:  Sunday September 29 2020 17:05:33 EST Ventricular Rate:  101 PR Interval:  144 QRS Duration: 68 QT Interval:  350 QTC Calculation: 453 R Axis:   -38 Text Interpretation: Sinus tachycardia Left axis deviation Cannot rule out Anterior infarct , age undetermined Abnormal ECG Confirmed by Addison Lank [phone]) on 09/29/2020 11:06:15  PM   Radiology DG Chest Portable 1 View  Result Date: 09/30/2020 CLINICAL DATA:  Cough EXAM: PORTABLE CHEST 1 VIEW COMPARISON:  May 14, 2020 FINDINGS: The lungs are clear. Heart is borderline enlarged with pulmonary vascularity normal. No adenopathy. There is aortic atherosclerosis. No bone lesions IMPRESSION: Borderline cardiac enlargement.  Lungs clear. Aortic Atherosclerosis (ICD10-I70.0). Electronically Signed   By: Gwyndolyn Saxon  Jasmine December III M.D.   On: 09/30/2020 10:03    Procedures Procedures (including critical care time)  Medications Ordered in ED Medications  sodium chloride 0.9 % bolus 500 mL (500 mLs Intravenous New Bag/Given 09/30/20 0934)  0.9 %  sodium chloride infusion ( Intravenous New Bag/Given 09/30/20 1610)    ED Course  I have reviewed the triage vital signs and the nursing notes.  Pertinent labs & imaging results that were available during my care of the patient were reviewed by me and considered in my medical decision making (see chart for details).    MDM Rules/Calculators/A&P                          75 y/o F who presents to the ED today for eval of generalized weakness and cough.   Reviewed/interptreted  CBC show some mild anemia but appears baseline BMP with elevated BUN/Cr which is new from prior.  UA pending on admission  COVID test is positive  EKG Sinus tachycardia Left axis deviation Cannot rule out Anterior infarct , age undetermined Abnormal ECG  CXR reviewed/interpreted - Borderline cardiac enlargement. Lungs clear. Aortic Atherosclerosis  Pt with covid, appears dehydrated on exam and has aki. Will admit for hydration and further treatment of her aki  10:29 AM CONSULT with Dr. Tamala Julian who accepts patient for admission   Final Clinical Impression(s) / ED Diagnoses Final diagnoses:  AKI (acute kidney injury) [facility])  COVID    Rx / [place] Orders ED Discharge Orders    None       Bishop Dublin 09/30/20 1047    Luna Fuse,  MD 09/30/20 1101

## 2020-09-30 NOTE — ED Notes (Signed)
Patient daughter in law would like a callback with an update if possible, Lattie Haw [phone]

## 2020-09-30 NOTE — H&P (Signed)
History and Physical    Melinda Olson BWG:[id number] DOB: 07-31-1946 DOA: 09/29/2020  Referring MD/NP/PA: Jaclynn Major, PA-C PCP: Scheryl Marten, PA  Patient coming from: Home via EMS  Chief Complaint: Altered mental status  I have personally briefly reviewed patient's old medical records in [place]   HPI: Melinda Olson is a 75 y.o. female with medical history significant of HTN, CVA with residual deficit presents from home after being found to be altered.  History is obtained from the patient's daughter-in-law over the phone due to the patient's history of memory issues.  Over the last 2 days the patient had been sleeping a lot more than usual.  She is seen very warm to touch yesterday with temperatures up to 101 F at home.  Patient has been trying to suck her food on the plate and did not use a spoon like normal.  When family checked her blood pressure it was reported to be really high and so they called EMS.  She had not had any significant cough, nausea, vomiting, or diarrhea symptoms.  The daughter-in-law had recent COVID exposure and was waiting on her results, but they came back positive today.  Patient's appetite had been okay, but they were having a difficult time getting her to drink fluids.  They did not note any new focal deficits and she favors her right side following the previous stroke she had in August of 2021.  Since that time she will intermittently have mood swings where 1 minute she is laughing andcrying the next.  Patient also has been incontinent of urine and only speaks in short sentences when spoken to, but does not have much free speech.  She was seen by Dr. Leonie Man of neurology last week for her symptoms and started on Namenda at that time.   ED Course: Upon admission into the emergency department patient was seen to be afebrile, pulse elevated up to 111, respirations up to 28, blood pressure 109/54-188/79, and O2 saturations maintained on room air.  Labs  significant for hemoglobin 11.6, BUN 43, and creatinine 2.63 (baseline 1.1).  Patient was given total 1.5 L of normal saline IV fluids.  TRH called to admit.   Review of Systems  Unable to perform ROS: Dementia  Psychiatric/Behavioral: Positive for memory loss.    Past Medical History:  Diagnosis Date  . Hypertension   . Stroke [facility]) 05/13/2020    History reviewed. No pertinent surgical history.   reports that she quit smoking about 4 months ago. Her smoking use included cigarettes. She has never used smokeless tobacco. She reports that she does not drink alcohol and does not use drugs.  No Known Allergies  No family history on file.  Prior to Admission medications   Medication Sig Start Date End Date Taking? Authorizing Provider  amLODipine (NORVASC) 5 MG tablet Take 1 tablet (5 mg total) by mouth daily. 05/18/20 05/18/21 Yes Amin, Jeanella Flattery, MD  aspirin EC 81 MG EC tablet Take 1 tablet (81 mg total) by mouth daily. Swallow whole. 05/18/20  Yes Amin, Ankit Chirag, MD  atorvastatin (LIPITOR) 40 MG tablet Take 1 tablet (40 mg total) by mouth daily. 05/18/20  Yes Amin, Jeanella Flattery, MD  folic acid (FOLVITE) 1 MG tablet Take 1 tablet (1 mg total) by mouth daily. 05/18/20  Yes Amin, Jeanella Flattery, MD  memantine (NAMENDA TITRATION PAK) tablet pack 5 mg/day for =1 week; 5 mg twice daily for =1 week; 15 mg/day given in 5 mg and 10  mg separated doses for =1 week; then 10 mg twice daily 09/23/20  Yes Garvin Fila, MD  Multiple Vitamin (MULTIVITAMIN WITH MINERALS) TABS tablet Take 1 tablet by mouth daily. 05/18/20  Yes Amin, Ankit Chirag, MD  senna-docusate (SENOKOT-S) 8.6-50 MG tablet Take 2 tablets by mouth at bedtime as needed for mild constipation. Patient taking differently: Take 1 tablet by mouth at bedtime. 05/18/20  Yes Amin, Jeanella Flattery, MD  thiamine 100 MG tablet Take 1 tablet (100 mg total) by mouth daily. 05/18/20  Yes Amin, Jeanella Flattery, MD  vitamin B-12 1000 MCG tablet Take 1 tablet (1,000  mcg total) by mouth daily. 05/23/20  Yes Amin, Ankit Chirag, MD  memantine (NAMENDA) 10 MG tablet Take 1 tablet (10 mg total) by mouth 2 (two) times daily. Patient not taking: No sig reported 09/23/20   Garvin Fila, MD    Physical Exam:  Constitutional: Elderly female who appears to be in no acute distress initially Vitals:   09/30/20 0500 09/30/20 0642 09/30/20 0907 09/30/20 0915  BP: 113/66 (!) 121/57 (!) 188/79 (!) 144/69  Pulse: 89 93 90 88  Resp: 16 18 18 20   Temp:   98.2 F (36.8 C)   TempSrc:   Oral   SpO2: 93% 95% 98% 100%   Eyes: PERRL, lids and conjunctivae normal ENMT: Mucous membranes are moist. Posterior pharynx clear of any exudate or lesions.  Neck: normal, supple, no masses, no thyromegaly Respiratory: clear to auscultation bilaterally, no wheezing, no crackles. Normal respiratory effort. No accessory muscle use.  Cardiovascular: Regular rate and rhythm, no murmurs / rubs / gallops. No extremity edema. 2+ pedal pulses. No carotid bruits.  Abdomen: no tenderness, no masses palpated. No hepatosplenomegaly. Bowel sounds positive.  Musculoskeletal: no clubbing / cyanosis. No joint deformity upper and lower extremities. Good ROM, no contractures. Normal muscle tone.  Skin: no rashes, lesions, ulcers. No induration Neurologic: CN 2-12 grossly intact.  Able to move all ectremities Psychiatric: Labile mood patient randomly started crying hysterically, but was unable to tell me why.  Alert and oriented to self, but not time and place.    Labs on Admission: I have personally reviewed following labs and imaging studies  CBC: Recent Labs  Lab 09/29/20 1725  WBC 6.8  HGB 11.6*  HCT 37.0  MCV 97.1  PLT 885   Basic Metabolic Panel: Recent Labs  Lab 09/29/20 1725  NA 137  K 4.2  CL 104  CO2 22  GLUCOSE 110*  BUN 43*  CREATININE 2.63*  CALCIUM 8.4*   GFR: Estimated Creatinine Clearance: 13.3 mL/min (A) (by C-G formula based on SCr of 2.63 mg/dL (H)). Liver  Function Tests: No results for input(s): AST, ALT, ALKPHOS, BILITOT, PROT, ALBUMIN in the last 168 hours. No results for input(s): LIPASE, AMYLASE in the last 168 hours. No results for input(s): AMMONIA in the last 168 hours. Coagulation Profile: No results for input(s): INR, PROTIME in the last 168 hours. Cardiac Enzymes: No results for input(s): CKTOTAL, CKMB, CKMBINDEX, TROPONINI in the last 168 hours. BNP (last 3 results) No results for input(s): PROBNP in the last 8760 hours. HbA1C: No results for input(s): HGBA1C in the last 72 hours. CBG: No results for input(s): GLUCAP in the last 168 hours. Lipid Profile: No results for input(s): CHOL, HDL, LDLCALC, TRIG, CHOLHDL, LDLDIRECT in the last 72 hours. Thyroid Function Tests: No results for input(s): TSH, T4TOTAL, FREET4, T3FREE, THYROIDAB in the last 72 hours. Anemia Panel: No results for input(s): VITAMINB12,  FOLATE, FERRITIN, TIBC, IRON, RETICCTPCT in the last 72 hours. Urine analysis:    Component Value Date/Time   COLORURINE STRAW (A) 05/14/2020 1800   APPEARANCEUR TURBID (A) 05/14/2020 1800   LABSPEC 1.010 05/14/2020 1800   PHURINE 6.0 05/14/2020 1800   GLUCOSEU NEGATIVE 05/14/2020 1800   HGBUR SMALL (A) 05/14/2020 1800   BILIRUBINUR NEGATIVE 05/14/2020 1800   KETONESUR 20 (A) 05/14/2020 1800   PROTEINUR 100 (A) 05/14/2020 1800   NITRITE NEGATIVE 05/14/2020 1800   LEUKOCYTESUR LARGE (A) 05/14/2020 1800   Sepsis Labs: Recent Results (from the past 240 hour(s))  Resp Panel by RT-PCR (Flu A&B, Covid) Nasopharyngeal Swab     Status: Abnormal   Collection Time: 09/29/20  5:05 PM   Specimen: Nasopharyngeal Swab; Nasopharyngeal(NP) swabs in vial transport medium  Result Value Ref Range Status   SARS Coronavirus 2 by RT PCR POSITIVE (A) NEGATIVE Final    Comment: RESULT CALLED TO, READ BACK BY AND VERIFIED WITH: RN S.MICHELL AT 1846 ON 09/29/2020 BY T.SAAD (NOTE) SARS-CoV-2 target nucleic acids are DETECTED.  The  SARS-CoV-2 RNA is generally detectable in upper respiratory specimens during the acute phase of infection. Positive results are indicative of the presence of the identified virus, but do not rule out bacterial infection or co-infection with other pathogens not detected by the test. Clinical correlation with patient history and other diagnostic information is necessary to determine patient infection status. The expected result is Negative.  Fact Sheet for Patients: EntrepreneurPulse.com.au  Fact Sheet for Healthcare Providers: IncredibleEmployment.be  This test is not yet approved or cleared by the [place] FDA and  has been authorized for detection and/or diagnosis of SARS-CoV-2 by FDA under an Emergency Use Authorization (EUA).  This EUA will remain in effect (meaning this  test can be used) for the duration of  the COVID-19 declaration under Section 564(b)(1) of the Act, 21 U.S.C. section 360bbb-3(b)(1), unless the authorization is terminated or revoked sooner.     Influenza A by PCR NEGATIVE NEGATIVE Final   Influenza B by PCR NEGATIVE NEGATIVE Final    Comment: (NOTE) The Xpert Xpress SARS-CoV-2/FLU/RSV plus assay is intended as an aid in the diagnosis of influenza from Nasopharyngeal swab specimens and should not be used as a sole basis for treatment. Nasal washings and aspirates are unacceptable for Xpert Xpress SARS-CoV-2/FLU/RSV testing.  Fact Sheet for Patients: EntrepreneurPulse.com.au  Fact Sheet for Healthcare Providers: IncredibleEmployment.be  This test is not yet approved or cleared by the [place] FDA and has been authorized for detection and/or diagnosis of SARS-CoV-2 by FDA under an Emergency Use Authorization (EUA). This EUA will remain in effect (meaning this test can be used) for the duration of the COVID-19 declaration under Section 564(b)(1) of the Act, 21 U.S.C. section  360bbb-3(b)(1), unless the authorization is terminated or revoked.  Performed at [facility], [place] [address], [place], [place] [address]      Radiological Exams on Admission: DG Chest Portable 1 View  Result Date: 09/30/2020 CLINICAL DATA:  Cough EXAM: PORTABLE CHEST 1 VIEW COMPARISON:  May 14, 2020 FINDINGS: The lungs are clear. Heart is borderline enlarged with pulmonary vascularity normal. No adenopathy. There is aortic atherosclerosis. No bone lesions IMPRESSION: Borderline cardiac enlargement.  Lungs clear. Aortic Atherosclerosis (ICD10-I70.0). Electronically Signed   By: Lowella Grip III M.D.   On: 09/30/2020 10:03    EKG: Independently reviewed.  Sinus tachycardia 101 bpm with left axis deviation similar to previous except for faster rate  Assessment/Plan Acute renal failure: Patient reportedly had not been drinking fluids recently at home presents with creatinine elevated up to 2.63 with BUN 43.  Baseline creatinine previously noted noted to be 1.1 on 05/18/2020. She had been given 1.5 L of IV fluid in the emergency department. -Admit to medical telemetry -Strict intake and output -Follow-up urinalysis -Gentle normal saline IV fluids at 75 mL/h  -Continue to monitor kidney function  Covid-19 infection: Acute.  Patient incidentally found to be COVID-19 positive and reported to have fevers up to 101 F home prior to arrival.   chest x-ray noted borderline cardiac enlargement with clear lungs.  Patient maintaining O2 saturations on room air. -Airborne precautions  -COVID-19 order set utilized -Check inflammatory markers -Combivent inhaler as needed -Vitamin C and zinc  Mixed dementia: Patient currently only oriented to self.  Had recently been started on Namenda on 09/24/2020 by Dr. Leonie Man.  -Continue Namenda -Continue outpatient follow-up with Dr. Leonie Man  Essential hypertension: On admission blood pressure elevated up to 188/79 -Continue amlodipine 5 mg daily   -Hydralazine IV as needed  History of CVA with residual deficit: Patient noted to have issues with memory, emotional instability, and limited free speech possibly all related with previous stroke in August 2021.  With Dr. Leonie Man of neurology. -Continue aspirin and statin  Normocytic anemia: Hemoglobin 11.6 on admission which appears similar to previous.  -Continue to monitor  Hyperlipidemia -Continue atorvastatin 40 mg daily  DVT prophylaxis: Heparin Code Status: Full Family Communication: Daughter-in-law updated over the phone Disposition Plan: Likely discharge back home once medically stable Consults called: None Admission status: Inpatient, require more than 2 midnight stay given acute renal failure and  Norval Morton MD [facility]   If 7PM-7AM, please contact night-coverage   09/30/2020, 10:29 AM

## 2020-09-30 NOTE — ED Notes (Signed)
Lunch Tray Ordered @ 1109. °

## 2020-09-30 NOTE — ED Notes (Signed)
Patient cleaned up and brief changed. Patient provided paper scrub pants and warm blanket for comfort.

## 2020-09-30 NOTE — ED Notes (Signed)
Provided pt w/dinner tray. I cut her meat up and opened everything up for her.

## 2020-09-30 NOTE — ED Notes (Signed)
Pt provided lunch tray. I cut her meat up and opened everything up for her.

## 2020-10-01 LAB — CBC WITH DIFFERENTIAL/PLATELET
Abs Immature Granulocytes: 0.02 10*3/uL (ref 0.00–0.07)
Basophils Absolute: 0 10*3/uL (ref 0.0–0.1)
Basophils Relative: 1 %
Eosinophils Absolute: 0 10*3/uL (ref 0.0–0.5)
Eosinophils Relative: 0 %
HCT: 34.6 % — ABNORMAL LOW (ref 36.0–46.0)
Hemoglobin: 10.6 g/dL — ABNORMAL LOW (ref 12.0–15.0)
Immature Granulocytes: 0 %
Lymphocytes Relative: 32 %
Lymphs Abs: 2.1 10*3/uL (ref 0.7–4.0)
MCH: 29.9 pg (ref 26.0–34.0)
MCHC: 30.6 g/dL (ref 30.0–36.0)
MCV: 97.7 fL (ref 80.0–100.0)
Monocytes Absolute: 0.6 10*3/uL (ref 0.1–1.0)
Monocytes Relative: 9 %
Neutro Abs: 4 10*3/uL (ref 1.7–7.7)
Neutrophils Relative %: 58 %
Platelets: 204 10*3/uL (ref 150–400)
RBC: 3.54 MIL/uL — ABNORMAL LOW (ref 3.87–5.11)
RDW: 12.5 % (ref 11.5–15.5)
WBC: 6.8 10*3/uL (ref 4.0–10.5)
nRBC: 0 % (ref 0.0–0.2)

## 2020-10-01 LAB — FERRITIN: Ferritin: 148 ng/mL (ref 11–307)

## 2020-10-01 LAB — MAGNESIUM: Magnesium: 2 mg/dL (ref 1.7–2.4)

## 2020-10-01 LAB — COMPREHENSIVE METABOLIC PANEL
ALT: 23 U/L (ref 0–44)
AST: 41 U/L (ref 15–41)
Albumin: 3.1 g/dL — ABNORMAL LOW (ref 3.5–5.0)
Alkaline Phosphatase: 71 U/L (ref 38–126)
Anion gap: 12 (ref 5–15)
BUN: 49 mg/dL — ABNORMAL HIGH (ref 8–23)
CO2: 20 mmol/L — ABNORMAL LOW (ref 22–32)
Calcium: 8.1 mg/dL — ABNORMAL LOW (ref 8.9–10.3)
Chloride: 108 mmol/L (ref 98–111)
Creatinine, Ser: 2.33 mg/dL — ABNORMAL HIGH (ref 0.44–1.00)
GFR, Estimated: 21 mL/min — ABNORMAL LOW (ref >60)
Glucose, Bld: 102 mg/dL — ABNORMAL HIGH (ref 70–99)
Potassium: 4.2 mmol/L (ref 3.5–5.1)
Sodium: 140 mmol/L (ref 135–145)
Total Bilirubin: 0.3 mg/dL (ref 0.3–1.2)
Total Protein: 6 g/dL — ABNORMAL LOW (ref 6.5–8.1)

## 2020-10-01 LAB — C-REACTIVE PROTEIN: CRP: <0.5 mg/dL (ref <1.0)

## 2020-10-01 LAB — PHOSPHORUS: Phosphorus: 4.6 mg/dL (ref 2.5–4.6)

## 2020-10-01 LAB — D-DIMER, QUANTITATIVE: D-Dimer, Quant: 1.78 ug/mL-FEU — ABNORMAL HIGH (ref 0.00–0.50)

## 2020-10-01 MED ORDER — SODIUM CHLORIDE 0.9 % IV SOLN
100.0000 mg | Freq: Every day | INTRAVENOUS | Status: DC
  Filled 2020-10-01 (×2): qty 20

## 2020-10-01 MED ORDER — SODIUM CHLORIDE 0.9 % IV SOLN
100.0000 mg | Freq: Every day | INTRAVENOUS | Status: AC
  Administered 2020-10-01 – 2020-10-04 (×4): 100 mg via INTRAVENOUS
  Filled 2020-10-01 (×3): qty 20

## 2020-10-01 MED ORDER — SODIUM CHLORIDE 0.9 % IV SOLN
100.0000 mg | INTRAVENOUS | Status: AC
  Administered 2020-10-01 (×2): 100 mg via INTRAVENOUS
  Filled 2020-10-01: qty 20

## 2020-10-01 MED ORDER — SODIUM CHLORIDE 0.9 % IV SOLN
200.0000 mg | Freq: Once | INTRAVENOUS | Status: DC
  Filled 2020-10-01: qty 40

## 2020-10-01 MED ORDER — SODIUM CHLORIDE 0.9 % IV SOLN
1.0000 g | INTRAVENOUS | Status: DC
  Administered 2020-10-01 – 2020-10-03 (×3): 1 g via INTRAVENOUS
  Filled 2020-10-01 (×3): qty 10

## 2020-10-01 NOTE — Progress Notes (Addendum)
PROGRESS NOTE                                                                             PROGRESS NOTE                                                                                                                                                                                                             Patient Demographics:    Melinda Olson, is a 75 y.o. female, DOB - September 07, 1946, [id number]  Outpatient Primary MD for the patient is Charlotte Hall, [place], [place]    LOS - 1  Admit date - 09/29/2020    Chief Complaint  Patient presents with  . Weakness       Brief Narrative      HPI: Melinda Olson is a 75 y.o. female with medical history significant of HTN, CVA with residual deficit presents from home after being found to be altered.  History is obtained from the patient's daughter-in-law over the phone due to the patient's history of memory issues.  Over the last 2 days the patient had been sleeping a lot more than usual.  She is seen very warm to touch yesterday with temperatures up to 101 F at home.  Patient has been trying to suck her food on the plate and did not use a spoon like normal.  When family checked her blood pressure it was reported to be really high and so they called EMS.  She had not had any significant cough, nausea, vomiting, or diarrhea symptoms.  The daughter-in-law had recent COVID exposure and was waiting on her results, but they came back positive today.  Patient's appetite had been okay, but they were having a difficult time getting her to drink fluids.  They did not note any new focal deficits and she favors her right side following the previous stroke she had in August of 2021.  Since that time she will intermittently have mood swings where 1 minute she is laughing andcrying the next.  Patient also has been incontinent  of urine and only speaks in short sentences when spoken to, but does not have much free speech.  She was seen by  Dr. Leonie Man of neurology last week for her symptoms and started on Namenda at that time.   ED Course: Upon admission into the emergency department patient was seen to be afebrile, pulse elevated up to 111, respirations up to 28, blood pressure 109/54-188/79, and O2 saturations maintained on room air.  Labs significant for hemoglobin 11.6, BUN 43, and creatinine 2.63 (baseline 1.1).  Patient was given total 1.5 L of normal saline IV fluids.  [facility] called to admit.   Subjective:    Melinda Olson is demented, she is very unreliable historian, she denies any complaints to me currently.      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 -Monitor BMP closely,  -Avoid nephrotoxic medications . -Continue with IV fluids .  Covid-19 infection:  -He is unvaccinated - Acute.  Patient incidentally found to be COVID-19 positive and reported to have fevers up to 101 F home prior to arrival.   chest x-ray noted borderline cardiac enlargement with clear lungs.  Patient maintaining O2 saturations on room air. -Airborne precautions  -COVID-19 order set utilized -Check inflammatory markers -Combivent inhaler as needed -Vitamin C and zinc -Started on remdesivir, no hypoxia, no indication for steroids.  Mixed dementia:  - Patient currently only oriented to self.  Had recently been started on Namenda on 09/24/2020 by Dr. Leonie Man.  -Continue Namenda -Continue outpatient follow-up with Dr. Leonie Man  UTI -started on rocephin , Follow urine cultures  Essential hypertension: -Continue amlodipine 5 mg daily  -Hydralazine IV as needed  History of CVA with residual deficit:   -Patient noted to have issues with memory, emotional instability, and limited free speech possibly all related with previous stroke in August 2021.  With Dr. Leonie Man of neurology. -Continue aspirin and statin  Hyperlipidemia -Continue atorvastatin 40 mg daily  SpO2: 98 %  Recent Labs  Lab 09/29/20 1705 09/29/20 1725 09/30/20 1125 10/01/20 0500  WBC  --  6.8  --  6.8  PLT  --  226  --  204  CRP  --   --  0.6 <0.5  BNP  --   --  160.1*  --   DDIMER  --   --  1.98* 1.78*  PROCALCITON  --   --  0.10  --   AST  --   --  34 41  ALT  --   --  19 23  ALKPHOS  --   --  70 71  BILITOT  --   --  0.3 0.3  ALBUMIN  --   --  3.3* 3.1*  SARSCOV2NAA POSITIVE*  --   --   --        ABG  No results found for: PHART, PCO2ART, PO2ART, HCO3, TCO2, ACIDBASEDEF, O2SAT      Condition - Extremely Guarded  Family Communication  : D/W son by phone  Code Status :  Full  Consults  :  none  Disposition Plan  :    Status is: Inpatient  Remains inpatient  appropriate because:IV treatments appropriate due to intensity of illness or inability to take PO   Dispo: The patient is from: Home              Anticipated d/c is to: Home              Anticipated d/c date is: 2 days              Patient currently is not medically stable to d/c.      DVT Prophylaxis  :   Heparin   Lab Results  Component Value Date   PLT 204 10/01/2020    Diet :  Diet Order            Diet Heart Room service appropriate? Yes; Fluid consistency: Thin  Diet effective now                  Inpatient Medications  Scheduled Meds: . amLODipine  5 mg Oral Daily  . vitamin C  500 mg Oral Daily  . aspirin EC  81 mg Oral Daily  . atorvastatin  40 mg Oral Daily  . feeding supplement  237 mL Oral BID BM  . heparin  5,000 Units Subcutaneous Q8H  . memantine  5 mg Oral BID  . senna-docusate  1 tablet Oral QHS  . sodium chloride flush  3 mL Intravenous Q12H  . zinc sulfate  220 mg Oral Daily    Continuous Infusions: . sodium chloride 75 mL/hr at 09/30/20 1247  . [START ON 10/02/2020] remdesivir 100 mg in NS 100 mL 100 mg (10/01/20 1259)   PRN Meds:.acetaminophen, guaiFENesin-dextromethorphan, hydrALAZINE, Ipratropium-Albuterol, ondansetron **OR** ondansetron (ZOFRAN) IV  Antibiotics  :    Anti-infectives (From admission, onward)   Start     Dose/Rate Route Frequency Ordered Stop   10/02/20 1000  remdesivir 100 mg in sodium chloride 0.9 % 100 mL IVPB  Status:  Discontinued       "Followed by" Linked Group Details   100 mg 200 mL/hr over 30 Minutes Intravenous Daily 10/01/20 0805 10/01/20 1228   10/02/20 1000  remdesivir 100 mg in sodium chloride 0.9 % 100 mL IVPB        100 mg 200 mL/hr over 30 Minutes Intravenous Daily 10/01/20 1230 10/06/20 0959   10/01/20 1330  remdesivir 100 mg in sodium chloride 0.9 % 100 mL IVPB        100 mg 200 mL/hr over 30 Minutes Intravenous Every 1 hr x 2 10/01/20 1230 10/01/20 1423   10/01/20 0930  remdesivir 200 mg in sodium chloride 0.9% 250 mL IVPB  Status:  Discontinued       "Followed by" Linked Group Details   200 mg 580 mL/hr over 30 Minutes Intravenous Once 10/01/20 0805 10/01/20 1228       Jayme Cham M.D on 10/01/2020 at 3:37 PM  To page go to [email or web address]  [facility] -  Office  [phone]      Objective:   Vitals:   10/01/20 0945 10/01/20 1000 10/01/20 1015 10/01/20 1131  BP: 117/64 114/69 120/66 (!) 115/59  Pulse: 71 68 63 77  Resp: '15 14 12 16  '$ Temp:    98.3 F (36.8 C)  TempSrc:    Oral  SpO2: 98% 98% 98% 98%    Wt Readings from Last 3 Encounters:  09/23/20 48.8 kg  05/14/20 79.4 kg    No intake or output data in the 24 hours ending 10/01/20 1537   Physical Exam  Awake, pleasant, confused, poor dentition  Symmetrical Chest wall movement, Good air movement bilaterally, CTAB RRR,No Gallops,Rubs or new Murmurs, No Parasternal Heave +ve B.Sounds, Abd Soft, No tenderness,No rebound -  guarding or rigidity. No Cyanosis, Clubbing or edema, No new Rash or bruise      Data Review:    CBC Recent Labs  Lab 09/29/20 1725 10/01/20 0500  WBC 6.8 6.8  HGB 11.6* 10.6*  HCT 37.0 34.6*  PLT 226 204  MCV 97.1 97.7  MCH 30.4 29.9  MCHC 31.4 30.6  RDW 12.7 12.5  LYMPHSABS  --  2.1  MONOABS  --  0.6  EOSABS  --  0.0  BASOSABS  --  0.0    Recent Labs  Lab 09/29/20 1725 09/30/20 1125 10/01/20 0500  NA 137  --  140  K 4.2  --  4.2  CL 104  --  108  CO2 22  --  20*  GLUCOSE 110*  --  102*  BUN 43*  --  49*  CREATININE 2.63*  --  2.33*  CALCIUM 8.4*  --  8.1*  AST  --  34 41  ALT  --  19 23  ALKPHOS  --  70 71  BILITOT  --  0.3 0.3  ALBUMIN  --  3.3* 3.1*  MG  --   --  2.0  CRP  --  0.6 <0.5  DDIMER  --  1.98* 1.78*  PROCALCITON  --  0.10  --   BNP  --  160.1*  --     ------------------------------------------------------------------------------------------------------------------ No results for input(s): CHOL, HDL, LDLCALC, TRIG, CHOLHDL, LDLDIRECT in the last 72 hours.  Lab Results  Component Value Date   HGBA1C 6.6 (H) 05/15/2020   ------------------------------------------------------------------------------------------------------------------ No results for input(s): TSH, T4TOTAL, T3FREE, THYROIDAB in the last 72 hours.  Invalid input(s): FREET3  Cardiac Enzymes No results for input(s): CKMB, TROPONINI, MYOGLOBIN in the last 168 hours.  Invalid input(s): CK ------------------------------------------------------------------------------------------------------------------    Component Value Date/Time   BNP 160.1 (H) 09/30/2020 1125    Micro Results Recent Results (from the past 240 hour(s))  Resp Panel by RT-PCR (Flu A&B, Covid) Nasopharyngeal Swab     Status: Abnormal   Collection Time: 09/29/20  5:05 PM   Specimen: Nasopharyngeal Swab; Nasopharyngeal(NP) swabs in vial transport medium  Result Value Ref Range Status   SARS Coronavirus 2  by RT PCR POSITIVE (A) NEGATIVE Final    Comment: RESULT CALLED TO, READ BACK BY AND VERIFIED WITH: RN S.MICHELL AT 1846 ON 09/29/2020 BY T.SAAD (NOTE) SARS-CoV-2 target nucleic acids are DETECTED.  The SARS-CoV-2 RNA is generally detectable in upper respiratory specimens during the acute phase of infection. Positive results are indicative of the presence of the identified virus, but do not rule out bacterial infection or co-infection with other pathogens not detected by the test. Clinical correlation with patient history and other diagnostic information is necessary to determine patient infection status. The expected result is Negative.  Fact Sheet for Patients: EntrepreneurPulse.com.au  Fact Sheet for Healthcare Providers: IncredibleEmployment.be  This test is not yet approved or cleared by the [place] FDA and  has been authorized for detection and/or diagnosis of SARS-CoV-2 by FDA under an Emergency Use Authorization (EUA).  This EUA will remain in effect (meaning this  test can be used) for the duration of  the COVID-19 declaration under Section 564(b)(1) of the Act, 21 U.S.C. section 360bbb-3(b)(1), unless the authorization is terminated or revoked sooner.  Influenza A by PCR NEGATIVE NEGATIVE Final   Influenza B by PCR NEGATIVE NEGATIVE Final    Comment: (NOTE) The Xpert Xpress SARS-CoV-2/FLU/RSV plus assay is intended as an aid in the diagnosis of influenza from Nasopharyngeal swab specimens and should not be used as a sole basis for treatment. Nasal washings and aspirates are unacceptable for Xpert Xpress SARS-CoV-2/FLU/RSV testing.  Fact Sheet for Patients: EntrepreneurPulse.com.au  Fact Sheet for Healthcare Providers: IncredibleEmployment.be  This test is not yet approved or cleared by the [place] FDA and has been authorized for detection and/or diagnosis of SARS-CoV-2 by FDA  under an Emergency Use Authorization (EUA). This EUA will remain in effect (meaning this test can be used) for the duration of the COVID-19 declaration under Section 564(b)(1) of the Act, 21 U.S.C. section 360bbb-3(b)(1), unless the authorization is terminated or revoked.  Performed at [facility], [place] [address], [place], [place] [address]     Radiology Reports DG Chest Portable 1 View  Result Date: 09/30/2020 CLINICAL DATA:  Cough EXAM: PORTABLE CHEST 1 VIEW COMPARISON:  May 14, 2020 FINDINGS: The lungs are clear. Heart is borderline enlarged with pulmonary vascularity normal. No adenopathy. There is aortic atherosclerosis. No bone lesions IMPRESSION: Borderline cardiac enlargement.  Lungs clear. Aortic Atherosclerosis (ICD10-I70.0). Electronically Signed   By: Lowella Grip III M.D.   On: 09/30/2020 10:03

## 2020-10-01 NOTE — ED Notes (Signed)
Tele  Breakfast Ordered 

## 2020-10-02 LAB — CBC WITH DIFFERENTIAL/PLATELET
Abs Immature Granulocytes: 0.02 10*3/uL (ref 0.00–0.07)
Basophils Absolute: 0 10*3/uL (ref 0.0–0.1)
Basophils Relative: 1 %
Eosinophils Absolute: 0.1 10*3/uL (ref 0.0–0.5)
Eosinophils Relative: 1 %
HCT: 36.2 % (ref 36.0–46.0)
Hemoglobin: 11.5 g/dL — ABNORMAL LOW (ref 12.0–15.0)
Immature Granulocytes: 0 %
Lymphocytes Relative: 24 %
Lymphs Abs: 1.9 10*3/uL (ref 0.7–4.0)
MCH: 30.1 pg (ref 26.0–34.0)
MCHC: 31.8 g/dL (ref 30.0–36.0)
MCV: 94.8 fL (ref 80.0–100.0)
Monocytes Absolute: 0.5 10*3/uL (ref 0.1–1.0)
Monocytes Relative: 6 %
Neutro Abs: 5.4 10*3/uL (ref 1.7–7.7)
Neutrophils Relative %: 68 %
Platelets: 233 10*3/uL (ref 150–400)
RBC: 3.82 MIL/uL — ABNORMAL LOW (ref 3.87–5.11)
RDW: 12.6 % (ref 11.5–15.5)
WBC: 7.9 10*3/uL (ref 4.0–10.5)
nRBC: 0 % (ref 0.0–0.2)

## 2020-10-02 LAB — COMPREHENSIVE METABOLIC PANEL
ALT: 21 U/L (ref 0–44)
AST: 41 U/L (ref 15–41)
Albumin: 3.1 g/dL — ABNORMAL LOW (ref 3.5–5.0)
Alkaline Phosphatase: 68 U/L (ref 38–126)
Anion gap: 10 (ref 5–15)
BUN: 54 mg/dL — ABNORMAL HIGH (ref 8–23)
CO2: 21 mmol/L — ABNORMAL LOW (ref 22–32)
Calcium: 8.1 mg/dL — ABNORMAL LOW (ref 8.9–10.3)
Chloride: 110 mmol/L (ref 98–111)
Creatinine, Ser: 1.93 mg/dL — ABNORMAL HIGH (ref 0.44–1.00)
GFR, Estimated: 27 mL/min — ABNORMAL LOW (ref >60)
Glucose, Bld: 172 mg/dL — ABNORMAL HIGH (ref 70–99)
Potassium: 4.3 mmol/L (ref 3.5–5.1)
Sodium: 141 mmol/L (ref 135–145)
Total Bilirubin: 0.2 mg/dL — ABNORMAL LOW (ref 0.3–1.2)
Total Protein: 5.9 g/dL — ABNORMAL LOW (ref 6.5–8.1)

## 2020-10-02 LAB — D-DIMER, QUANTITATIVE: D-Dimer, Quant: 1.6 ug/mL-FEU — ABNORMAL HIGH (ref 0.00–0.50)

## 2020-10-02 LAB — C-REACTIVE PROTEIN: CRP: 1.8 mg/dL — ABNORMAL HIGH (ref <1.0)

## 2020-10-02 NOTE — Progress Notes (Signed)
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  -Saturating well. Continue supportive care. No need for steroids. Remdesivir D2.  -CXR- overall clear.   Acute renal failure: -Prerenal in nature.  Baseline creatinine 1.1.  Admission creatinine 2.6.  Improving with IV fluids.  Mixed dementia:  -Continue Namenda.  Follows outpatient Dr. Leonie Man  UTI -Rocephin day 2.  Urine cultures pending  Essential hypertension: -Continue amlodipine 5 mg daily -Hydralazine IV as needed  History of CVA with residual deficit:  -Patient noted to haveissues with memory, emotional instability, and limited free speech possibly all related with previous stroke inAugust 2021.With Dr. Leonie Man of neurology. -Continue aspirin and statin  Hyperlipidemia -Continue atorvastatin 40 mg daily    DVT prophylaxis: heparin injection 5,000 Units Start: 09/30/20 1400  Code Status: Full code Family Communication: Son updated  Status is: Inpatient  Remains inpatient appropriate because:IV treatments appropriate due to intensity of illness or inability to take PO   Dispo: The patient is from: Home              Anticipated d/c is to: Home              Anticipated d/c date is: 1 day              Patient currently is not medically stable to d/c.  Renal function still not at baseline, poor oral intake.  Still needing IV fluids       There is no height or weight on file to calculate BMI.      Subjective: Patient is sitting up in the chair during my evaluation.  Does not have complaints but during the conversation she keeps getting very tearful and wants to go home.  She is unable to carry on a full meaningful  conversation due to her dementia  Review of Systems Otherwise negative except as per HPI, including: General: Denies fever, chills, night sweats or unintended weight loss. Resp: Denies cough, wheezing, shortness of breath. Cardiac: Denies chest pain, palpitations, orthopnea, paroxysmal nocturnal dyspnea. GI: Denies abdominal pain, nausea, vomiting, diarrhea or constipation GU: Denies dysuria, frequency, hesitancy or incontinence MS: Denies muscle aches, joint pain or swelling Neuro: Denies headache, neurologic deficits (focal weakness, numbness, tingling), abnormal gait Psych: Denies anxiety, depression, SI/HI/AVH Skin: Denies new rashes or lesions ID: Denies sick contacts, exotic exposures, travel  Examination:  General exam: Appears calm and comfortable.  Elderly frail Respiratory system: Clear to auscultation. Respiratory effort normal. Cardiovascular system: S1 & S2 heard, RRR. No JVD, murmurs, rubs, gallops or clicks. No pedal edema. Gastrointestinal system: Abdomen is nondistended, soft and nontender. No organomegaly or masses felt. Normal bowel sounds heard. Central nervous system: Alert to name, neuro exam Extremities: Symmetric 5 x 5 power. Skin: No rashes, lesions or ulcers Psychiatry: Poor judgment and insight Objective: Vitals:   10/01/20 1647 10/01/20 2004 10/02/20 0400 10/02/20 1318  BP: 102/73 102/77 100/62 105/62  Pulse: 92 87 70 82  Resp: '17 20 18 14  '$ Temp: 98.3 F (36.8 C) 98.4 F (36.9 C) 98 F (36.7 C) 98.4 F (36.9 C)  TempSrc: Oral Oral Axillary Oral  SpO2: 99% 98% 100% 98%    Intake/Output Summary (  Last 24 hours) at 10/02/2020 1619 Last data filed at 10/02/2020 1451 Gross per 24 hour  Intake 1505.47 ml  Output 1000 ml  Net 505.47 ml   There were no vitals filed for this visit.   Data Reviewed:   CBC: Recent Labs  Lab 09/29/20 1725 10/01/20 0500 10/02/20 0403  WBC 6.8 6.8 7.9  NEUTROABS  --  4.0 5.4  HGB 11.6* 10.6* 11.5*  HCT 37.0  34.6* 36.2  MCV 97.1 97.7 94.8  PLT 226 204 0000000   Basic Metabolic Panel: Recent Labs  Lab 09/29/20 1725 10/01/20 0500 10/02/20 0403  NA 137 140 141  K 4.2 4.2 4.3  CL 104 108 110  CO2 22 20* 21*  GLUCOSE 110* 102* 172*  BUN 43* 49* 54*  CREATININE 2.63* 2.33* 1.93*  CALCIUM 8.4* 8.1* 8.1*  MG  --  2.0  --   PHOS  --  4.6  --    GFR: Estimated Creatinine Clearance: 18.1 mL/min (A) (by C-G formula based on SCr of 1.93 mg/dL (H)). Liver Function Tests: Recent Labs  Lab 09/30/20 1125 10/01/20 0500 10/02/20 0403  AST 34 41 41  ALT '19 23 21  '$ ALKPHOS 70 71 68  BILITOT 0.3 0.3 0.2*  PROT 6.3* 6.0* 5.9*  ALBUMIN 3.3* 3.1* 3.1*   No results for input(s): LIPASE, AMYLASE in the last 168 hours. No results for input(s): AMMONIA in the last 168 hours. Coagulation Profile: No results for input(s): INR, PROTIME in the last 168 hours. Cardiac Enzymes: No results for input(s): CKTOTAL, CKMB, CKMBINDEX, TROPONINI in the last 168 hours. BNP (last 3 results) No results for input(s): PROBNP in the last 8760 hours. HbA1C: No results for input(s): HGBA1C in the last 72 hours. CBG: No results for input(s): GLUCAP in the last 168 hours. Lipid Profile: No results for input(s): CHOL, HDL, LDLCALC, TRIG, CHOLHDL, LDLDIRECT in the last 72 hours. Thyroid Function Tests: No results for input(s): TSH, T4TOTAL, FREET4, T3FREE, THYROIDAB in the last 72 hours. Anemia Panel: Recent Labs    09/30/20 1125 10/01/20 0500  FERRITIN 123 148   Sepsis Labs: Recent Labs  Lab 09/30/20 1125  PROCALCITON 0.10    Recent Results (from the past 240 hour(s))  Resp Panel by RT-PCR (Flu A&B, Covid) Nasopharyngeal Swab     Status: Abnormal   Collection Time: 09/29/20  5:05 PM   Specimen: Nasopharyngeal Swab; Nasopharyngeal(NP) swabs in vial transport medium  Result Value Ref Range Status   SARS Coronavirus 2 by RT PCR POSITIVE (A) NEGATIVE Final    Comment: RESULT CALLED TO, READ BACK BY AND VERIFIED  WITH: RN S.MICHELL AT 1846 ON 09/29/2020 BY T.SAAD (NOTE) SARS-CoV-2 target nucleic acids are DETECTED.  The SARS-CoV-2 RNA is generally detectable in upper respiratory specimens during the acute phase of infection. Positive results are indicative of the presence of the identified virus, but do not rule out bacterial infection or co-infection with other pathogens not detected by the test. Clinical correlation with patient history and other diagnostic information is necessary to determine patient infection status. The expected result is Negative.  Fact Sheet for Patients: EntrepreneurPulse.com.au  Fact Sheet for Healthcare Providers: IncredibleEmployment.be  This test is not yet approved or cleared by the [place] FDA and  has been authorized for detection and/or diagnosis of SARS-CoV-2 by FDA under an Emergency Use Authorization (EUA).  This EUA will remain in effect (meaning this  test can be used) for the duration of  the COVID-19 declaration under Section  564(b)(1) of the Act, 21 U.S.C. section 360bbb-3(b)(1), unless the authorization is terminated or revoked sooner.     Influenza A by PCR NEGATIVE NEGATIVE Final   Influenza B by PCR NEGATIVE NEGATIVE Final    Comment: (NOTE) The Xpert Xpress SARS-CoV-2/FLU/RSV plus assay is intended as an aid in the diagnosis of influenza from Nasopharyngeal swab specimens and should not be used as a sole basis for treatment. Nasal washings and aspirates are unacceptable for Xpert Xpress SARS-CoV-2/FLU/RSV testing.  Fact Sheet for Patients: EntrepreneurPulse.com.au  Fact Sheet for Healthcare Providers: IncredibleEmployment.be  This test is not yet approved or cleared by the [place] FDA and has been authorized for detection and/or diagnosis of SARS-CoV-2 by FDA under an Emergency Use Authorization (EUA). This EUA will remain in effect (meaning this test  can be used) for the duration of the COVID-19 declaration under Section 564(b)(1) of the Act, 21 U.S.C. section 360bbb-3(b)(1), unless the authorization is terminated or revoked.  Performed at [facility], [place] [address], [place], [place] [address]          Radiology Studies: No results found.      Scheduled Meds: . amLODipine  5 mg Oral Daily  . vitamin C  500 mg Oral Daily  . aspirin EC  81 mg Oral Daily  . atorvastatin  40 mg Oral Daily  . feeding supplement  237 mL Oral BID BM  . heparin  5,000 Units Subcutaneous Q8H  . memantine  5 mg Oral BID  . senna-docusate  1 tablet Oral QHS  . sodium chloride flush  3 mL Intravenous Q12H  . zinc sulfate  220 mg Oral Daily   Continuous Infusions: . sodium chloride Stopped (09/30/20 2324)  . cefTRIAXone (ROCEPHIN)  IV Stopped (10/01/20 1649)  . remdesivir 100 mg in NS 100 mL 100 mg (10/02/20 1100)     LOS: 2 days   Time spent= 35 mins    Jenan Ellegood Arsenio Loader, MD [facility]  If 7PM-7AM, please contact night-coverage  10/02/2020, 4:19 PM

## 2020-10-03 ENCOUNTER — Other Ambulatory Visit: Payer: Medicare Other

## 2020-10-03 DIAGNOSIS — N390 Urinary tract infection, site not specified: Secondary | ICD-10-CM

## 2020-10-03 LAB — COMPREHENSIVE METABOLIC PANEL
ALT: 22 U/L (ref 0–44)
AST: 30 U/L (ref 15–41)
Albumin: 3.2 g/dL — ABNORMAL LOW (ref 3.5–5.0)
Alkaline Phosphatase: 74 U/L (ref 38–126)
Anion gap: 13 (ref 5–15)
BUN: 55 mg/dL — ABNORMAL HIGH (ref 8–23)
CO2: 19 mmol/L — ABNORMAL LOW (ref 22–32)
Calcium: 8.2 mg/dL — ABNORMAL LOW (ref 8.9–10.3)
Chloride: 110 mmol/L (ref 98–111)
Creatinine, Ser: 1.72 mg/dL — ABNORMAL HIGH (ref 0.44–1.00)
GFR, Estimated: 31 mL/min — ABNORMAL LOW (ref >60)
Glucose, Bld: 94 mg/dL (ref 70–99)
Potassium: 4.1 mmol/L (ref 3.5–5.1)
Sodium: 142 mmol/L (ref 135–145)
Total Bilirubin: 0.7 mg/dL (ref 0.3–1.2)
Total Protein: 6.2 g/dL — ABNORMAL LOW (ref 6.5–8.1)

## 2020-10-03 LAB — CBC WITH DIFFERENTIAL/PLATELET
Abs Immature Granulocytes: 0.04 10*3/uL (ref 0.00–0.07)
Basophils Absolute: 0 10*3/uL (ref 0.0–0.1)
Basophils Relative: 0 %
Eosinophils Absolute: 0.1 10*3/uL (ref 0.0–0.5)
Eosinophils Relative: 1 %
HCT: 35.5 % — ABNORMAL LOW (ref 36.0–46.0)
Hemoglobin: 11.2 g/dL — ABNORMAL LOW (ref 12.0–15.0)
Immature Granulocytes: 1 %
Lymphocytes Relative: 32 %
Lymphs Abs: 2.8 10*3/uL (ref 0.7–4.0)
MCH: 30 pg (ref 26.0–34.0)
MCHC: 31.5 g/dL (ref 30.0–36.0)
MCV: 95.2 fL (ref 80.0–100.0)
Monocytes Absolute: 0.9 10*3/uL (ref 0.1–1.0)
Monocytes Relative: 10 %
Neutro Abs: 4.9 10*3/uL (ref 1.7–7.7)
Neutrophils Relative %: 56 %
Platelets: 230 10*3/uL (ref 150–400)
RBC: 3.73 MIL/uL — ABNORMAL LOW (ref 3.87–5.11)
RDW: 12.6 % (ref 11.5–15.5)
WBC: 8.7 10*3/uL (ref 4.0–10.5)
nRBC: 0 % (ref 0.0–0.2)

## 2020-10-03 LAB — C-REACTIVE PROTEIN: CRP: 3.7 mg/dL — ABNORMAL HIGH (ref <1.0)

## 2020-10-03 LAB — D-DIMER, QUANTITATIVE: D-Dimer, Quant: 1.99 ug/mL-FEU — ABNORMAL HIGH (ref 0.00–0.50)

## 2020-10-03 NOTE — Evaluation (Signed)
Physical Therapy Evaluation Patient Details Name: Melinda Olson MRN: [id number] DOB: 1946/05/05 Today's Date: 10/03/2020   History of Present Illness  75 y.o. 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 Covid +  Clinical Impression  Pt admitted secondary to problem above with deficits below. Pt with cognitive deficits, so unsure of baseline. Requiring mod A for bed mobility. Could not keep feet in appropriate position when attempting to stand this session, so will need +2 to safely perform further mobility. Feel if pt is close to baseline and has necessary support can d/c home with family support and max HH with DME below. However, if pt is not at baseline, recommend SNF level therapies. Will continue to follow acutely.     Follow Up Recommendations SNF;Supervision/Assistance - 24 hour (unless family can provide necessary assist.)    Equipment Recommendations  Wheelchair (measurements PT);Wheelchair cushion (measurements PT);Hospital bed;Other (comment) (hoyer lift and hoyer lift pad)    Recommendations for Other Services       Precautions / Restrictions Precautions Precautions: Fall Restrictions Weight Bearing Restrictions: No      Mobility  Bed Mobility Overal bed mobility: Needs Assistance Bed Mobility: Supine to Sit;Sit to Supine     Supine to sit: Mod assist Sit to supine: Mod assist   General bed mobility comments: Mod A for trunk assist and LE assist. Increased time required.    Transfers                 General transfer comment: Attempted to stand, however, pt unable to keep feet in appropriate postion to stand. Pt kicking feet out despite attempts to manually block. Will need +2 to attempt safely.  Ambulation/Gait                Stairs            Wheelchair Mobility    Modified Rankin (Stroke  Patients Only)       Balance Overall balance assessment: Needs assistance Sitting-balance support: Bilateral upper extremity supported;Feet supported Sitting balance-Leahy Scale: Poor Sitting balance - Comments: Reliant on BUE support                                     Pertinent Vitals/Pain Pain Assessment: Faces Faces Pain Scale: No hurt    Home Living Family/patient expects to be discharged to:: Private residence Living Arrangements: Children Available Help at Discharge: Family;Available 24 hours/day Type of Home: House Home Access: Ramped entrance     Home Layout: One level Home Equipment: Walker - 4 wheels;Wheelchair - manual Additional Comments: information taken from previous note as pt unable to report    Prior Function Level of Independence: Needs assistance   Gait / Transfers Assistance Needed: Pt reports she has been using WC, however, per previous notes, pt was using rollator for ambulation.  ADL's / Homemaking Assistance Needed: requries assistance for most ADLs including bathing and dressing per previous notes.        Hand Dominance        Extremity/Trunk Assessment   Upper Extremity Assessment Upper Extremity Assessment: Defer to OT evaluation    Lower Extremity Assessment Lower Extremity Assessment: RLE deficits/detail;LLE deficits/detail RLE Deficits / Details: Pt unable to keep R foot on ground when attempting to stand. Checked ROM in supine, and pt  lacking ~5-10 deg of extension, however, flexion WFL. LLE Deficits / Details: PROM WFL    Cervical / Trunk Assessment Cervical / Trunk Assessment: Kyphotic  Communication   Communication: Expressive difficulties  Cognition Arousal/Alertness: Awake/alert Behavior During Therapy: Restless Overall Cognitive Status: No family/caregiver present to determine baseline cognitive functioning                                 General Comments: Pt very emotionally labile  throughout. Crying when attempting mobility tasks. Unsure of baseline      General Comments      Exercises     Assessment/Plan    PT Assessment Patient needs continued PT services  PT Problem List Decreased strength;Decreased balance;Decreased mobility;Decreased cognition;Decreased knowledge of use of DME;Decreased safety awareness;Decreased coordination;Decreased knowledge of precautions       PT Treatment Interventions DME instruction;Gait training;Therapeutic activities;Functional mobility training;Balance training;Therapeutic exercise;Patient/family education;Cognitive remediation    PT Goals (Current goals can be found in the Care Plan section)  Acute Rehab PT Goals PT Goal Formulation: Patient unable to participate in goal setting Time For Goal Achievement: 10/17/20 Potential to Achieve Goals: Fair    Frequency Min 2X/week   Barriers to discharge        Co-evaluation               AM-PAC PT "6 Clicks" Mobility  Outcome Measure Help needed turning from your back to your side while in a flat bed without using bedrails?: A Lot Help needed moving from lying on your back to sitting on the side of a flat bed without using bedrails?: A Lot Help needed moving to and from a bed to a chair (including a wheelchair)?: Total Help needed standing up from a chair using your arms (e.g., wheelchair or bedside chair)?: Total Help needed to walk in hospital room?: Total Help needed climbing 3-5 steps with a railing? : Total 6 Click Score: 8    End of Session   Activity Tolerance: Patient tolerated treatment well Patient left: in bed;with call bell/phone within reach;with bed alarm set Nurse Communication: Mobility status PT Visit Diagnosis: Unsteadiness on feet (R26.81);Muscle weakness (generalized) (M62.81);Difficulty in walking, not elsewhere classified (R26.2)    Time: [id number] PT Time Calculation (min) (ACUTE ONLY): 18 min   Charges:   PT Evaluation $PT Eval  Moderate Complexity: 1 Mod          Reuel Derby, PT, DPT  Acute Rehabilitation Services  Pager: [phone] Office: [phone]   Rudean Hitt 10/03/2020, 5:03 PM

## 2020-10-03 NOTE — Progress Notes (Signed)
PROGRESS NOTE    Melinda Olson  [id number] DOB: October 04, 1945 DOA: 09/29/2020 PCP: Scheryl Marten, PA   Brief Narrative:  75 year old with history of HTN, CVA with residual deficit brought from home with concerns of altered mental status.  She was found to have mild COVID-19 pneumonia, AKI and urinary tract infection.  She was started on remdesivir, IV fluids and Rocephin.  She did not require steroids as her chest x-ray was clear.   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  -Saturating well. Continue supportive care. No need for steroids.  -Remdesivir-day 3 -CXR- overall clear.   Acute renal failure: -Prerenal in nature.  Baseline creatinine 1.1.  Admission creatinine 2.6.  Improving with IV fluids.  Creatinine today 1.73  Mixed dementia:  -Continue Namenda.  Follows outpatient Dr. Leonie Man  UTI -Rocephin day 3.  Urine cultures- E. coli, sensitivities pending  Essential hypertension: -Continue amlodipine 5 mg daily -Hydralazine IV as needed  History of CVA with residual deficit:  -Patient noted to haveissues with memory, emotional instability, and limited free speech possibly all related with previous stroke inAugust 2021.With Dr. Leonie Man of neurology. -Continue aspirin and statin  Hyperlipidemia -Continue atorvastatin 40 mg daily    DVT prophylaxis: heparin injection 5,000 Units Start: 09/30/20 1400  Code Status: Full code Family Communication: Son updated  Status is: Inpatient  Remains inpatient appropriate because:IV treatments appropriate due to intensity of illness or inability to take PO   Dispo: The patient is from: Home              Anticipated d/c is to: Home              Anticipated d/c date is: 1 day              Patient currently is not medically stable to d/c.  Patient still has  poor oral intake.  In the setting of AKI, continue 1 more day of IV fluids.  Hopefully we can discharge her tomorrow.  In the meantime we will get PT OT   There is no height or weight on file to calculate BMI.      Subjective: Sitting up in the chair doing better today.  No complaints.  Denies any urinary burning sensation.  Tolerating her breakfast.  Review of Systems Otherwise negative except as per HPI, including: General: Denies fever, chills, night sweats or unintended weight loss. Resp: Denies cough, wheezing, shortness of breath. Cardiac: Denies chest pain, palpitations, orthopnea, paroxysmal nocturnal dyspnea. GI: Denies abdominal pain, nausea, vomiting, diarrhea or constipation GU: Denies dysuria, frequency, hesitancy or incontinence MS: Denies muscle aches, joint pain or swelling Neuro: Denies headache, neurologic deficits (focal weakness, numbness, tingling), abnormal gait Psych: Denies anxiety, depression, SI/HI/AVH Skin: Denies new rashes or lesions ID: Denies sick contacts, exotic exposures, travel  Examination: Constitutional: Not in acute distress Respiratory: Clear to auscultation bilaterally Cardiovascular: Normal sinus rhythm, no rubs Abdomen: Nontender nondistended good bowel sounds Musculoskeletal: No edema noted Skin: No rashes seen Neurologic: CN 2-12 grossly intact.  And nonfocal Psychiatric: Normal judgment and insight. Alert and oriented x 3. Normal mood.  Objective: Vitals:   10/02/20 0400 10/02/20 1318 10/02/20 2109 10/03/20 0414  BP: 100/62 105/62 (!) 120/96 114/79  Pulse: 70 82 100 96  Resp: '18 14 17 20  '$ Temp: 98 F (36.7 C)  98.4 F (36.9 C) 98.5 F (36.9 C) 98.5 F (36.9 C)  TempSrc: Axillary Oral Axillary Axillary  SpO2: 100% 98% 98% 98%    Intake/Output Summary (Last 24 hours) at 10/03/2020 0829 Last data filed at 10/03/2020 [id number] Gross per 24 hour  Intake 483 ml  Output 750 ml  Net -267 ml   There were no vitals filed for this  visit.   Data Reviewed:   CBC: Recent Labs  Lab 09/29/20 1725 10/01/20 0500 10/02/20 0403 10/03/20 0247  WBC 6.8 6.8 7.9 8.7  NEUTROABS  --  4.0 5.4 4.9  HGB 11.6* 10.6* 11.5* 11.2*  HCT 37.0 34.6* 36.2 35.5*  MCV 97.1 97.7 94.8 95.2  PLT 226 204 233 [id number]   Basic Metabolic Panel: Recent Labs  Lab 09/29/20 1725 10/01/20 0500 10/02/20 0403 10/03/20 0247  NA 137 140 141 142  K 4.2 4.2 4.3 4.1  CL 104 108 110 110  CO2 22 20* 21* 19*  GLUCOSE 110* 102* 172* 94  BUN 43* 49* 54* 55*  CREATININE 2.63* 2.33* 1.93* 1.72*  CALCIUM 8.4* 8.1* 8.1* 8.2*  MG  --  2.0  --   --   PHOS  --  4.6  --   --    GFR: Estimated Creatinine Clearance: 20.3 mL/min (A) (by C-G formula based on SCr of 1.72 mg/dL (H)). Liver Function Tests: Recent Labs  Lab 09/30/20 1125 10/01/20 0500 10/02/20 0403 10/03/20 0247  AST 34 41 41 30  ALT '19 23 21 22  '$ ALKPHOS 70 71 68 74  BILITOT 0.3 0.3 0.2* 0.7  PROT 6.3* 6.0* 5.9* 6.2*  ALBUMIN 3.3* 3.1* 3.1* 3.2*   No results for input(s): LIPASE, AMYLASE in the last 168 hours. No results for input(s): AMMONIA in the last 168 hours. Coagulation Profile: No results for input(s): INR, PROTIME in the last 168 hours. Cardiac Enzymes: No results for input(s): CKTOTAL, CKMB, CKMBINDEX, TROPONINI in the last 168 hours. BNP (last 3 results) No results for input(s): PROBNP in the last 8760 hours. HbA1C: No results for input(s): HGBA1C in the last 72 hours. CBG: No results for input(s): GLUCAP in the last 168 hours. Lipid Profile: No results for input(s): CHOL, HDL, LDLCALC, TRIG, CHOLHDL, LDLDIRECT in the last 72 hours. Thyroid Function Tests: No results for input(s): TSH, T4TOTAL, FREET4, T3FREE, THYROIDAB in the last 72 hours. Anemia Panel: Recent Labs    09/30/20 1125 10/01/20 0500  FERRITIN 123 148   Sepsis Labs: Recent Labs  Lab 09/30/20 1125  PROCALCITON 0.10    Recent Results (from the past 240 hour(s))  Resp Panel by RT-PCR (Flu A&B,  Covid) Nasopharyngeal Swab     Status: Abnormal   Collection Time: 09/29/20  5:05 PM   Specimen: Nasopharyngeal Swab; Nasopharyngeal(NP) swabs in vial transport medium  Result Value Ref Range Status   SARS Coronavirus 2 by RT PCR POSITIVE (A) NEGATIVE Final    Comment: RESULT CALLED TO, READ BACK BY AND VERIFIED WITH: RN S.MICHELL AT 1846 ON 09/29/2020 BY T.SAAD (NOTE) SARS-CoV-2 target nucleic acids are DETECTED.  The SARS-CoV-2 RNA is generally detectable in upper respiratory specimens during the acute phase of infection. Positive results are indicative of the presence of the identified virus, but do not rule out bacterial infection or co-infection with other pathogens not detected by the test. Clinical correlation with patient history and other diagnostic information is necessary to determine patient infection status. The expected result is Negative.  Fact Sheet for Patients: EntrepreneurPulse.com.au  Fact Sheet  for Healthcare Providers: IncredibleEmployment.be  This test is not yet approved or cleared by the [place] and  has been authorized for detection and/or diagnosis of SARS-CoV-2 by FDA under an Emergency Use Authorization (EUA).  This EUA will remain in effect (meaning this  test can be used) for the duration of  the COVID-19 declaration under Section 564(b)(1) of the Act, 21 U.S.C. section 360bbb-3(b)(1), unless the authorization is terminated or revoked sooner.     Influenza A by PCR NEGATIVE NEGATIVE Final   Influenza B by PCR NEGATIVE NEGATIVE Final    Comment: (NOTE) The Xpert Xpress SARS-CoV-2/FLU/RSV plus assay is intended as an aid in the diagnosis of influenza from Nasopharyngeal swab specimens and should not be used as a sole basis for treatment. Nasal washings and aspirates are unacceptable for Xpert Xpress SARS-CoV-2/FLU/RSV testing.  Fact Sheet for Patients: EntrepreneurPulse.com.au  Fact  Sheet for Healthcare Providers: IncredibleEmployment.be  This test is not yet approved or cleared by the [place] FDA and has been authorized for detection and/or diagnosis of SARS-CoV-2 by FDA under an Emergency Use Authorization (EUA). This EUA will remain in effect (meaning this test can be used) for the duration of the COVID-19 declaration under Section 564(b)(1) of the Act, 21 U.S.C. section 360bbb-3(b)(1), unless the authorization is terminated or revoked.  Performed at [facility], [place] [address], [place], [place] [address]   Culture, Urine     Status: Abnormal (Preliminary result)   Collection Time: 10/01/20  3:47 PM   Specimen: Urine, Random  Result Value Ref Range Status   Specimen Description URINE, RANDOM  Final   Special Requests   Final    NONE Performed at [facility], [place] [address], [place],  [address]    Culture >=100,000 COLONIES/mL GRAM NEGATIVE RODS (A)  Final   Report Status PENDING  Incomplete         Radiology Studies: No results found.      Scheduled Meds: . amLODipine  5 mg Oral Daily  . vitamin C  500 mg Oral Daily  . aspirin EC  81 mg Oral Daily  . atorvastatin  40 mg Oral Daily  . feeding supplement  237 mL Oral BID BM  . heparin  5,000 Units Subcutaneous Q8H  . memantine  5 mg Oral BID  . senna-docusate  1 tablet Oral QHS  . sodium chloride flush  3 mL Intravenous Q12H  . zinc sulfate  220 mg Oral Daily   Continuous Infusions: . sodium chloride 75 mL/hr at 10/03/20 0635  . cefTRIAXone (ROCEPHIN)  IV Stopped (10/03/20 0251)  . remdesivir 100 mg in NS 100 mL Stopped (10/02/20 2231)     LOS: 3 days   Time spent= 35 mins    Glynn Yepes Arsenio Loader, MD [facility]  If 7PM-7AM, please contact night-coverage  10/03/2020, 8:29 AM

## 2020-10-03 NOTE — Care Management Important Message (Signed)
Important Message  Patient Details  Name: Melinda Olson MRN: [id number] Date of Birth: [birth year]   Medicare Important Message Given:  Yes - Important Message mailed due to current [facility] Delorse Lek 10/03/2020, 2:31 PM

## 2020-10-04 ENCOUNTER — Other Ambulatory Visit (HOSPITAL_COMMUNITY): Payer: Self-pay | Admitting: Internal Medicine

## 2020-10-04 LAB — CBC WITH DIFFERENTIAL/PLATELET
Abs Immature Granulocytes: 0.04 10*3/uL (ref 0.00–0.07)
Basophils Absolute: 0 10*3/uL (ref 0.0–0.1)
Basophils Relative: 0 %
Eosinophils Absolute: 0 10*3/uL (ref 0.0–0.5)
Eosinophils Relative: 0 %
HCT: 33.8 % — ABNORMAL LOW (ref 36.0–46.0)
Hemoglobin: 10.7 g/dL — ABNORMAL LOW (ref 12.0–15.0)
Immature Granulocytes: 1 %
Lymphocytes Relative: 29 %
Lymphs Abs: 2.3 10*3/uL (ref 0.7–4.0)
MCH: 30.1 pg (ref 26.0–34.0)
MCHC: 31.7 g/dL (ref 30.0–36.0)
MCV: 94.9 fL (ref 80.0–100.0)
Monocytes Absolute: 0.8 10*3/uL (ref 0.1–1.0)
Monocytes Relative: 11 %
Neutro Abs: 4.5 10*3/uL (ref 1.7–7.7)
Neutrophils Relative %: 59 %
Platelets: 203 10*3/uL (ref 150–400)
RBC: 3.56 MIL/uL — ABNORMAL LOW (ref 3.87–5.11)
RDW: 12.7 % (ref 11.5–15.5)
WBC: 7.7 10*3/uL (ref 4.0–10.5)
nRBC: 0 % (ref 0.0–0.2)

## 2020-10-04 LAB — URINE CULTURE: Culture: >=100000 — AB

## 2020-10-04 LAB — COMPREHENSIVE METABOLIC PANEL
ALT: 19 U/L (ref 0–44)
AST: 32 U/L (ref 15–41)
Albumin: 2.9 g/dL — ABNORMAL LOW (ref 3.5–5.0)
Alkaline Phosphatase: 66 U/L (ref 38–126)
Anion gap: 12 (ref 5–15)
BUN: 51 mg/dL — ABNORMAL HIGH (ref 8–23)
CO2: 19 mmol/L — ABNORMAL LOW (ref 22–32)
Calcium: 8.3 mg/dL — ABNORMAL LOW (ref 8.9–10.3)
Chloride: 111 mmol/L (ref 98–111)
Creatinine, Ser: 1.6 mg/dL — ABNORMAL HIGH (ref 0.44–1.00)
GFR, Estimated: 33 mL/min — ABNORMAL LOW (ref >60)
Glucose, Bld: 108 mg/dL — ABNORMAL HIGH (ref 70–99)
Potassium: 4.1 mmol/L (ref 3.5–5.1)
Sodium: 142 mmol/L (ref 135–145)
Total Bilirubin: 0.4 mg/dL (ref 0.3–1.2)
Total Protein: 5.6 g/dL — ABNORMAL LOW (ref 6.5–8.1)

## 2020-10-04 LAB — D-DIMER, QUANTITATIVE: D-Dimer, Quant: 1.41 ug/mL-FEU — ABNORMAL HIGH (ref 0.00–0.50)

## 2020-10-04 LAB — C-REACTIVE PROTEIN: CRP: 6.9 mg/dL — ABNORMAL HIGH (ref <1.0)

## 2020-10-04 MED ORDER — CIPROFLOXACIN HCL 500 MG PO TABS: 500.0000 mg | ORAL_TABLET | Freq: Every day | ORAL | 0 refills | Status: DC

## 2020-10-04 MED ORDER — CIPROFLOXACIN HCL 500 MG PO TABS
500.0000 mg | ORAL_TABLET | Freq: Every day | ORAL | Status: DC
  Administered 2020-10-04: 500 mg via ORAL
  Filled 2020-10-04: qty 1

## 2020-10-04 MED FILL — CIPROFLOXACIN HCL 500 MG TA: 500 | 7 days supply | Qty: 7 | Fill #0

## 2020-10-04 NOTE — Progress Notes (Signed)
Patient was discharged home with home health by MD order; discharged instructions review and give to patient's son with care notes; IV DIC; antibiotic was delivered to patient's room by [facility] and patient's son was instructed how to administraet the antibiotic; patient will be escorted to the car by nurse tech via wheelchair.

## 2020-10-04 NOTE — Discharge Summary (Signed)
Physician Discharge Summary  Foster Stogsdill [id number] DOB: Nov 09, 1945 DOA: 09/29/2020  PCP: Scheryl Marten, PA  Admit date: 09/29/2020 Discharge date: 10/04/2020  Admitted From: Home Disposition: Home  Recommendations for Outpatient Follow-up:  1. Follow up with PCP in 1-2 weeks 2. Please obtain BMP/CBC in one week your next doctors visit.  3. Cipro 500 daily for 7 days.  Dose adjusted for renal function  Home Health: Home health services arranged by [facility], family declined SNF Equipment/Devices: Arranged by [facility] Discharge Condition: Stable CODE STATUS: Full code Diet recommendation: Regular as tolerated  Brief/Interim Summary: 75 year old with history of HTN, CVA with residual deficit brought from home with concerns of altered mental status.  She was found to have mild COVID-19 pneumonia, AKI and urinary tract infection.  She was started on remdesivir, IV fluids and Rocephin.  She did not require steroids as her chest x-ray was clear.  Patient ended up growing ESBL in her urine therefore antibiotics were transitioned to Cipro for 7 more days which was adjusted according to renal function.  PT recommended SNF but family and patient declined this therefore she is going home with home health services. Discussed patient's case in detail with her son on the day of discharge.  All the questions answered.  Stable for DC today.   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  Asymptomatic -Doing well on room air, chest x-ray clear. -Remdesivir-day 3, completed -CXR- overall clear.   Acute renal failure:Improving -Prerenal in nature.  Baseline creatinine 1.1.  Admission creatinine 2.6.  Improving with IV fluids.  Creatinine today 1.6.  Advised to continue oral hydration  Mixed dementia: -Continue Namenda.  Follows outpatient  Dr. Leonie Man  UTI, ESBL -Transition Rocephin to 7 more days of oral Cipro.  Urine culture showing ESBL  Essential hypertension: -Continue home medicine  History of CVA with residual deficit: -Patient noted to haveissues with memory, emotional instability, and limited free speech possibly all related with previous stroke inAugust 2021.With Dr. Leonie Man of neurology. -Continue aspirin and statin  Hyperlipidemia -Continue atorvastatin 40 mg daily  PT recommended SNF, patient and family declined therefore going home with home health   There is no height or weight on file to calculate BMI.         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  Had confusion overnight therefore mittens had to be placed but this is something usual for her due to ongoing dementia/delirium.  Son has been updated.  Discharge Exam: Vitals:   10/03/20 2022 10/04/20 0400  BP: 117/71 103/71  Pulse: 100 84  Resp: 18 17  Temp: 98.4 F (36.9 C) 98.4 F (36.9 C)  SpO2: 99% 100%   Vitals:   10/03/20 0414 10/03/20 1329 10/03/20 2022 10/04/20 0400  BP: 114/79 (!) 141/43 117/71 103/71  Pulse: 96 92 100 84  Resp: '20 13 18 17  '$ Temp: 98.5 F (36.9 C) 98.4 F (36.9 C) 98.4 F (36.9 C) 98.4 F (36.9 C)  TempSrc: Axillary Oral Oral Axillary  SpO2: 98%  99% 100%    General: Pt is alert, awake, disoriented to place and time.  Not in acute distress, elderly frail Cardiovascular: RRR, S1/S2 +, no rubs, no gallops Respiratory: CTA bilaterally, no wheezing, no rhonchi Abdominal: Soft, NT, ND, bowel sounds + Extremities: no edema, no cyanosis  Discharge Instructions   Allergies as of 10/04/2020   No Known Allergies     Medication List    TAKE these medications   amLODipine 5 MG  tablet Commonly known as: NORVASC Take 1 tablet (5 mg total) by mouth daily.   aspirin 81 MG EC tablet Take 1 tablet (81 mg total) by mouth daily. Swallow whole.   atorvastatin 40 MG tablet Commonly known as: LIPITOR Take 1 tablet (40 mg total) by mouth daily.   ciprofloxacin 500 MG tablet Commonly known as: CIPRO Take 1 tablet (500 mg total) by mouth daily with breakfast.   cyanocobalamin 1000 MCG tablet Take 1 tablet (1,000 mcg total) by mouth daily.   folic acid 1 MG tablet Commonly known as: FOLVITE Take 1 tablet (1 mg total) by mouth daily.   memantine tablet pack Commonly known as: Namenda Titration Pak 5 mg/day for =1 week; 5 mg twice daily for =1 week; 15 mg/day given in 5 mg and 10 mg separated doses for =1 week; then 10 mg twice daily   memantine 10 MG tablet Commonly known as: Namenda Take 1 tablet (10 mg total) by mouth 2 (two) times daily.   multivitamin with minerals Tabs tablet Take 1 tablet by mouth daily.   senna-docusate 8.6-50 MG tablet Commonly known as: Senokot-S Take 2 tablets by mouth at bedtime as needed for mild constipation. What changed:   how much to take  when to take this   thiamine 100 MG tablet Take 1 tablet (100 mg total) by mouth daily.       Follow-up Information    Home, [facility] Follow up.   Specialty: [place] Why: Active with Home Health PT Services Contact information: [place] [address] [phone]        [address], [place], [place]. Schedule an appointment as soon as possible for a visit in 1 week(s).   Specialty: Internal Medicine Contact information: [place] [address] [phone]              No Known Allergies  You were cared for by a hospitalist during your hospital stay. If you have any questions about your discharge medications or the care you received while you were in the hospital after you are discharged, you can call the  unit and asked to speak with the hospitalist on call if the hospitalist that took care of you is not available. Once you are discharged, your primary care physician will handle any further medical issues. Please note that no refills for any discharge medications will be authorized once you are discharged, as it is imperative that you return to your primary care physician (or establish a relationship with a primary care physician if you do not have one) for your aftercare needs so that they can reassess your need for medications and monitor your lab values.   Procedures/Studies: DG Chest Portable 1 View  Result Date: 09/30/2020 CLINICAL DATA:  Cough EXAM: PORTABLE CHEST 1 VIEW COMPARISON:  May 14, 2020 FINDINGS: The lungs are clear. Heart is borderline enlarged with pulmonary vascularity normal. No adenopathy. There is aortic atherosclerosis. No bone lesions IMPRESSION: Borderline cardiac enlargement.  Lungs clear. Aortic Atherosclerosis (ICD10-I70.0). Electronically Signed   By: Lowella Grip  III M.D.   On: 09/30/2020 10:03      The results of significant diagnostics from this hospitalization (including imaging, microbiology, ancillary and laboratory) are listed below for reference.     Microbiology: Recent Results (from the past 240 hour(s))  Resp Panel by RT-PCR (Flu A&B, Covid) Nasopharyngeal Swab     Status: Abnormal   Collection Time: 09/29/20  5:05 PM   Specimen: Nasopharyngeal Swab; Nasopharyngeal(NP) swabs in vial transport medium  Result Value Ref Range Status   SARS Coronavirus 2 by RT PCR POSITIVE (A) NEGATIVE Final    Comment: RESULT CALLED TO, READ BACK BY AND VERIFIED WITH: RN S.MICHELL AT 1846 ON 09/29/2020 BY T.SAAD (NOTE) SARS-CoV-2 target nucleic acids are DETECTED.  The SARS-CoV-2 RNA is generally detectable in upper respiratory specimens during the acute phase of infection. Positive results are indicative of the presence of the identified virus, but do not  rule out bacterial infection or co-infection with other pathogens not detected by the test. Clinical correlation with patient history and other diagnostic information is necessary to determine patient infection status. The expected result is Negative.  Fact Sheet for Patients: EntrepreneurPulse.com.au  Fact Sheet for Healthcare Providers: IncredibleEmployment.be  This test is not yet approved or cleared by the [place] FDA and  has been authorized for detection and/or diagnosis of SARS-CoV-2 by FDA under an Emergency Use Authorization (EUA).  This EUA will remain in effect (meaning this  test can be used) for the duration of  the COVID-19 declaration under Section 564(b)(1) of the Act, 21 U.S.C. section 360bbb-3(b)(1), unless the authorization is terminated or revoked sooner.     Influenza A by PCR NEGATIVE NEGATIVE Final   Influenza B by PCR NEGATIVE NEGATIVE Final    Comment: (NOTE) The Xpert Xpress SARS-CoV-2/FLU/RSV plus assay is intended as an aid in the diagnosis of influenza from Nasopharyngeal swab specimens and should not be used as a sole basis for treatment. Nasal washings and aspirates are unacceptable for Xpert Xpress SARS-CoV-2/FLU/RSV testing.  Fact Sheet for Patients: EntrepreneurPulse.com.au  Fact Sheet for Healthcare Providers: IncredibleEmployment.be  This test is not yet approved or cleared by the [place] FDA and has been authorized for detection and/or diagnosis of SARS-CoV-2 by FDA under an Emergency Use Authorization (EUA). This EUA will remain in effect (meaning this test can be used) for the duration of the COVID-19 declaration under Section 564(b)(1) of the Act, 21 U.S.C. section 360bbb-3(b)(1), unless the authorization is terminated or revoked.  Performed at [facility], [place] [address], [place], [place] [address]   Culture, Urine     Status: Abnormal    Collection Time: 10/01/20  3:47 PM   Specimen: Urine, Random  Result Value Ref Range Status   Specimen Description URINE, RANDOM  Final   Special Requests   Final    NONE Performed at [facility], [place] [address], [place], [place] [address]    Culture (A)  Final    >=100,000 COLONIES/mL ESCHERICHIA COLI Confirmed Extended Spectrum Beta-Lactamase Producer (ESBL).  In bloodstream infections from ESBL organisms, carbapenems are preferred over piperacillin/tazobactam. They are shown to have a lower risk of mortality.    Report Status 10/04/2020 FINAL  Final   Organism ID, Bacteria ESCHERICHIA COLI (A)  Final      Susceptibility   Escherichia coli - MIC*    AMPICILLIN >=32 RESISTANT Resistant     CEFAZOLIN >=64 RESISTANT Resistant     CEFEPIME 1 SENSITIVE Sensitive  CEFTRIAXONE >=64 RESISTANT Resistant     CIPROFLOXACIN <=0.25 SENSITIVE Sensitive     GENTAMICIN >=16 RESISTANT Resistant     IMIPENEM <=0.25 SENSITIVE Sensitive     NITROFURANTOIN <=16 SENSITIVE Sensitive     TRIMETH/SULFA >=320 RESISTANT Resistant     AMPICILLIN/SULBACTAM 8 SENSITIVE Sensitive     PIP/TAZO <=4 SENSITIVE Sensitive     * >=100,000 COLONIES/mL ESCHERICHIA COLI     Labs: BNP (last 3 results) Recent Labs    09/30/20 1125  BNP 123456*   Basic Metabolic Panel: Recent Labs  Lab 09/29/20 1725 10/01/20 0500 10/02/20 0403 10/03/20 0247 10/04/20 0214  NA 137 140 141 142 142  K 4.2 4.2 4.3 4.1 4.1  CL 104 108 110 110 111  CO2 22 20* 21* 19* 19*  GLUCOSE 110* 102* 172* 94 108*  BUN 43* 49* 54* 55* 51*  CREATININE 2.63* 2.33* 1.93* 1.72* 1.60*  CALCIUM 8.4* 8.1* 8.1* 8.2* 8.3*  MG  --  2.0  --   --   --   PHOS  --  4.6  --   --   --    Liver Function Tests: Recent Labs  Lab 09/30/20 1125 10/01/20 0500 10/02/20 0403 10/03/20 0247 10/04/20 0214  AST 34 41 41 30 32  ALT '19 23 21 22 19  '$ ALKPHOS 70 71 68 74 66  BILITOT 0.3 0.3 0.2* 0.7 0.4  PROT 6.3* 6.0* 5.9* 6.2* 5.6*  ALBUMIN 3.3*  3.1* 3.1* 3.2* 2.9*   No results for input(s): LIPASE, AMYLASE in the last 168 hours. No results for input(s): AMMONIA in the last 168 hours. CBC: Recent Labs  Lab 09/29/20 1725 10/01/20 0500 10/02/20 0403 10/03/20 0247 10/04/20 0214  WBC 6.8 6.8 7.9 8.7 7.7  NEUTROABS  --  4.0 5.4 4.9 4.5  HGB 11.6* 10.6* 11.5* 11.2* 10.7*  HCT 37.0 34.6* 36.2 35.5* 33.8*  MCV 97.1 97.7 94.8 95.2 94.9  PLT 226 204 233 230 203   Cardiac Enzymes: No results for input(s): CKTOTAL, CKMB, CKMBINDEX, TROPONINI in the last 168 hours. BNP: Invalid input(s): POCBNP CBG: No results for input(s): GLUCAP in the last 168 hours. D-Dimer Recent Labs    10/03/20 0247 10/04/20 0214  DDIMER 1.99* 1.41*   Hgb A1c No results for input(s): HGBA1C in the last 72 hours. Lipid Profile No results for input(s): CHOL, HDL, LDLCALC, TRIG, CHOLHDL, LDLDIRECT in the last 72 hours. Thyroid function studies No results for input(s): TSH, T4TOTAL, T3FREE, THYROIDAB in the last 72 hours.  Invalid input(s): FREET3 Anemia work up No results for input(s): VITAMINB12, FOLATE, FERRITIN, TIBC, IRON, RETICCTPCT in the last 72 hours. Urinalysis    Component Value Date/Time   COLORURINE YELLOW 09/30/2020 1254   APPEARANCEUR HAZY (A) 09/30/2020 1254   LABSPEC 1.010 09/30/2020 1254   PHURINE 6.0 09/30/2020 1254   GLUCOSEU NEGATIVE 09/30/2020 1254   HGBUR SMALL (A) 09/30/2020 1254   BILIRUBINUR NEGATIVE 09/30/2020 1254   KETONESUR NEGATIVE 09/30/2020 1254   PROTEINUR 30 (A) 09/30/2020 1254   NITRITE NEGATIVE 09/30/2020 1254   LEUKOCYTESUR LARGE (A) 09/30/2020 1254   Sepsis Labs Invalid input(s): PROCALCITONIN,  WBC,  LACTICIDVEN Microbiology Recent Results (from the past 240 hour(s))  Resp Panel by RT-PCR (Flu A&B, Covid) Nasopharyngeal Swab     Status: Abnormal   Collection Time: 09/29/20  5:05 PM   Specimen: Nasopharyngeal Swab; Nasopharyngeal(NP) swabs in vial transport medium  Result Value Ref Range Status    SARS Coronavirus 2 by RT PCR POSITIVE (A)  NEGATIVE Final    Comment: RESULT CALLED TO, READ BACK BY AND VERIFIED WITH: RN S.MICHELL AT 1846 ON 09/29/2020 BY T.SAAD (NOTE) SARS-CoV-2 target nucleic acids are DETECTED.  The SARS-CoV-2 RNA is generally detectable in upper respiratory specimens during the acute phase of infection. Positive results are indicative of the presence of the identified virus, but do not rule out bacterial infection or co-infection with other pathogens not detected by the test. Clinical correlation with patient history and other diagnostic information is necessary to determine patient infection status. The expected result is Negative.  Fact Sheet for Patients: EntrepreneurPulse.com.au  Fact Sheet for Healthcare Providers: IncredibleEmployment.be  This test is not yet approved or cleared by the [place] FDA and  has been authorized for detection and/or diagnosis of SARS-CoV-2 by FDA under an Emergency Use Authorization (EUA).  This EUA will remain in effect (meaning this  test can be used) for the duration of  the COVID-19 declaration under Section 564(b)(1) of the Act, 21 U.S.C. section 360bbb-3(b)(1), unless the authorization is terminated or revoked sooner.     Influenza A by PCR NEGATIVE NEGATIVE Final   Influenza B by PCR NEGATIVE NEGATIVE Final    Comment: (NOTE) The Xpert Xpress SARS-CoV-2/FLU/RSV plus assay is intended as an aid in the diagnosis of influenza from Nasopharyngeal swab specimens and should not be used as a sole basis for treatment. Nasal washings and aspirates are unacceptable for Xpert Xpress SARS-CoV-2/FLU/RSV testing.  Fact Sheet for Patients: EntrepreneurPulse.com.au  Fact Sheet for Healthcare Providers: IncredibleEmployment.be  This test is not yet approved or cleared by the [place] FDA and has been authorized for detection and/or diagnosis of  SARS-CoV-2 by FDA under an Emergency Use Authorization (EUA). This EUA will remain in effect (meaning this test can be used) for the duration of the COVID-19 declaration under Section 564(b)(1) of the Act, 21 U.S.C. section 360bbb-3(b)(1), unless the authorization is terminated or revoked.  Performed at [facility], [place] [address] [place], [place] [address]   Culture, Urine     Status: Abnormal   Collection Time: 10/01/20  3:47 PM   Specimen: Urine, Random  Result Value Ref Range Status   Specimen Description URINE, RANDOM  Final   Special Requests   Final    NONE Performed at [facility], [place] [address], [place], [place] [address]    Culture (A)  Final    >=100,000 COLONIES/mL ESCHERICHIA COLI Confirmed Extended Spectrum Beta-Lactamase Producer (ESBL).  In bloodstream infections from ESBL organisms, carbapenems are preferred over piperacillin/tazobactam. They are shown to have a lower risk of mortality.    Report Status 10/04/2020 FINAL  Final   Organism ID, Bacteria ESCHERICHIA COLI (A)  Final      Susceptibility   Escherichia coli - MIC*    AMPICILLIN >=32 RESISTANT Resistant     CEFAZOLIN >=64 RESISTANT Resistant     CEFEPIME 1 SENSITIVE Sensitive     CEFTRIAXONE >=64 RESISTANT Resistant     CIPROFLOXACIN <=0.25 SENSITIVE Sensitive     GENTAMICIN >=16 RESISTANT Resistant     IMIPENEM <=0.25 SENSITIVE Sensitive     NITROFURANTOIN <=16 SENSITIVE Sensitive     TRIMETH/SULFA >=320 RESISTANT Resistant     AMPICILLIN/SULBACTAM 8 SENSITIVE Sensitive     PIP/TAZO <=4 SENSITIVE Sensitive     * >=100,000 COLONIES/mL ESCHERICHIA COLI     Time coordinating discharge:  I have spent 35 minutes face to face with the patient and on the ward discussing the  patients care, assessment, plan and disposition with other care givers. >50% of the time was devoted counseling the patient about the risks and benefits of treatment/Discharge disposition and coordinating care.    SIGNED:   Damita Lack, MD  [facility] 10/04/2020, 10:40 AM   If 7PM-7AM, please contact night-coverage

## 2020-10-04 NOTE — Progress Notes (Signed)
Culture came back with ESBL e.coli. D/w Dr. Reesa Chew and we will change her ceftriaxone to cipro '500mg'$  qday x7 for a CrCl<30.  Onnie Boer, PharmD, BCIDP, AAHIVP, CPP Infectious Disease Pharmacist 10/04/2020 9:44 AM

## 2020-10-04 NOTE — Evaluation (Signed)
Occupational Therapy Evaluation Patient Details Name: Melinda Olson MRN: [id number] DOB: 1945-12-25 Today's Date: 10/04/2020    History of Present Illness 75 y.o. 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 Covid +   Clinical Impression   Pt admitted with above and demonstrates the below listed deficits.  She currently requires mod - total A for ADLs and max A for functional transfers.  Per chart, pt lived with family who provided assist, and plan to provide 24 hour assist at discharge.  Pt for discharge today.  Recommend HHOT at discharge. No further acute OT needs identified, will sign off.     Follow Up Recommendations  Home health OT;Supervision/Assistance - 24 hour    Equipment Recommendations  None recommended by OT    Recommendations for Other Services       Precautions / Restrictions Precautions Precautions: Fall      Mobility Bed Mobility Overal bed mobility: Needs Assistance Bed Mobility: Supine to Sit;Sit to Supine;Rolling Rolling: Mod assist   Supine to sit: Mod assist Sit to supine: Mod assist   General bed mobility comments: assist to lift trunk and to lift LEs back to bed    Transfers Overall transfer level: Needs assistance Equipment used: 1 person hand held assist Transfers: Sit to/from Stand Sit to Stand: Max assist         General transfer comment: posterior bias.  Assist to power up into standing and for balance    Balance Overall balance assessment: Needs assistance Sitting-balance support: Feet supported;Single extremity supported Sitting balance-Leahy Scale: Poor Sitting balance - Comments: requires UE support and min guard assist   Standing balance support: Bilateral upper extremity supported Standing balance-Leahy Scale: Zero Standing balance comment: max A                            ADL either performed or assessed with clinical judgement   ADL Overall ADL's : Needs assistance/impaired Eating/Feeding: Minimal assistance;Sitting   Grooming: Wash/dry hands;Wash/dry face;Oral care;Brushing hair;Moderate assistance;Sitting   Upper Body Bathing: Maximal assistance;Sitting   Lower Body Bathing: Sit to/from stand;Maximal assistance   Upper Body Dressing : Maximal assistance;Sitting   Lower Body Dressing: Total assistance;Sit to/from stand   Toilet Transfer: Maximal assistance;Stand-pivot;BSC   Toileting- Clothing Manipulation and Hygiene: Total assistance;Bed level Toileting - Clothing Manipulation Details (indicate cue type and reason): Pt incontinent of stool.  Assisted with peri care     Functional mobility during ADLs: Maximal assistance       Vision Patient Visual Report: No change from baseline       Perception     Praxis Praxis Praxis tested?: Deficits Deficits: Initiation    Pertinent Vitals/Pain       Hand Dominance     Extremity/Trunk Assessment Upper Extremity Assessment Upper Extremity Assessment: Generalized weakness   Lower Extremity Assessment Lower Extremity Assessment: Defer to PT evaluation   Cervical / Trunk Assessment Cervical / Trunk Assessment: Kyphotic   Communication Communication Communication: Expressive difficulties (dyarthric)   Cognition Arousal/Alertness: Awake/alert Behavior During Therapy:  (labile) Overall Cognitive Status: No family/caregiver present to determine baseline cognitive functioning                                 General Comments: Pt labile, but follows simple commands  consistently   General Comments  VSS    Exercises     Shoulder Instructions      Home Living Family/patient expects to be discharged to:: Private residence Living Arrangements: Children Available Help at Discharge: Family;Available 24 hours/day Type of Home: House Home Access: Ramped entrance      Home Layout: One level     Bathroom Shower/Tub: Teacher, early years/pre: Handicapped height Bathroom Accessibility: Yes   Home Equipment: Environmental consultant - 4 wheels;Wheelchair - manual   Additional Comments: information taken from previous note as pt unable to report      Prior Functioning/Environment Level of Independence: Needs assistance  Gait / Transfers Assistance Needed: Pt reports she has been using WC, however, per previous notes, pt was using rollator for ambulation. ADL's / Homemaking Assistance Needed: requries assistance for most ADLs including bathing and dressing per previous notes.            OT Problem List: Decreased strength;Decreased range of motion;Decreased activity tolerance;Impaired balance (sitting and/or standing);Decreased coordination;Decreased cognition      OT Treatment/Interventions:      OT Goals(Current goals can be found in the care plan section) Acute Rehab OT Goals Patient Stated Goal: to go home today OT Goal Formulation: All assessment and education complete, DC therapy  OT Frequency:     Barriers to D/C:            Co-evaluation              AM-PAC OT "6 Clicks" Daily Activity     Outcome Measure Help from another person eating meals?: A Little Help from another person taking care of personal grooming?: A Lot Help from another person toileting, which includes using toliet, bedpan, or urinal?: A Lot Help from another person bathing (including washing, rinsing, drying)?: A Lot Help from another person to put on and taking off regular upper body clothing?: A Lot Help from another person to put on and taking off regular lower body clothing?: Total 6 Click Score: 12   End of Session Nurse Communication: Mobility status  Activity Tolerance: Patient tolerated treatment well Patient left: in bed;with call bell/phone within reach;with bed alarm set  OT Visit Diagnosis: Unsteadiness on feet (R26.81);Cognitive communication  deficit (R41.841) Symptoms and signs involving cognitive functions: Cerebral infarction                Time: [id number] OT Time Calculation (min): 26 min Charges:  OT General Charges $OT Visit: 1 Visit OT Evaluation $OT Eval Moderate Complexity: 1 Mod OT Treatments $Self Care/Home Management : 8-22 mins  Nilsa Nutting., OTR/L Acute Rehabilitation Services Pager [phone] Office [place], [place] 10/04/2020, 2:04 PM

## 2020-10-04 NOTE — TOC Initial Note (Signed)
Transition of Care [facility]) - Initial/Assessment Note    Patient Details  Name: Melinda Olson MRN: [id number] Date of Birth: 04/12/1946  Transition of [facility]) CM/SW Contact:    Benard Halsted, LCSW Phone Number: 10/04/2020, 9:35 AM  Clinical Narrative:                 CSW received SNF consult. CSW spoke with patient's son, [name]. He confirmed that he and his wife have moved in with patient and her husband ever since she had strokes last September. He reported that she receives [facility] with [facility] (confirmed with [facility], Helene Kelp). She has a Music therapist through [employer]. Son and his wife assist with transfers and all other needs. Patient has a wheelchair at home and when she remembers how to walk, she has a gait belt that they use. [name] denied needing a hospital bed or hoyer lift due to there not being any room in the house. He is adamantly refusing SNF placement as patient went to [employer] recently and had a terrible experience. He stated that he would quit his job before placing her at SNF or ALF. Patient has a PCP follow up next week and a rescheduled neurology appointment. He reported he will be able to pick patient up by car at discharge. Nurse can contact him once ready. No other needs identified.  Expected Discharge Plan: [place] Barriers to Discharge: Continued Medical Work up   Patient Goals and CMS Choice Patient states their goals for this hospitalization and ongoing recovery are:: Return home CMS Medicare.gov Compare Post Acute Care list provided to:: Patient Represenative (must comment) Choice offered to / list presented to : Adult Children  Expected Discharge Plan and Services Expected Discharge Plan: [place] In-house Referral: Clinical Social Work Discharge Planning Services: CM Consult Post Acute Care Choice: [place] arrangements for the past 2 months: Single Family Home                 DME Arranged:  N/A DME Agency: NA         [facility]: [facility] [employer] Date [place]: 10/04/20 Time [place]: [phone] Representative spoke with at [place] Arrangements/Services Living arrangements for the past 2 months: [place] with:: Adult Children,Spouse Patient language and need for interpreter reviewed:: Yes Do you feel safe going back to the place where you live?: Yes      Need for Family Participation in Patient Care: Yes (Comment) Care giver support system in place?: No (comment) Current home services: DME,Home PT,Homehealth aide Criminal Activity/Legal Involvement Pertinent to Current Situation/Hospitalization: No - Comment as needed  Activities of Daily Living      Permission Sought/Granted Permission sought to share information with : Facility Contact Representative,Family Supports Permission granted to share information with : No  Share Information with NAME: [name]  Permission granted to share info w AGENCY: [place] granted to share info w Relationship: Son  Permission granted to share info w Contact Information: [phone]  Emotional Assessment   Attitude/Demeanor/Rapport: Unable to Assess Affect (typically observed): Unable to Assess Orientation: : Oriented to Self Alcohol / Substance Use: Not Applicable Psych Involvement: No (comment)  Admission diagnosis:  ARF (acute renal failure) (HCC) [N17.9] AKI (acute kidney injury) ([place]) [N17.9] COVID [U07.1] Patient Active Problem List   Diagnosis Date Noted   ARF (acute renal failure) ([place]) 09/30/2020  COVID-19 virus infection 09/30/2020   Hyperlipidemia 09/30/2020   Normocytic anemia 09/30/2020   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

## 2020-10-04 NOTE — Progress Notes (Signed)
Spoke with [name] (son) on the phone about patients discharge. [name] will bring clothes for patient to be discharged in around 2:15pm.

## 2020-10-15 ENCOUNTER — Other Ambulatory Visit: Payer: Self-pay | Admitting: Neurology

## 2020-10-23 ENCOUNTER — Other Ambulatory Visit (INDEPENDENT_AMBULATORY_CARE_PROVIDER_SITE_OTHER): Payer: Medicare Other | Admitting: Neurology

## 2020-10-23 ENCOUNTER — Other Ambulatory Visit: Payer: Medicare Other

## 2020-10-23 DIAGNOSIS — R410 Disorientation, unspecified: Secondary | ICD-10-CM | POA: Diagnosis not present

## 2020-10-24 ENCOUNTER — Other Ambulatory Visit: Payer: Self-pay | Admitting: Neurology

## 2020-10-24 DIAGNOSIS — F0150 Vascular dementia without behavioral disturbance: Secondary | ICD-10-CM

## 2020-10-29 NOTE — Progress Notes (Signed)
Kindly inform the patient that brainwave study was normal and no seizure activity was noted

## 2020-11-06 ENCOUNTER — Telehealth: Payer: Self-pay | Admitting: *Deleted

## 2020-11-06 NOTE — Telephone Encounter (Signed)
I spoke to the patient's son, [name], and provided him with the EEG results.

## 2020-11-06 NOTE — Telephone Encounter (Signed)
-----   Message from Garvin Fila, MD sent at 10/29/2020  4:11 PM EST ----- Kindly inform the patient that brainwave study was normal and no seizure activity was noted

## 2020-11-19 ENCOUNTER — Other Ambulatory Visit: Payer: Self-pay | Admitting: Nephrology

## 2020-11-19 DIAGNOSIS — N184 Chronic kidney disease, stage 4 (severe): Secondary | ICD-10-CM

## 2020-11-28 ENCOUNTER — Ambulatory Visit
Admission: RE | Admit: 2020-11-28 | Discharge: 2020-11-28 | Disposition: A | Discharge status: Home / Self Care | Payer: Medicare Other | Source: Ambulatory Visit | Attending: Nephrology | Admitting: Nephrology

## 2020-11-28 DIAGNOSIS — N184 Chronic kidney disease, stage 4 (severe): Secondary | ICD-10-CM

## 2020-11-28 IMAGING — US US RENAL
1 series · 14 of 25 positions shown · non-contrast
Comparison: None.

CLINICAL DATA: Chronic renal disease

EXAM:
RENAL / URINARY TRACT ULTRASOUND COMPLETE

[Series 1: us renal · 0.11mm/px · 14 of 34 slices shown]
[im 1/34]
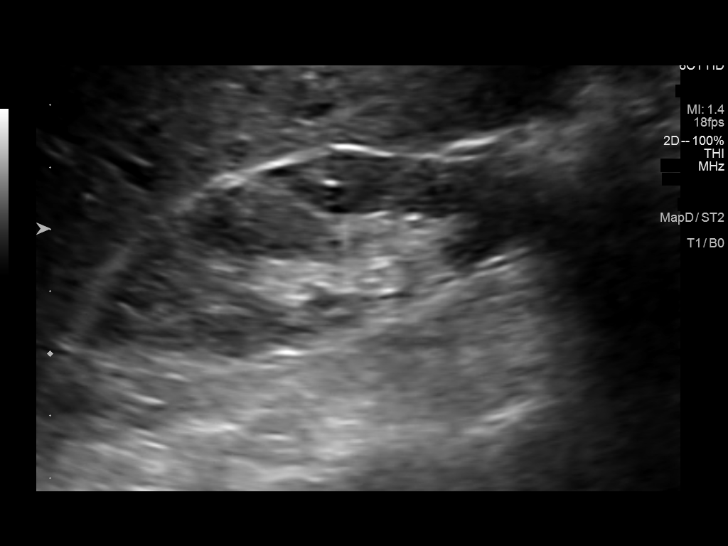
[im 3/34]
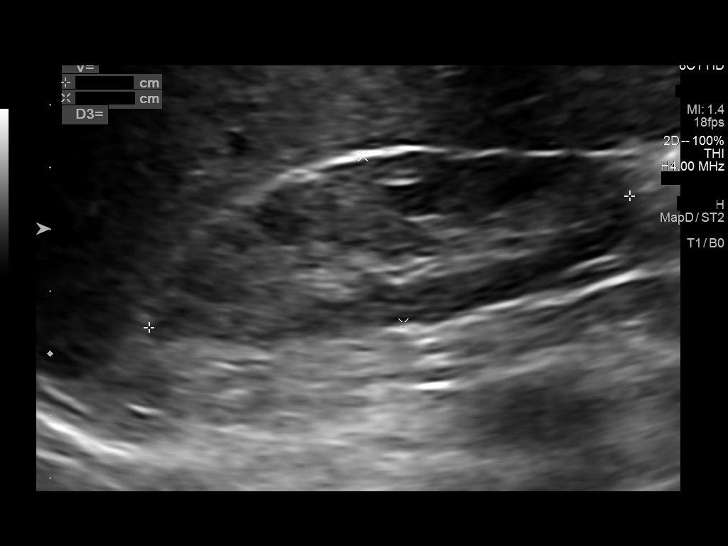
[im 6/34]
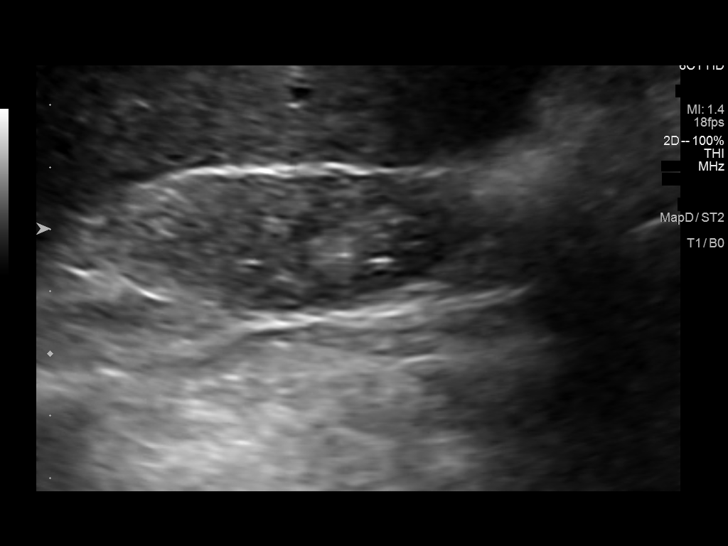
[im 9/34]
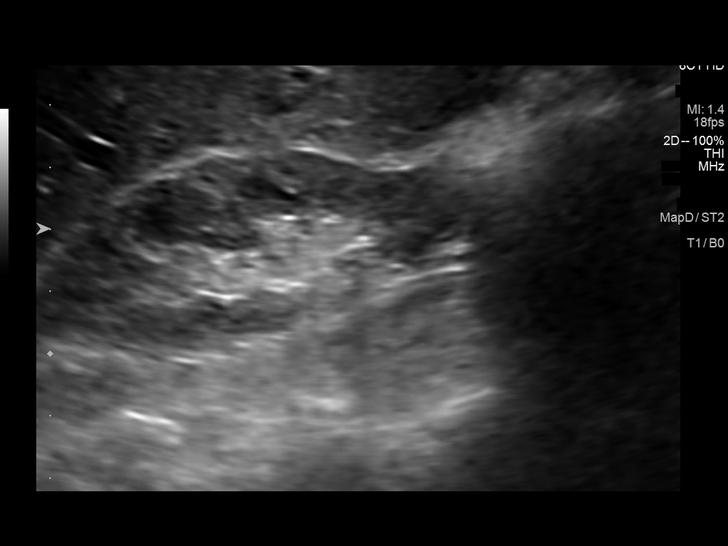
[im 12/34]
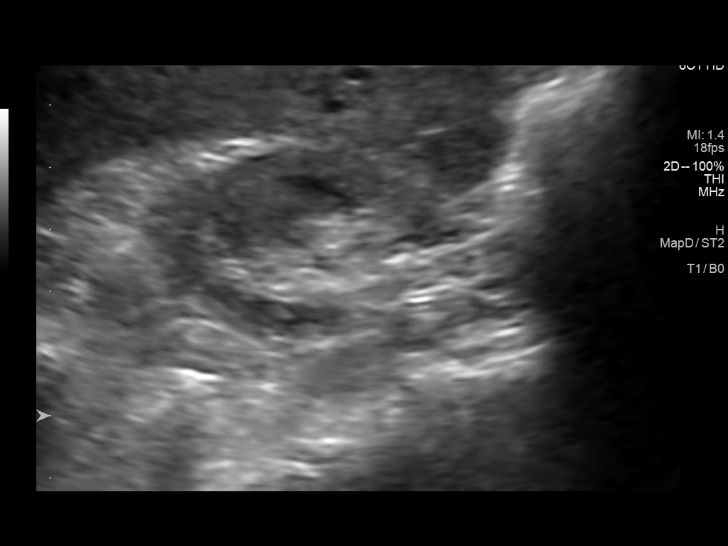
[im 13/34]
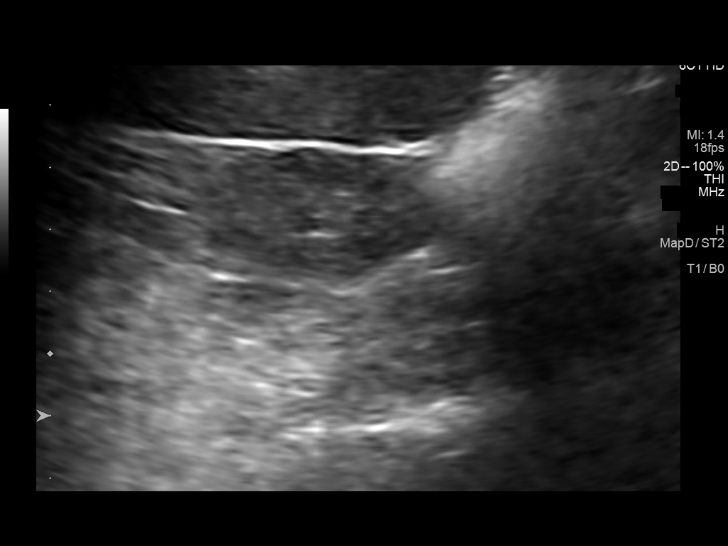
[im 16/34]
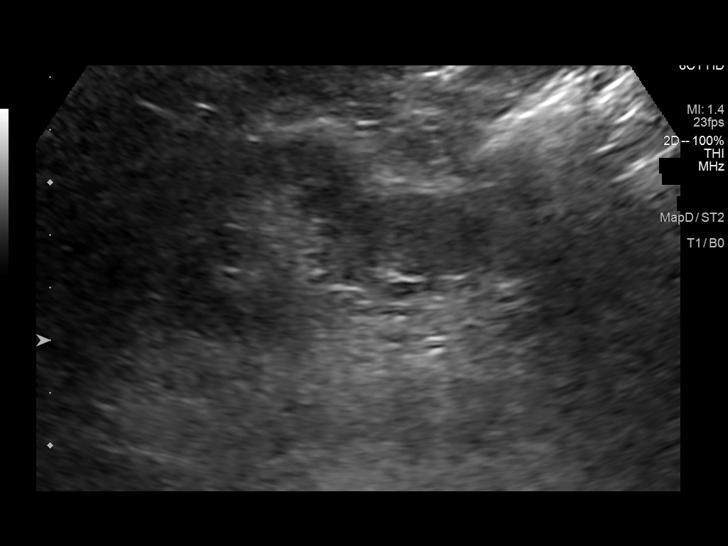
[im 18/34]
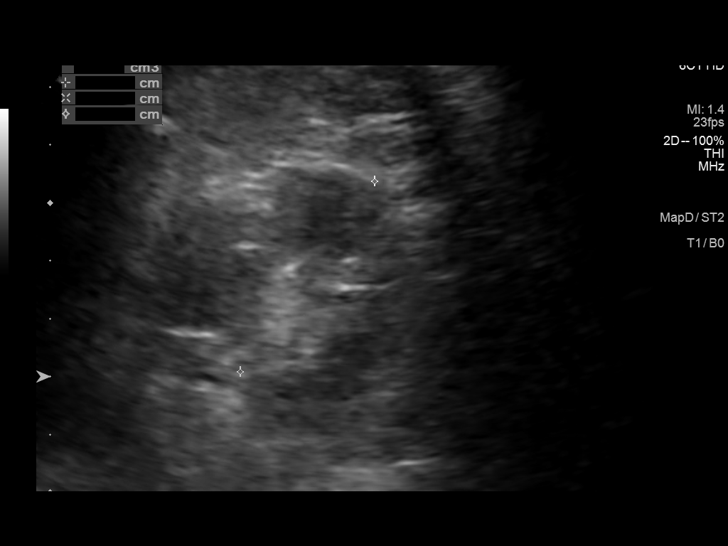
[im 21/34]
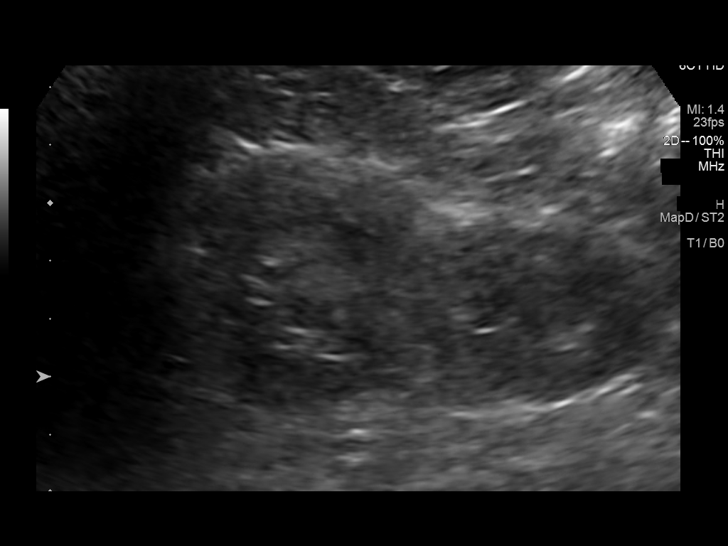
[im 23/34]
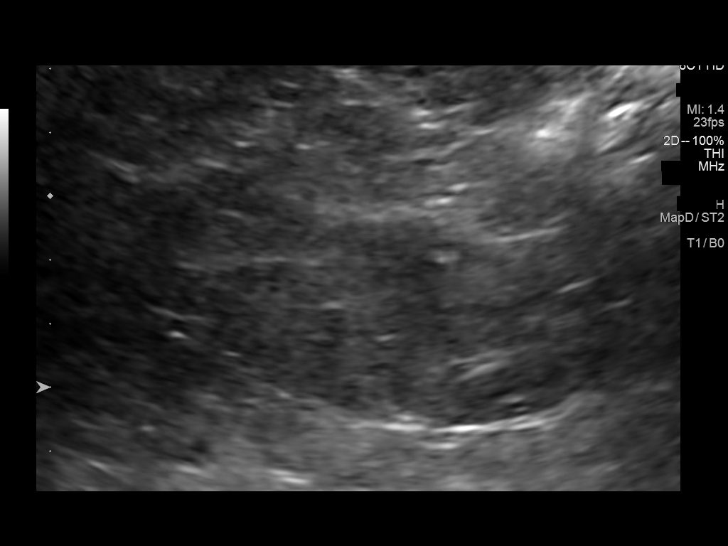
[im 25/34]
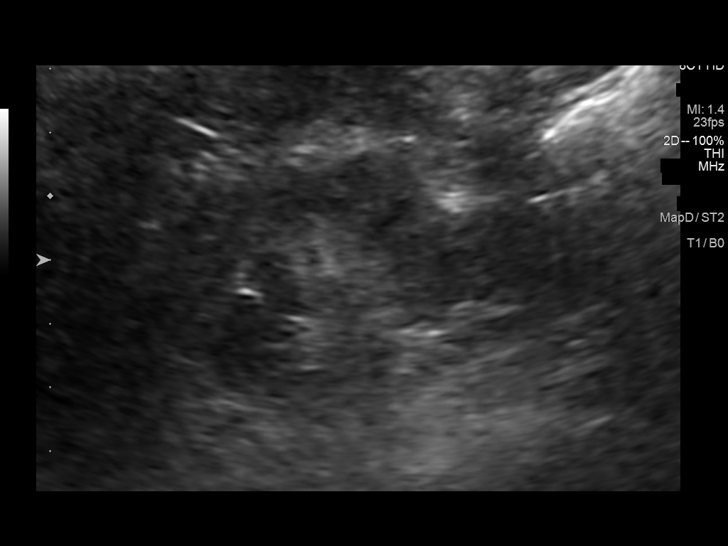
[im 28/34]
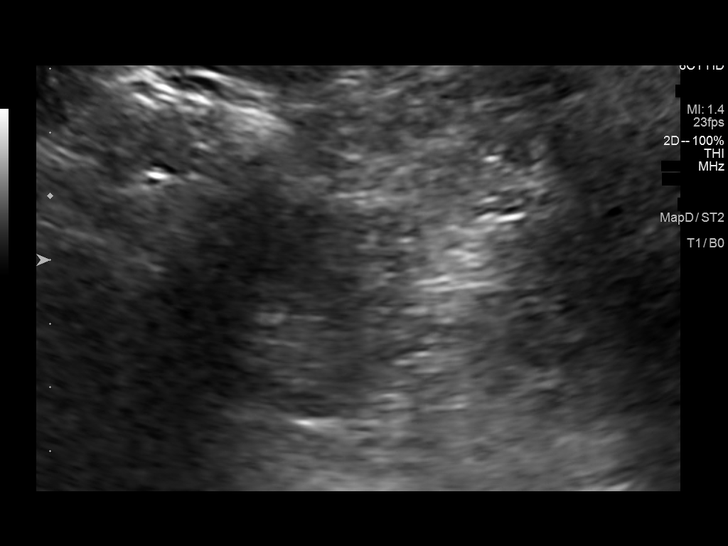
[im 31/34]
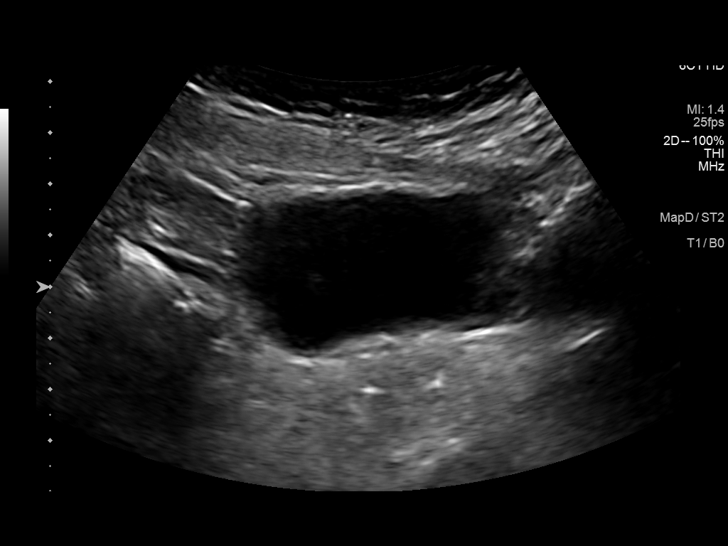
[im 34/34]
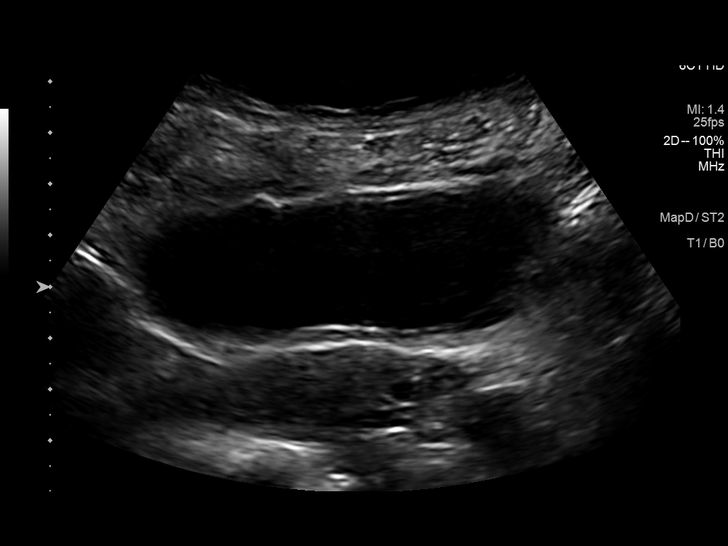

[14 of 25 positions shown; findings below may reference images not displayed]

FINDINGS: Right Kidney:

Renal measurements: 8 x 2.8 x 3.4 cm = volume: 39.2 mL. Increased
cortical echogenicity.

Left Kidney:

Renal measurements: 7.8 x 3.5 x 4.0 cm = volume: 58.1 mL. Increased
cortical echogenicity.

Bladder:

Appears normal for degree of bladder distention.

Other:

None.
IMPRESSION: Increased cortical echogenicity bilaterally. The kidneys are smaller
than normal. The findings are consistent with medical renal disease.

## 2020-12-11 ENCOUNTER — Emergency Department (HOSPITAL_COMMUNITY): Payer: Medicare Other

## 2020-12-11 ENCOUNTER — Inpatient Hospital Stay (HOSPITAL_COMMUNITY): Payer: Medicare Other

## 2020-12-11 ENCOUNTER — Inpatient Hospital Stay (HOSPITAL_BASED_OUTPATIENT_CLINIC_OR_DEPARTMENT_OTHER): Payer: Medicare Other

## 2020-12-11 ENCOUNTER — Other Ambulatory Visit: Payer: Self-pay

## 2020-12-11 ENCOUNTER — Inpatient Hospital Stay (HOSPITAL_COMMUNITY)
Admission: EM | Admit: 2020-12-11 | Discharge: 2020-12-16 | DRG: 291 | Disposition: A | Discharge status: Hospice | Payer: Medicare Other | Attending: Internal Medicine | Admitting: Internal Medicine

## 2020-12-11 ENCOUNTER — Encounter (HOSPITAL_COMMUNITY): Payer: Self-pay

## 2020-12-11 DIAGNOSIS — F0280 Dementia in other diseases classified elsewhere without behavioral disturbance: Secondary | ICD-10-CM | POA: Diagnosis present

## 2020-12-11 DIAGNOSIS — N179 Acute kidney failure, unspecified: Secondary | ICD-10-CM | POA: Diagnosis present

## 2020-12-11 DIAGNOSIS — R7989 Other specified abnormal findings of blood chemistry: Secondary | ICD-10-CM | POA: Diagnosis present

## 2020-12-11 DIAGNOSIS — D649 Anemia, unspecified: Secondary | ICD-10-CM

## 2020-12-11 DIAGNOSIS — Z20822 Contact with and (suspected) exposure to covid-19: Secondary | ICD-10-CM | POA: Diagnosis present

## 2020-12-11 DIAGNOSIS — G929 Unspecified toxic encephalopathy: Secondary | ICD-10-CM | POA: Diagnosis present

## 2020-12-11 DIAGNOSIS — U071 COVID-19: Secondary | ICD-10-CM

## 2020-12-11 DIAGNOSIS — I69354 Hemiplegia and hemiparesis following cerebral infarction affecting left non-dominant side: Secondary | ICD-10-CM

## 2020-12-11 DIAGNOSIS — I5031 Acute diastolic (congestive) heart failure: Secondary | ICD-10-CM

## 2020-12-11 DIAGNOSIS — Z7982 Long term (current) use of aspirin: Secondary | ICD-10-CM

## 2020-12-11 DIAGNOSIS — N184 Chronic kidney disease, stage 4 (severe): Secondary | ICD-10-CM | POA: Diagnosis present

## 2020-12-11 DIAGNOSIS — F0390 Unspecified dementia without behavioral disturbance: Secondary | ICD-10-CM | POA: Diagnosis present

## 2020-12-11 DIAGNOSIS — E785 Hyperlipidemia, unspecified: Secondary | ICD-10-CM | POA: Diagnosis present

## 2020-12-11 DIAGNOSIS — I959 Hypotension, unspecified: Secondary | ICD-10-CM | POA: Diagnosis present

## 2020-12-11 DIAGNOSIS — F0150 Vascular dementia without behavioral disturbance: Secondary | ICD-10-CM | POA: Diagnosis present

## 2020-12-11 DIAGNOSIS — I161 Hypertensive emergency: Secondary | ICD-10-CM | POA: Diagnosis present

## 2020-12-11 DIAGNOSIS — I5033 Acute on chronic diastolic (congestive) heart failure: Secondary | ICD-10-CM | POA: Diagnosis present

## 2020-12-11 DIAGNOSIS — D631 Anemia in chronic kidney disease: Secondary | ICD-10-CM | POA: Diagnosis present

## 2020-12-11 DIAGNOSIS — Z87891 Personal history of nicotine dependence: Secondary | ICD-10-CM | POA: Diagnosis not present

## 2020-12-11 DIAGNOSIS — R778 Other specified abnormalities of plasma proteins: Secondary | ICD-10-CM

## 2020-12-11 DIAGNOSIS — R0602 Shortness of breath: Secondary | ICD-10-CM

## 2020-12-11 DIAGNOSIS — J9601 Acute respiratory failure with hypoxia: Principal | ICD-10-CM | POA: Diagnosis present

## 2020-12-11 DIAGNOSIS — Z66 Do not resuscitate: Secondary | ICD-10-CM | POA: Diagnosis not present

## 2020-12-11 DIAGNOSIS — I130 Hypertensive heart and chronic kidney disease with heart failure and stage 1 through stage 4 chronic kidney disease, or unspecified chronic kidney disease: Principal | ICD-10-CM | POA: Diagnosis present

## 2020-12-11 DIAGNOSIS — Z79899 Other long term (current) drug therapy: Secondary | ICD-10-CM

## 2020-12-11 DIAGNOSIS — I509 Heart failure, unspecified: Secondary | ICD-10-CM

## 2020-12-11 DIAGNOSIS — I160 Hypertensive urgency: Secondary | ICD-10-CM | POA: Diagnosis not present

## 2020-12-11 DIAGNOSIS — G309 Alzheimer's disease, unspecified: Secondary | ICD-10-CM | POA: Diagnosis present

## 2020-12-11 HISTORY — DX: Unspecified dementia, unspecified severity, without behavioral disturbance, psychotic disturbance, mood disturbance, and anxiety: F03.90

## 2020-12-11 HISTORY — DX: Heart failure, unspecified: I50.9

## 2020-12-11 LAB — COMPREHENSIVE METABOLIC PANEL
ALT: 12 U/L (ref 0–44)
ALT: 12 U/L (ref 0–44)
AST: 22 U/L (ref 15–41)
AST: 23 U/L (ref 15–41)
Albumin: 3.2 g/dL — ABNORMAL LOW (ref 3.5–5.0)
Albumin: 3.2 g/dL — ABNORMAL LOW (ref 3.5–5.0)
Alkaline Phosphatase: 70 U/L (ref 38–126)
Alkaline Phosphatase: 74 U/L (ref 38–126)
Anion gap: 12 (ref 5–15)
Anion gap: 12 (ref 5–15)
BUN: 54 mg/dL — ABNORMAL HIGH (ref 8–23)
BUN: 61 mg/dL — ABNORMAL HIGH (ref 8–23)
CO2: 20 mmol/L — ABNORMAL LOW (ref 22–32)
CO2: 18 mmol/L — ABNORMAL LOW (ref 22–32)
Calcium: 8.4 mg/dL — ABNORMAL LOW (ref 8.9–10.3)
Calcium: 8.6 mg/dL — ABNORMAL LOW (ref 8.9–10.3)
Chloride: 108 mmol/L (ref 98–111)
Chloride: 109 mmol/L (ref 98–111)
Creatinine, Ser: 2.78 mg/dL — ABNORMAL HIGH (ref 0.44–1.00)
Creatinine, Ser: 3.07 mg/dL — ABNORMAL HIGH (ref 0.44–1.00)
GFR, Estimated: 17 mL/min — ABNORMAL LOW (ref >60)
GFR, Estimated: 15 mL/min — ABNORMAL LOW (ref >60)
Glucose, Bld: 185 mg/dL — ABNORMAL HIGH (ref 70–99)
Glucose, Bld: 250 mg/dL — ABNORMAL HIGH (ref 70–99)
Potassium: 3.6 mmol/L (ref 3.5–5.1)
Potassium: 3.6 mmol/L (ref 3.5–5.1)
Sodium: 140 mmol/L (ref 135–145)
Sodium: 139 mmol/L (ref 135–145)
Total Bilirubin: 0.5 mg/dL (ref 0.3–1.2)
Total Bilirubin: 0.6 mg/dL (ref 0.3–1.2)
Total Protein: 6 g/dL — ABNORMAL LOW (ref 6.5–8.1)
Total Protein: 6.2 g/dL — ABNORMAL LOW (ref 6.5–8.1)

## 2020-12-11 LAB — PROCALCITONIN: Procalcitonin: 0.79 ng/mL

## 2020-12-11 LAB — ECHOCARDIOGRAM COMPLETE
AR max vel: 2.28 cm2
AV Area VTI: 2.2 cm2
AV Area mean vel: 2.19 cm2
AV Mean grad: 5 mmHg
AV Peak grad: 7.4 mmHg
Ao pk vel: 1.36 m/s
Area-P 1/2: 5.54 cm2
Calc EF: 47.7 %
Height: 60 in
MV M vel: 6.02 m/s
MV Peak grad: 145 mmHg
P 1/2 time: 231 msec
Radius: 0.6 cm
S' Lateral: 3 cm
Single Plane A2C EF: 49.7 %
Single Plane A4C EF: 42.7 %
Weight: 1728.41 oz

## 2020-12-11 LAB — CBC WITH DIFFERENTIAL/PLATELET
Abs Immature Granulocytes: 0.06 10*3/uL (ref 0.00–0.07)
Abs Immature Granulocytes: 0.04 10*3/uL (ref 0.00–0.07)
Basophils Absolute: 0.1 10*3/uL (ref 0.0–0.1)
Basophils Absolute: 0 10*3/uL (ref 0.0–0.1)
Basophils Relative: 1 %
Basophils Relative: 0 %
Eosinophils Absolute: 0.6 10*3/uL — ABNORMAL HIGH (ref 0.0–0.5)
Eosinophils Absolute: 0 10*3/uL (ref 0.0–0.5)
Eosinophils Relative: 4 %
Eosinophils Relative: 0 %
HCT: 30 % — ABNORMAL LOW (ref 36.0–46.0)
HCT: 28.5 % — ABNORMAL LOW (ref 36.0–46.0)
Hemoglobin: 9.3 g/dL — ABNORMAL LOW (ref 12.0–15.0)
Hemoglobin: 9 g/dL — ABNORMAL LOW (ref 12.0–15.0)
Immature Granulocytes: 0 %
Immature Granulocytes: 0 %
Lymphocytes Relative: 35 %
Lymphocytes Relative: 4 %
Lymphs Abs: 5.8 10*3/uL — ABNORMAL HIGH (ref 0.7–4.0)
Lymphs Abs: 0.4 10*3/uL — ABNORMAL LOW (ref 0.7–4.0)
MCH: 30.5 pg (ref 26.0–34.0)
MCH: 30.6 pg (ref 26.0–34.0)
MCHC: 31 g/dL (ref 30.0–36.0)
MCHC: 31.6 g/dL (ref 30.0–36.0)
MCV: 98.4 fL (ref 80.0–100.0)
MCV: 96.9 fL (ref 80.0–100.0)
Monocytes Absolute: 0.8 10*3/uL (ref 0.1–1.0)
Monocytes Absolute: 0.1 10*3/uL (ref 0.1–1.0)
Monocytes Relative: 5 %
Monocytes Relative: 1 %
Neutro Abs: 9.5 10*3/uL — ABNORMAL HIGH (ref 1.7–7.7)
Neutro Abs: 10.4 10*3/uL — ABNORMAL HIGH (ref 1.7–7.7)
Neutrophils Relative %: 55 %
Neutrophils Relative %: 95 %
Platelets: 267 10*3/uL (ref 150–400)
Platelets: 245 10*3/uL (ref 150–400)
RBC: 3.05 MIL/uL — ABNORMAL LOW (ref 3.87–5.11)
RBC: 2.94 MIL/uL — ABNORMAL LOW (ref 3.87–5.11)
RDW: 13.6 % (ref 11.5–15.5)
RDW: 13.6 % (ref 11.5–15.5)
WBC: 16.9 10*3/uL — ABNORMAL HIGH (ref 4.0–10.5)
WBC: 11 10*3/uL — ABNORMAL HIGH (ref 4.0–10.5)
nRBC: 0 % (ref 0.0–0.2)
nRBC: 0 % (ref 0.0–0.2)

## 2020-12-11 LAB — OSMOLALITY, URINE: Osmolality, Ur: 239 mOsm/kg — ABNORMAL LOW (ref 300–900)

## 2020-12-11 LAB — URINALYSIS, ROUTINE W REFLEX MICROSCOPIC
Bacteria, UA: NONE SEEN
Bilirubin Urine: NEGATIVE
Glucose, UA: 50 mg/dL — AB
Hgb urine dipstick: NEGATIVE
Ketones, ur: NEGATIVE mg/dL
Leukocytes,Ua: NEGATIVE
Nitrite: NEGATIVE
Protein, ur: 30 mg/dL — AB
Specific Gravity, Urine: 1.006 (ref 1.005–1.030)
pH: 5 (ref 5.0–8.0)

## 2020-12-11 LAB — TROPONIN I (HIGH SENSITIVITY)
Troponin I (High Sensitivity): 94 ng/L — ABNORMAL HIGH (ref <18)
Troponin I (High Sensitivity): 166 ng/L (ref <18)
Troponin I (High Sensitivity): 201 ng/L (ref <18)
Troponin I (High Sensitivity): 141 ng/L (ref <18)

## 2020-12-11 LAB — BRAIN NATRIURETIC PEPTIDE: B Natriuretic Peptide: 1884.3 pg/mL — ABNORMAL HIGH (ref 0.0–100.0)

## 2020-12-11 LAB — TSH: TSH: 5.905 u[IU]/mL — ABNORMAL HIGH (ref 0.350–4.500)

## 2020-12-11 LAB — OSMOLALITY: Osmolality: 325 mOsm/kg (ref 275–295)

## 2020-12-11 LAB — CREATININE, URINE, RANDOM: Creatinine, Urine: 31.71 mg/dL

## 2020-12-11 LAB — HEPARIN LEVEL (UNFRACTIONATED)
Heparin Unfractionated: 0.24 IU/mL — ABNORMAL LOW (ref 0.30–0.70)
Heparin Unfractionated: 0.11 IU/mL — ABNORMAL LOW (ref 0.30–0.70)

## 2020-12-11 LAB — SODIUM, URINE, RANDOM: Sodium, Ur: 39 mmol/L

## 2020-12-11 LAB — D-DIMER, QUANTITATIVE: D-Dimer, Quant: 4.65 ug/mL-FEU — ABNORMAL HIGH (ref 0.00–0.50)

## 2020-12-11 LAB — MAGNESIUM: Magnesium: 2.5 mg/dL — ABNORMAL HIGH (ref 1.7–2.4)

## 2020-12-11 LAB — SARS CORONAVIRUS 2 (TAT 6-24 HRS): SARS Coronavirus 2: NEGATIVE

## 2020-12-11 IMAGING — DX DG CHEST 1V PORT
1 series · 1 of 1 positions shown · non-contrast
Comparison: [DATE]

CLINICAL DATA: Dyspnea

EXAM:
PORTABLE CHEST 1 VIEW

[chest ap]
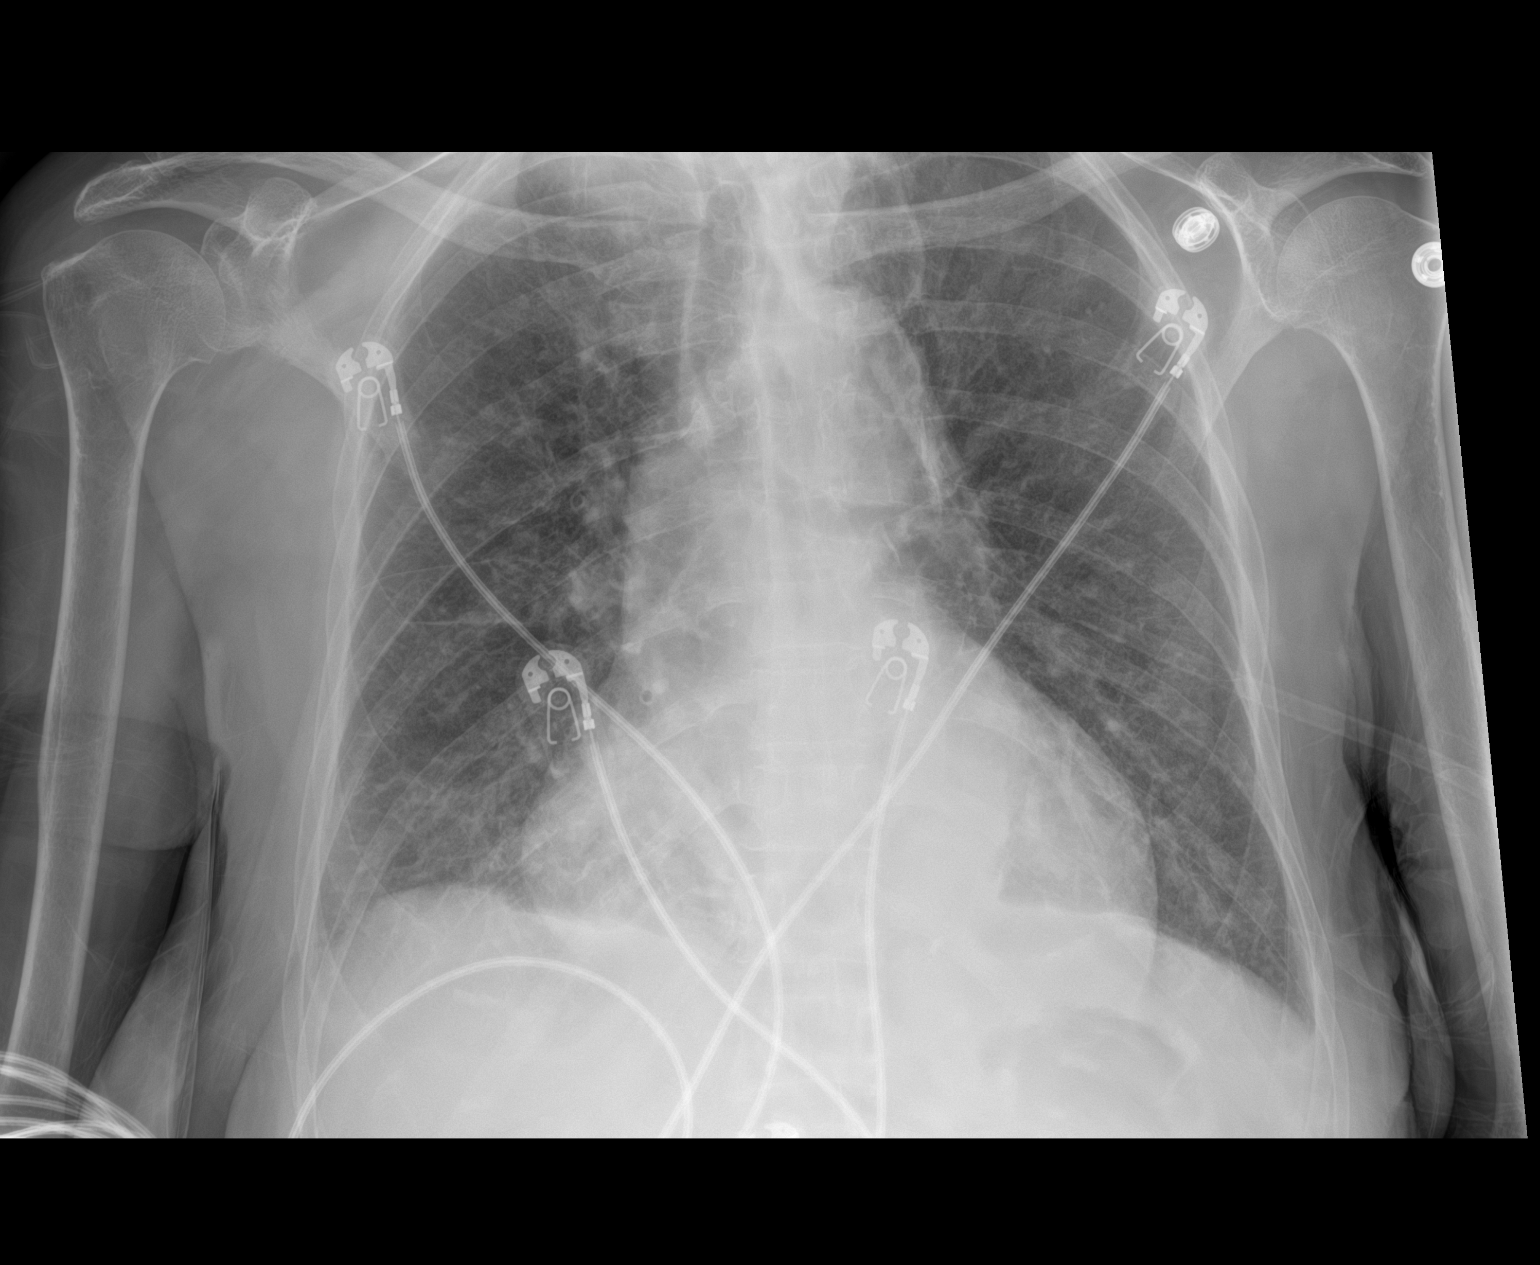

[1 of 1 positions shown; findings below may reference images not displayed]

FINDINGS: The lungs are symmetrically well expanded. Since the prior
examination, there has developed trace diffuse interstitial
pulmonary infiltrate, most in keeping with trace pulmonary edema,
likely cardiogenic in nature. Cardiac size is mildly enlarged. No
pneumothorax or pleural effusion. No acute bone abnormality.
IMPRESSION: Interval development of mild pulmonary edema, likely cardiogenic in
nature.

## 2020-12-11 IMAGING — NM NM PULMONARY VENT & PERF
8 series · 8 of 8 positions shown · non-contrast
Comparison: Chest radiograph [DATE]

CLINICAL DATA: Dyspnea with elevated D-dimer

EXAM:
NUCLEAR MEDICINE PERFUSION LUNG SCAN
TECHNIQUE: Perfusion images were obtained in multiple projections after
intravenous injection of radiopharmaceutical.
Views: Anterior, posterior, left lateral, right lateral, RPO, LPO,
LAO, RAO
RADIOPHARMACEUTICALS:  4.15 mCi [DD] MAA IV

[Series 1: ant/post perf · 4.14mm/px · 1 of 1 slices shown (1 of 2)]
[im 1/1]
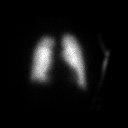

[Series 1: ant/post perf · 4.14mm/px · 1 of 1 slices shown (2 of 2)]
[im 1/1]
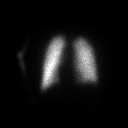

[Series 2: lao/rpo perf · 4.14mm/px · 1 of 1 slices shown (1 of 2)]
[im 1/1]
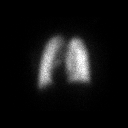

[Series 2: lao/rpo perf · 4.14mm/px · 1 of 1 slices shown (2 of 2)]
[im 1/1]
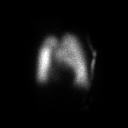

[Series 3: lpo/rao perf · 4.14mm/px · 1 of 1 slices shown (1 of 2)]
[im 1/1]
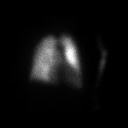

[Series 3: lpo/rao perf · 4.14mm/px · 1 of 1 slices shown (2 of 2)]
[im 1/1]
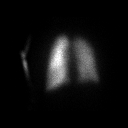

[Series 4: lt lat/rt lat perf · 4.14mm/px · 1 of 1 slices shown (1 of 2)]
[im 1/1]
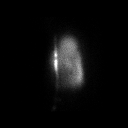

[Series 4: lt lat/rt lat perf · 4.14mm/px · 1 of 1 slices shown (2 of 2)]
[im 1/1]
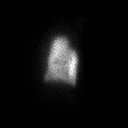

[8 of 8 positions shown; findings below may reference images not displayed]

FINDINGS: Radiotracer uptake bilaterally is homogeneous and symmetric. No
perfusion defects evident.
IMPRESSION: No perfusion defects evident on either side. No findings indicative
of pulmonary embolus.

## 2020-12-11 MED ORDER — PANTOPRAZOLE SODIUM 40 MG PO TBEC
40.0000 mg | DELAYED_RELEASE_TABLET | Freq: Every day | ORAL | Status: DC
  Administered 2020-12-11: 40 mg via ORAL
  Filled 2020-12-11: qty 1

## 2020-12-11 MED ORDER — ACETAMINOPHEN 325 MG PO TABS
650.0000 mg | ORAL_TABLET | Freq: Four times a day (QID) | ORAL | Status: DC | PRN
  Administered 2020-12-11 – 2020-12-12 (×2): 650 mg via ORAL
  Filled 2020-12-11 (×3): qty 2

## 2020-12-11 MED ORDER — SENNOSIDES-DOCUSATE SODIUM 8.6-50 MG PO TABS
1.0000 | ORAL_TABLET | Freq: Every day | ORAL | Status: DC
  Administered 2020-12-11 – 2020-12-15 (×5): 1 via ORAL
  Filled 2020-12-11 (×5): qty 1

## 2020-12-11 MED ORDER — NITROGLYCERIN 2 % TD OINT
1.0000 [in_us] | TOPICAL_OINTMENT | Freq: Once | TRANSDERMAL | Status: AC
  Administered 2020-12-11: 1 [in_us] via TOPICAL
  Filled 2020-12-11: qty 1

## 2020-12-11 MED ORDER — HEPARIN (PORCINE) 25000 UT/250ML-% IV SOLN
600.0000 [IU]/h | INTRAVENOUS | Status: DC
  Administered 2020-12-11: 600 [IU]/h via INTRAVENOUS
  Filled 2020-12-11: qty 250

## 2020-12-11 MED ORDER — ISOSORBIDE MONONITRATE ER 30 MG PO TB24
30.0000 mg | ORAL_TABLET | Freq: Every day | ORAL | Status: DC
  Administered 2020-12-12 – 2020-12-13 (×2): 30 mg via ORAL
  Filled 2020-12-11 (×2): qty 1

## 2020-12-11 MED ORDER — MEMANTINE HCL 10 MG PO TABS
10.0000 mg | ORAL_TABLET | Freq: Two times a day (BID) | ORAL | Status: DC
  Administered 2020-12-11 – 2020-12-16 (×11): 10 mg via ORAL
  Filled 2020-12-11 (×13): qty 1

## 2020-12-11 MED ORDER — THIAMINE HCL 100 MG PO TABS
100.0000 mg | ORAL_TABLET | Freq: Every day | ORAL | Status: DC
  Administered 2020-12-11 – 2020-12-16 (×6): 100 mg via ORAL
  Filled 2020-12-11 (×6): qty 1

## 2020-12-11 MED ORDER — ISOSORBIDE MONONITRATE ER 30 MG PO TB24: 30.0000 mg | ORAL_TABLET | Freq: Every day | ORAL | Status: DC

## 2020-12-11 MED ORDER — HEPARIN (PORCINE) 25000 UT/250ML-% IV SOLN
900.0000 [IU]/h | INTRAVENOUS | Status: DC
  Administered 2020-12-11: 700 [IU]/h via INTRAVENOUS
  Administered 2020-12-12: 800 [IU]/h via INTRAVENOUS
  Filled 2020-12-11: qty 250

## 2020-12-11 MED ORDER — FOLIC ACID 1 MG PO TABS
1.0000 mg | ORAL_TABLET | Freq: Every day | ORAL | Status: DC
  Administered 2020-12-11 – 2020-12-16 (×6): 1 mg via ORAL
  Filled 2020-12-11 (×6): qty 1

## 2020-12-11 MED ORDER — NITROGLYCERIN IN D5W 200-5 MCG/ML-% IV SOLN
0.0000 ug/min | INTRAVENOUS | Status: DC
  Administered 2020-12-11: 50 ug/min via INTRAVENOUS
  Filled 2020-12-11: qty 250

## 2020-12-11 MED ORDER — FUROSEMIDE 10 MG/ML IJ SOLN
40.0000 mg | Freq: Once | INTRAMUSCULAR | Status: AC
  Administered 2020-12-11: 40 mg via INTRAVENOUS
  Filled 2020-12-11: qty 4

## 2020-12-11 MED ORDER — VITAMIN B-12 1000 MCG PO TABS
1000.0000 ug | ORAL_TABLET | Freq: Every day | ORAL | Status: DC
  Administered 2020-12-11 – 2020-12-16 (×6): 1000 ug via ORAL
  Filled 2020-12-11 (×6): qty 1

## 2020-12-11 MED ORDER — HYDRALAZINE HCL 20 MG/ML IJ SOLN: 10.0000 mg | INTRAMUSCULAR | Status: DC | PRN

## 2020-12-11 MED ORDER — METOPROLOL TARTRATE 25 MG PO TABS
25.0000 mg | ORAL_TABLET | Freq: Two times a day (BID) | ORAL | Status: DC
  Administered 2020-12-11 – 2020-12-13 (×5): 25 mg via ORAL
  Filled 2020-12-11 (×5): qty 1

## 2020-12-11 MED ORDER — ACETAMINOPHEN 650 MG RE SUPP: 650.0000 mg | Freq: Four times a day (QID) | RECTAL | Status: DC | PRN

## 2020-12-11 MED ORDER — FUROSEMIDE 10 MG/ML IJ SOLN
20.0000 mg | Freq: Once | INTRAMUSCULAR | Status: AC
  Administered 2020-12-11: 20 mg via INTRAVENOUS
  Filled 2020-12-11: qty 2

## 2020-12-11 MED ORDER — HEPARIN BOLUS VIA INFUSION
3000.0000 [IU] | Freq: Once | INTRAVENOUS | Status: AC
  Administered 2020-12-11: 3000 [IU] via INTRAVENOUS
  Filled 2020-12-11: qty 3000

## 2020-12-11 MED ORDER — TECHNETIUM TO 99M ALBUMIN AGGREGATED
4.1500 | Freq: Once | INTRAVENOUS | Status: AC | PRN
  Administered 2020-12-11: 4.15 via INTRAVENOUS

## 2020-12-11 MED ORDER — ATORVASTATIN CALCIUM 40 MG PO TABS
40.0000 mg | ORAL_TABLET | Freq: Every day | ORAL | Status: DC
  Administered 2020-12-11 – 2020-12-16 (×6): 40 mg via ORAL
  Filled 2020-12-11 (×6): qty 1

## 2020-12-11 MED ORDER — ASPIRIN EC 81 MG PO TBEC
81.0000 mg | DELAYED_RELEASE_TABLET | Freq: Every day | ORAL | Status: DC
  Administered 2020-12-11: 81 mg via ORAL
  Filled 2020-12-11: qty 1

## 2020-12-11 NOTE — ED Notes (Signed)
Lab called troponin 166. Dr. Hal Hope made aware.

## 2020-12-11 NOTE — Progress Notes (Addendum)
PROGRESS NOTE                                                                                                                                                                                                             Patient Demographics:    Melinda Olson, is a 75 y.o. female, DOB - 20-Dec-1945, [id number]  Outpatient Primary MD for the patient is [place], [place], [place]    LOS - 0  Admit date - 12/11/2020    Chief Complaint  Patient presents with  . Shortness of Breath       Brief Narrative (HPI from H&P) - Melinda Olson is a 75 y.o. female with history of CVA with left-sided hemiparesis, dementia, chronic kidney disease stage IV last creatinine was around 1.6 in January 2022 when patient was admitted for Covid at that time was found to be acutely short of breath around midnight, she had seen her nephrologist 2 weeks ago for worsening renal function her at that time her blood pressure was on the lower side and her home Norvasc was stopped.  She had no chest pain in the ER but had significant shortness of breath and orthopnea she was admitted for CHF treatment.   Subjective:    [name] today has, No headache, No chest pain, No abdominal pain - No Nausea, No new weakness tingling or numbness, much improved SOB   Assessment  & Plan :     1. Acute Hypoxic Resp. 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  Will monitor closely    2.  Mild non-ACS pattern troponin elevation.  Likely due to in the setting of AKI on CKD 4.  Chest pain-free nonacute EKG, on aspirin statin and beta-blocker, monitor.  3.  AKI on CKD 4.  Baseline creatinine between 1.7-2 - noted recent  renal ultrasound showing medical disease, nephrology has been consulted for fluid management and monitoring of renal function.  4.  Hypertensive emergency.  Much improved could have been high due to shortness of breath, low-dose beta-blocker and monitor.  5.  Dyslipidemia.  On statin.  6.  History of dementia.  On memantine.  Monitor.  At risk for delirium.  Minimize narcotics and benzodiazepines.  7.  Elevated D-dimer.  V/Q and leg ultrasound pending, currently on heparin drip.  8. History of CVA with left-sided hemiplegia - on statins and aspirin.       Condition - Extremely Guarded  Family Communication  :  Son [name] [phone] message left 12/11/20 at 8.36 am  Code Status : Full  Consults  :  Renal  PUD Prophylaxis :    Procedures  :     VQ  TTE  Leg [place]      Disposition Plan  :    Status is: Inpatient  Remains inpatient appropriate because:IV treatments appropriate due to intensity of illness or inability to take PO   Dispo: The patient is from: Home              Anticipated d/c is to: Home              Patient currently is not medically stable to d/c.   Difficult to place patient No  DVT Prophylaxis  :   Heparin gtt  Lab Results  Component Value Date   PLT 245 12/11/2020    Diet :  Diet Order            Diet Heart Room service appropriate? Yes; Fluid consistency: Thin; Fluid restriction: 1200 mL Fluid  Diet effective now                  Inpatient Medications  Scheduled Meds: . aspirin EC  81 mg Oral Daily  . atorvastatin  40 mg Oral Daily  . folic acid  1 mg Oral Daily  . [START ON 12/12/2020] isosorbide mononitrate  30 mg Oral Daily  . memantine  10 mg Oral BID  . metoprolol tartrate  25 mg Oral BID  . senna-docusate  1 tablet Oral QHS  . thiamine  100 mg Oral Daily  . cyanocobalamin  1,000 mcg Oral Daily   Continuous Infusions: . heparin 600 Units/hr (12/11/20 0254)   PRN Meds:.acetaminophen **OR** acetaminophen,  hydrALAZINE  Antibiotics  :    Anti-infectives (From admission, onward)   None       Time Spent in minutes  30   Lala Lund M.D on 12/11/2020 at 8:25 AM  To page go to [email or web address]   [facility] -  Office  [phone]    See all Orders from today for further details    Objective:   Vitals:   12/11/20 0043 12/11/20 0115 12/11/20 0408 12/11/20 0800  BP:  (!) 205/87 (!) 171/86 112/88  Pulse:  (!) 118 (!) 115 (!) 119  Resp:  (!) '24 18 16  '$ Temp:      TempSrc:      SpO2:  100% 100% 100%  Weight: 49 kg     Height: 5' (1.524 m)       Wt Readings from Last 3 Encounters:  12/11/20 49 kg  09/23/20 48.8 kg  05/14/20 79.4 kg    No intake or output data in the 24 hours ending 12/11/20 0825   Physical Exam  Awake but confused, chronic left-sided weakness, anxious affect [place].AT,PERRAL Supple Neck,No JVD, No cervical lymphadenopathy appriciated.  Symmetrical Chest wall movement, Good air movement bilaterally, few rales RRR,No Gallops,Rubs or new Murmurs, No Parasternal Heave +ve B.Sounds, Abd Soft, No tenderness, No organomegaly appriciated, No rebound - guarding or rigidity. No Cyanosis, Clubbing or edema, No new  Rash or bruise       Data Review:    CBC Recent Labs  Lab 12/11/20 0115 12/11/20 0531  WBC 16.9* 11.0*  HGB 9.3* 9.0*  HCT 30.0* 28.5*  PLT 267 245  MCV 98.4 96.9  MCH 30.5 30.6  MCHC 31.0 31.6  RDW 13.6 13.6  LYMPHSABS 5.8* 0.4*  MONOABS 0.8 0.1  EOSABS 0.6* 0.0  BASOSABS 0.1 0.0    Recent Labs  Lab 12/11/20 0115 12/11/20 0531  NA 140 139  K 3.6 3.6  CL 108 109  CO2 20* 18*  GLUCOSE 185* 250*  BUN 54* 61*  CREATININE 2.78* 3.07*  CALCIUM 8.4* 8.6*  AST 22 23  ALT 12 12  ALKPHOS 70 74  BILITOT 0.5 0.6  ALBUMIN 3.2* 3.2*  MG  --  2.5*  DDIMER 4.65*  --   TSH  --  5.905*  BNP 1,884.3*  --     ------------------------------------------------------------------------------------------------------------------ No  results for input(s): CHOL, HDL, LDLCALC, TRIG, CHOLHDL, LDLDIRECT in the last 72 hours.  Lab Results  Component Value Date   HGBA1C 6.6 (H) 05/15/2020   ------------------------------------------------------------------------------------------------------------------   Micro Results No results found for this or any previous visit (from the past 240 hour(s)).  Radiology Reports US RENAL  Result Date: 11/30/2020 CLINICAL DATA:  Chronic renal disease EXAM: RENAL / URINARY TRACT ULTRASOUND COMPLETE COMPARISON:  None. FINDINGS: Right Kidney: Renal measurements: 8 x 2.8 x 3.4 cm = volume: 39.2 mL. Increased cortical echogenicity. Left Kidney: Renal measurements: 7.8 x 3.5 x 4.0 cm = volume: 58.1 mL. Increased cortical echogenicity. Bladder: Appears normal for degree of bladder distention. Other: None. IMPRESSION: Increased cortical echogenicity bilaterally. The kidneys are smaller than normal. The findings are consistent with medical renal disease. Electronically Signed   By: Dorise Bullion III M.D   On: 11/30/2020 21:15   DG Chest Port 1 View  Result Date: 12/11/2020 CLINICAL DATA:  Dyspnea EXAM: PORTABLE CHEST 1 VIEW COMPARISON:  09/30/2020 FINDINGS: The lungs are symmetrically well expanded. Since the prior examination, there has developed trace diffuse interstitial pulmonary infiltrate, most in keeping with trace pulmonary edema, likely cardiogenic in nature. Cardiac size is mildly enlarged. No pneumothorax or pleural effusion. No acute bone abnormality. IMPRESSION: Interval development of mild pulmonary edema, likely cardiogenic in nature. Electronically Signed   By: Fidela Salisbury MD   On: 12/11/2020 01:02

## 2020-12-11 NOTE — ED Notes (Signed)
Report attempted 

## 2020-12-11 NOTE — CV Procedure (Signed)
BLE venous duplex completed.   Results can be found under chart review under CV PROC. 12/11/2020 1:13 PM Delila Kuklinski RVT, RDMS

## 2020-12-11 NOTE — ED Notes (Signed)
Pt transported to nuc med  

## 2020-12-11 NOTE — ED Provider Notes (Signed)
[facility] DEPARTMENT Provider Note   CSN: [id number] Arrival date & time: 12/11/20  0032   History Chief Complaint  Patient presents with  . Shortness of Breath    Melinda Olson is a 75 y.o. female.  The history is provided by the patient and the EMS personnel. The history is limited by the condition of the patient (Dementia).  Shortness of Breath She has history of hypertension, hyperlipidemia, stroke, dementia and was brought in by ambulance because of difficulty breathing.  Apparently, family went to check on her at about midnight and found that she was having shortness of breath.  Patient denies chest pain, heaviness, tightness, pressure.  She was transported by EMS who treated her with intravenous magnesium and methylprednisolone as well as albuterol and ipratropium via nebulizer.  Past Medical History:  Diagnosis Date  . Hypertension   . Stroke [facility]) 05/13/2020    Patient Active Problem List   Diagnosis Date Noted  . ARF (acute renal failure) ([place]) 09/30/2020  . COVID-19 virus infection 09/30/2020  . Hyperlipidemia 09/30/2020  . Normocytic anemia 09/30/2020  . 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 B12 deficiency 09/23/2020  . Acute CVA (cerebrovascular accident) ([place]) 05/15/2020  . AMS (altered mental status) 05/14/2020  . UTI (urinary tract infection) 05/14/2020  . Leucocytosis 05/14/2020  . AKI (acute kidney injury) ([place]) 05/14/2020    History reviewed. No pertinent surgical history.   OB History   No obstetric history on file.     History reviewed. No pertinent family history.  Social History   Tobacco Use  . Smoking status: Former Smoker    Types: Cigarettes    Quit date: 05/13/2020    Years since quitting: 0.5  . Smokeless tobacco: Never Used  Vaping Use  . Vaping Use: Never used  Substance Use Topics  . Alcohol use: Never  . Drug use: Never    Home Medications Prior to Admission medications   Medication Sig Start  Date End Date Taking? Authorizing Provider  amLODipine (NORVASC) 5 MG tablet Take 1 tablet (5 mg total) by mouth daily. 05/18/20 05/18/21  Damita Lack, MD  aspirin EC 81 MG EC tablet Take 1 tablet (81 mg total) by mouth daily. Swallow whole. 05/18/20   Amin, Jeanella Flattery, MD  atorvastatin (LIPITOR) 40 MG tablet Take 1 tablet (40 mg total) by mouth daily. 05/18/20   Amin, Jeanella Flattery, MD  ciprofloxacin (CIPRO) 500 MG tablet Take 1 tablet (500 mg total) by mouth daily with breakfast. 10/04/20   Amin, Jeanella Flattery, MD  folic acid (FOLVITE) 1 MG tablet Take 1 tablet (1 mg total) by mouth daily. 05/18/20   Amin, Jeanella Flattery, MD  memantine [facility] TITRATION PAK) tablet pack 5 mg/day for =1 week; 5 mg twice daily for =1 week; 15 mg/day given in 5 mg and 10 mg separated doses for =1 week; then 10 mg twice daily 09/23/20   Garvin Fila, MD  memantine (NAMENDA) 10 MG tablet Take 1 tablet (10 mg total) by mouth 2 (two) times daily. Patient not taking: No sig reported 09/23/20   Garvin Fila, MD  Multiple Vitamin (MULTIVITAMIN WITH MINERALS) TABS tablet Take 1 tablet by mouth daily. 05/18/20   Amin, Ankit Chirag, MD  senna-docusate (SENOKOT-S) 8.6-50 MG tablet Take 2 tablets by mouth at bedtime as needed for mild constipation. Patient taking differently: Take 1 tablet by mouth at bedtime. 05/18/20   Amin, Jeanella Flattery, MD  thiamine 100 MG tablet Take 1  tablet (100 mg total) by mouth daily. 05/18/20   Amin, Jeanella Flattery, MD  vitamin B-12 1000 MCG tablet Take 1 tablet (1,000 mcg total) by mouth daily. 05/23/20   Damita Lack, MD    Allergies    Patient has no known allergies.  Review of Systems   Review of Systems  Respiratory: Positive for shortness of breath.   All other systems reviewed and are negative.   Physical Exam Updated Vital Signs BP (!) 233/119   Pulse (!) 124   Temp (!) 96.8 F (36 C) (Axillary)   Resp (!) 27   Ht 5' (1.524 m)   Wt 49 kg   SpO2 100%   BMI 21.10 kg/m   Physical  Exam Vitals and nursing note reviewed.   75 year old female, resting comfortably and in no acute distress. Vital signs are significant for elevated heart rate, respiratory rate, blood pressure. Oxygen saturation is 100%, which is normal. Head is normocephalic and atraumatic. PERRLA, EOMI. Oropharynx is clear. Neck is nontender and supple without adenopathy or JVD. Back is nontender and there is no CVA tenderness. Lungs have bibasilar rales about halfway up without wheezes or rhonchi. Chest is nontender. Heart is tachycardic without murmur. Abdomen is soft, flat, nontender without masses or hepatosplenomegaly and peristalsis is normoactive. Extremities have no cyanosis or edema, full range of motion is present. Skin is warm and dry without rash. Neurologic: Awake and alert, oriented to person and place, cranial nerves are intact, moves all extremities equally.  ED Results / Procedures / Treatments   Labs (all labs ordered are listed, but only abnormal results are displayed) Labs Reviewed  BRAIN NATRIURETIC PEPTIDE - Abnormal; Notable for the following components:      Result Value   B Natriuretic Peptide 1,884.3 (*)    All other components within normal limits  COMPREHENSIVE METABOLIC PANEL - Abnormal; Notable for the following components:   CO2 20 (*)    Glucose, Bld 185 (*)    BUN 54 (*)    Creatinine, Ser 2.78 (*)    Calcium 8.4 (*)    Total Protein 6.0 (*)    Albumin 3.2 (*)    GFR, Estimated 17 (*)    All other components within normal limits  CBC WITH DIFFERENTIAL/PLATELET - Abnormal; Notable for the following components:   WBC 16.9 (*)    RBC 3.05 (*)    Hemoglobin 9.3 (*)    HCT 30.0 (*)    Neutro Abs 9.5 (*)    Lymphs Abs 5.8 (*)    Eosinophils Absolute 0.6 (*)    All other components within normal limits  D-DIMER, QUANTITATIVE - Abnormal; Notable for the following components:   D-Dimer, Quant 4.65 (*)    All other components within normal limits  TROPONIN I (HIGH  SENSITIVITY) - Abnormal; Notable for the following components:   Troponin I (High Sensitivity) 94 (*)    All other components within normal limits  SARS CORONAVIRUS 2 (TAT 6-24 HRS)  HEPARIN LEVEL (UNFRACTIONATED)  TROPONIN I (HIGH SENSITIVITY)    EKG EKG Interpretation  Date/Time:  Wednesday December 11 2020 00:41:11 EDT Ventricular Rate:  124 PR Interval:  157 QRS Duration: 77 QT Interval:  315 QTC Calculation: 453 R Axis:   -12 Text Interpretation: Sinus tachycardia Anterior infarct, old Nonspecific ST abnormality When compared with ECG of 09/29/2020, No significant change was found Confirmed by Delora Fuel [id number]) on 12/11/2020 12:52:34 AM   Radiology DG Chest [facility] [address]  Result Date: 12/11/2020 CLINICAL DATA:  Dyspnea EXAM: PORTABLE CHEST 1 VIEW COMPARISON:  09/30/2020 FINDINGS: The lungs are symmetrically well expanded. Since the prior examination, there has developed trace diffuse interstitial pulmonary infiltrate, most in keeping with trace pulmonary edema, likely cardiogenic in nature. Cardiac size is mildly enlarged. No pneumothorax or pleural effusion. No acute bone abnormality. IMPRESSION: Interval development of mild pulmonary edema, likely cardiogenic in nature. Electronically Signed   By: Fidela Salisbury MD   On: 12/11/2020 01:02    Procedures Procedures  CRITICAL CARE Performed by: Delora Fuel Total critical care time: 65 minutes Critical care time was exclusive of separately billable procedures and treating other patients. Critical care was necessary to treat or prevent imminent or life-threatening deterioration. Critical care was time spent personally by me on the following activities: development of treatment plan with patient and/or surrogate as well as nursing, discussions with consultants, evaluation of patient's response to treatment, examination of patient, obtaining history from patient or surrogate, ordering and performing treatments and interventions, ordering  and review of laboratory studies, ordering and review of radiographic studies, pulse oximetry and re-evaluation of patient's condition.  Medications Ordered in ED Medications  heparin ADULT infusion 100 units/mL (25000 units/238m) (600 Units/hr Intravenous New Bag/Given 12/11/20 0254)  furosemide (LASIX) injection 40 mg (40 mg Intravenous Given 12/11/20 0241)  heparin bolus via infusion 3,000 Units (3,000 Units Intravenous Bolus from Bag 12/11/20 0254)  nitroGLYCERIN (NITROGLYN) 2 % ointment 1 inch (1 inch Topical Given 12/11/20 0430)    ED Course  I have reviewed the triage vital signs and the nursing notes.  Pertinent labs & imaging results that were available during my care of the patient were reviewed by me and considered in my medical decision making (see chart for details).  MDM Rules/Calculators/A&P Acute dyspnea of uncertain cause.  Elevated blood pressure and heart rate gets concern for pulmonary edema, even though she does not have any significant cardiac history.  Consider pneumonia, pulmonary embolism.  Old records are reviewed, and she has no relevant past visits.  Echocardiogram last year did show grade 1 diastolic dysfunction.  ECG shows sinus tachycardia.  Chest x-ray is consistent with heart failure with pulmonary edema.  She is given a dose of furosemide and started on intravenous nitroglycerin.  There was significant improvement in her breathing with this, and she was able to be transitioned positions to nasal cannula and topical nitrates.  Labs show evidence of acute kidney injury.  Creatinine is 2.78 compared with 1.60 on 10/04/2020.  Troponin is moderately elevated at 94.  Prior troponin levels have been elevated, but not that high.  BNP is markedly elevated at 1884, consistent with heart failure exacerbation.  Hemoglobin is 9.3 which is a slight drop from prior.  Leukocytosis is present but with normal differential, felt likely to represent stress reaction.  D-dimer is markedly  elevated at 4.65.  At this point, dyspnea seems rather clearly to be secondary to heart failure.  However, with elevated troponin, she is started on heparin.  Decision will need to be made whether to pursue nuclear medicine lung scan to rule out pulmonary embolism.  Case is discussed with Dr. KHal Hopeof [facility], who agrees to admit the patient.  Final Clinical Impression(s) / ED Diagnoses Final diagnoses:  Acute respiratory failure with hypoxia (HCC)  Acute diastolic heart failure (HCC)  Acute kidney injury (nontraumatic) (HCC)  Elevated troponin  Elevated d-dimer  Normochromic normocytic anemia    Rx / DC Orders ED Discharge  Orders    None       Delora Fuel, MD [id number] [phone]

## 2020-12-11 NOTE — ED Notes (Signed)
Bladder scan attempted. Possible equipment malfunction.

## 2020-12-11 NOTE — ED Notes (Signed)
Patient with  Ultrasound for echo

## 2020-12-11 NOTE — Progress Notes (Signed)
Heart Failure Navigator Progress Note  Assessed for Heart & Vascular TOC clinic readiness.  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   Navigator available for reassessment of patient.   Pricilla Holm, RN, BSN Heart Failure Nurse Navigator [phone]

## 2020-12-11 NOTE — ED Notes (Signed)
Pt appeared to be crying in room, when asked what was wrong pt states "I want my kids". Explained to pt that it was early in the morning and perhaps she would have a visitor later. Pt comforted and stopped crying. Pt repositioned in bed and assisted with meal tray. Pt was laying on right side where BP cuff was, BP moved to left arm and new BP obtained. Pt is alert and oriented x4

## 2020-12-11 NOTE — Progress Notes (Signed)
[facility] for Heparin Indication: chest pain/ACS  No Known Allergies  Patient Measurements: Height: 5' (152.4 cm) Weight: 53 kg (116 lb 13.5 oz) IBW/kg (Calculated) : 45.5 Heparin Dosing Weight: 49 kg  Vital Signs: Temp: 98.2 F (36.8 C) (03/30 1927) Temp Source: Oral (03/30 1927) BP: 112/95 (03/30 1927) Pulse Rate: 100 (03/30 1927)  Labs: Recent Labs    12/11/20 0115 12/11/20 0407 12/11/20 0531 12/11/20 1140 12/11/20 1535 12/11/20 2201  HGB 9.3*  --  9.0*  --   --   --   HCT 30.0*  --  28.5*  --   --   --   PLT 267  --  245  --   --   --   HEPARINUNFRC  --   --   --  0.24*  --  0.11*  CREATININE 2.78*  --  3.07*  --   --   --   TROPONINIHS 94* 166* 201*  --  141*  --     Estimated Creatinine Clearance: 11.2 mL/min (A) (by C-G formula based on SCr of 3.07 mg/dL (H)).   Medical History: Past Medical History:  Diagnosis Date  . Acute heart failure ([place])   . Dementia ([place])   . Hypertension   . Stroke ([place]) 05/13/2020  . UTI (urinary tract infection) 09/2020    Assessment: 75 y.o. F presents with SOB and elevated troponin. D-dimer high at 4.65.To begin heparin for r/o ACS and r/o VTE. No anticoagulation PTA. V/Q scan negative. LE dopplers pending.  Heparin level low at 0.11 on heparin drip 700 uts/hr but patient pulled IV out earlier - now replaced. CBC stable. No bleeding or issues with infusion per discussion with RN.  Goal of Therapy:  Heparin level 0.3-0.7 units/ml Monitor platelets by anticoagulation protocol: Yes   Plan:  Increase slightly  heparin drip to 800 units/hr Monitor daily heparin level and CBC, s/sx bleeding F/u Cards w/u, VTE r/o    Bonnita Nasuti Pharm.D. CPP, BCPS Clinical Pharmacist [phone] 12/11/2020 10:37 PM    12/11/2020 10:35 PM

## 2020-12-11 NOTE — Progress Notes (Signed)
PT Cancellation Note  Patient Details Name: Melinda Olson MRN: [id number] DOB: Aug 17, 1946   Cancelled Treatment:    Reason Eval/Treat Not Completed: Medical issues which prohibited therapy Per notes, pt awaiting VQ scan and LE scan to check for blood clots. Will hold until pt medically appropriate and follow up as schedule allows.   Lou Miner, DPT  Acute Rehabilitation Services  Pager: [phone] Office: [phone]    Rudean Hitt 12/11/2020, 9:23 AM

## 2020-12-11 NOTE — ED Notes (Signed)
Nitro paste removed at this time

## 2020-12-11 NOTE — ED Notes (Signed)
Called RT for breathing tx. SWOT arrived. Asked RT to call RT on 3E so should could get it as soon as she gets up to her room. Notified accepting RN

## 2020-12-11 NOTE — Progress Notes (Signed)
[facility] for Heparin Indication: chest pain/ACS  No Known Allergies  Patient Measurements: Height: 5' (152.4 cm) Weight: 49 kg (108 lb 0.4 oz) IBW/kg (Calculated) : 45.5 Heparin Dosing Weight: 49 kg  Vital Signs: BP: 115/74 (03/30 1130) Pulse Rate: 92 (03/30 1130)  Labs: Recent Labs    12/11/20 0115 12/11/20 0407 12/11/20 0531 12/11/20 1140  HGB 9.3*  --  9.0*  --   HCT 30.0*  --  28.5*  --   PLT 267  --  245  --   HEPARINUNFRC  --   --   --  0.24*  CREATININE 2.78*  --  3.07*  --   TROPONINIHS 94* 166* 201*  --     Estimated Creatinine Clearance: 11.2 mL/min (A) (by C-G formula based on SCr of 3.07 mg/dL (H)).   Medical History: Past Medical History:  Diagnosis Date  . Hypertension   . Stroke [facility]) 05/13/2020    Assessment: 75 y.o. F presents with SOB and elevated troponin. D-dimer high at 4.65.To begin heparin for r/o ACS and r/o VTE. No anticoagulation PTA. V/Q scan negative. LE dopplers pending.  Heparin level low at 0.24. CBC stable. No bleeding or issues with infusion per discussion with RN.  Goal of Therapy:  Heparin level 0.3-0.7 units/ml Monitor platelets by anticoagulation protocol: Yes   Plan:  Increase heparin drip to 700 units/hr Check 8hr heparin level Monitor daily CBC, s/sx bleeding F/u Cards w/u, VTE r/o   Arturo Morton, PharmD, BCPS Please check AMION for all [facility] contact numbers Clinical Pharmacist 12/11/2020 1:01 PM

## 2020-12-11 NOTE — ED Triage Notes (Signed)
Pt brought in by EMS from home for shortness of breat. Per crew, pt's family went to check on her around midnight and found her having difficulty breathing. Pt has no prior respiratory problems. History of dementia, high cholesterol and stroke.

## 2020-12-11 NOTE — ED Notes (Signed)
Spoke w/ Dr Candiss Norse to give him the critical results for serum osmolarity.

## 2020-12-11 NOTE — Progress Notes (Signed)
ANTICOAGULATION CONSULT NOTE - Initial Consult  Pharmacy Consult for Heparin Indication: chest pain/ACS  No Known Allergies  Patient Measurements: Height: 5' (152.4 cm) Weight: 49 kg (108 lb 0.4 oz) IBW/kg (Calculated) : 45.5 Heparin Dosing Weight: TBW  Vital Signs: Temp: 96.8 F (36 C) (03/30 0035) Temp Source: Axillary (03/30 0035) BP: 205/87 (03/30 0115) Pulse Rate: 118 (03/30 0115)  Labs: Recent Labs    12/11/20 0115  HGB 9.3*  HCT 30.0*  PLT 267  CREATININE 2.78*  TROPONINIHS 94*    Estimated Creatinine Clearance: 12.4 mL/min (A) (by C-G formula based on SCr of 2.78 mg/dL (H)).   Medical History: Past Medical History:  Diagnosis Date  . Hypertension   . Stroke [facility]) 05/13/2020    Medications:  See electronic med rec  Assessment: 75 y.o. F presents with SOB. Trop 94. D-dimer 4.65.To begin heparin for r/o ACS. No AC PTA. Hgb 9.3 on admission.  Goal of Therapy:  Heparin level 0.3-0.7 units/ml Monitor platelets by anticoagulation protocol: Yes   Plan:  Heparin IV bolus 3000 units Heparin gtt at 600 units/hr Will f/u heparin level in 8 hours Daily heparin level and CBC  Sherlon Handing, PharmD, BCPS Please see amion for complete clinical pharmacist phone list 12/11/2020,2:10 AM

## 2020-12-11 NOTE — Consult Note (Signed)
[facility]  INPATIENT CONSULTATION  Reason for Consultation: AKI on CKD Requesting Provider: Dr. Candiss Norse  HPI: Melinda Olson is an 75 y.o. 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   Pt presented with dyspnea yesterday, found to have SBP in the 200s, CXR pulmonary edema, elevated d dimer so heparin gtt started.  She was given lasix 40 IV x 1 dose. BP treated with NTG gtt then paste, IV hydralazine. VQ scan low probability. No I/Os are recorded as she's incontinent but she feels dyspnea is improved today.  She's been started on imdur 30 and metoprolol 25 BID. TTE pending.   Cr last fall 1.1 > COVID 2.6 --> 1.6. Outpt f/u post COVID Cr 1.76.  11/18/20 [facility] visit Cr 2.57.  Presentation yesterday 2.78 1am > this AM 3/30 3.07 with K 3.6, Na 139, Bicarb 18, BUN 61, Ca 8.6, alb 3.2, Hb 9.  COVID neg.   She follows with Dr. Joelyn Oms at [facility] - has had just 1 visit 11/18/20. BP was 119/70 - amlodipine held.  Renal US R 8cm, L 7.8, echogenic. Myeloma screen negative.  Per report HH was measuring BPs at home and they have been acceptable.   Currently she denies complaints but is not fully oriented.   No family is present.   Son confirmed on phone BP has been ok at home since amlodipine was stopped.  PT has released her as of last week since there is 'no chance' she will be ambulatory again.  The dyspnea occurred about 1 hr after she went to bed.   PMH: Past Medical History:  Diagnosis Date  . Hypertension   . Stroke [facility]) 05/13/2020   PSH: History reviewed. No pertinent surgical history.  Past Medical History:  Diagnosis Date  . Hypertension   . Stroke ([place]) 05/13/2020    Medications:  I have reviewed the patient's current medications.  (Not in a hospital admission)   ALLERGIES:  No Known Allergies  FAM HX: Family History  Family  history unknown: Yes    Social History:   reports that she quit smoking about 6 months ago. Her smoking use included cigarettes. She has never used smokeless tobacco. She reports that she does not drink alcohol and does not use drugs.  ROS: 12 system ROS negative except per HPI above though limited by patient memory issues  Blood pressure 115/75, pulse 87, temperature 98.1 F (36.7 C), temperature source Oral, resp. rate 19, height 5' (1.524 m), weight 53 kg, SpO2 96 %. PHYSICAL EXAM: Gen: sitting up awake, chronically ill and frail  Eyes: anicteric ENT: MMM, poor dentition Neck: supple CV:  RRR Abd:  Soft, nontender Lungs: rales in bases GU: purewick Extr:  No edema Neuro: oriented to person and [place], not year, not details; known L sided hemiparesis s/p CVA Skin: no rashes   Results for orders placed or performed during the hospital encounter of 12/11/20 (from the past 48 hour(s))  Troponin I (High Sensitivity)     Status: Abnormal   Collection Time: 12/11/20  1:15 AM  Result Value Ref Range   Troponin I (High Sensitivity) 94 (H) <18 ng/L    Comment: (NOTE) Elevated high sensitivity troponin I (hsTnI) values and significant  changes across serial measurements may suggest ACS but many other  chronic and acute conditions are known to elevate hsTnI results.  Refer to the "Links" section for chest pain algorithms and additional  guidance. Performed at [facility], [place] [address], [place], [place] [address]   Brain natriuretic peptide     Status: Abnormal   Collection Time: 12/11/20  1:15 AM  Result Value Ref Range   B Natriuretic Peptide 1,884.3 (H) 0.0 - 100.0 pg/mL    Comment: Performed at [facility] [address] [place], [place] [address]  Comprehensive metabolic panel     Status: Abnormal   Collection Time: 12/11/20  1:15 AM  Result Value Ref Range   Sodium 140 135 - 145 mmol/L   Potassium 3.6 3.5 - 5.1 mmol/L   Chloride 108 98 - 111 mmol/L    CO2 20 (L) 22 - 32 mmol/L   Glucose, Bld 185 (H) 70 - 99 mg/dL    Comment: Glucose reference range applies only to samples taken after fasting for at least 8 hours.   BUN 54 (H) 8 - 23 mg/dL   Creatinine, Ser 2.78 (H) 0.44 - 1.00 mg/dL   Calcium 8.4 (L) 8.9 - 10.3 mg/dL   Total Protein 6.0 (L) 6.5 - 8.1 g/dL   Albumin 3.2 (L) 3.5 - 5.0 g/dL   AST 22 15 - 41 U/L   ALT 12 0 - 44 U/L   Alkaline Phosphatase 70 38 - 126 U/L   Total Bilirubin 0.5 0.3 - 1.2 mg/dL   GFR, Estimated 17 (L) >60 mL/min    Comment: (NOTE) Calculated using the CKD-EPI Creatinine Equation (2021)    Anion gap 12 5 - 15    Comment: Performed at [facility], [place] [address], [place], [place] [address]  CBC with Differential     Status: Abnormal   Collection Time: 12/11/20  1:15 AM  Result Value Ref Range   WBC 16.9 (H) 4.0 - 10.5 K/uL   RBC 3.05 (L) 3.87 - 5.11 MIL/uL   Hemoglobin 9.3 (L) 12.0 - 15.0 g/dL   HCT 30.0 (L) 36.0 - 46.0 %   MCV 98.4 80.0 - 100.0 fL   MCH 30.5 26.0 - 34.0 pg   MCHC 31.0 30.0 - 36.0 g/dL   RDW 13.6 11.5 - 15.5 %   Platelets 267 150 - 400 K/uL   nRBC 0.0 0.0 - 0.2 %   Neutrophils Relative % 55 %   Neutro Abs 9.5 (H) 1.7 - 7.7 K/uL   Lymphocytes Relative 35 %   Lymphs Abs 5.8 (H) 0.7 - 4.0 K/uL   Monocytes Relative 5 %   Monocytes Absolute 0.8 0.1 - 1.0 K/uL   Eosinophils Relative 4 %   Eosinophils Absolute 0.6 (H) 0.0 - 0.5 K/uL   Basophils Relative 1 %   Basophils Absolute 0.1 0.0 - 0.1 K/uL   Immature Granulocytes 0 %   Abs Immature Granulocytes 0.06 0.00 - 0.07 K/uL    Comment: Performed at [facility], [place] [address] [place],  [address]  D-dimer, quantitative     Status: Abnormal   Collection Time: 12/11/20  1:15 AM  Result Value Ref Range   D-Dimer, Quant 4.65 (H) 0.00 - 0.50 ug/mL-FEU    Comment: (NOTE) At the manufacturer cut-off value of 0.5 g/mL FEU, this assay has a negative predictive value of 95-100%.This assay is intended for use in  conjunction with a clinical pretest probability (PTP) assessment model to exclude pulmonary embolism (PE) and deep venous thrombosis (DVT) in outpatients suspected of PE or DVT. Results should  be correlated with clinical presentation. Performed at [facility], [place] [address] [place], [place] [address]   Troponin I (High Sensitivity)     Status: Abnormal   Collection Time: 12/11/20  4:07 AM  Result Value Ref Range   Troponin I (High Sensitivity) 166 (HH) <18 ng/L    Comment: CRITICAL RESULT CALLED TO, READ BACK BY AND VERIFIED WITH: Ardeth Perfect, RN [phone] 12/11/20 A. MCDOWELL (NOTE) Elevated high sensitivity troponin I (hsTnI) values and significant  changes across serial measurements may suggest ACS but many other  chronic and acute conditions are known to elevate hsTnI results.  Refer to the Links section for chest pain algorithms and additional  guidance. Performed at [facility], [place] [address] [place], [place] [address]   SARS CORONAVIRUS 2 (TAT 6-24 HRS) Nasopharyngeal Nasopharyngeal Swab     Status: None   Collection Time: 12/11/20  4:07 AM   Specimen: Nasopharyngeal Swab  Result Value Ref Range   SARS Coronavirus 2 NEGATIVE NEGATIVE    Comment: (NOTE) SARS-CoV-2 target nucleic acids are NOT DETECTED.  The SARS-CoV-2 RNA is generally detectable in upper and lower respiratory specimens during the acute phase of infection. Negative results do not preclude SARS-CoV-2 infection, do not rule out co-infections with other pathogens, and should not be used as the sole basis for treatment or other patient management decisions. Negative results must be combined with clinical observations, patient history, and epidemiological information. The expected result is Negative.  Fact Sheet for Patients: SugarRoll.be  Fact Sheet for Healthcare Providers: https://www.woods-mathews.com/  This test is not yet approved or cleared by the  [place] FDA and  has been authorized for detection and/or diagnosis of SARS-CoV-2 by FDA under an Emergency Use Authorization (EUA). This EUA will remain  in effect (meaning this test can be used) for the duration of the COVID-19 declaration under Se ction 564(b)(1) of the Act, 21 U.S.C. section 360bbb-3(b)(1), unless the authorization is terminated or revoked sooner.  Performed at [facility], [place] [address], [place], [place] [address]   Comprehensive metabolic panel     Status: Abnormal   Collection Time: 12/11/20  5:31 AM  Result Value Ref Range   Sodium 139 135 - 145 mmol/L   Potassium 3.6 3.5 - 5.1 mmol/L   Chloride 109 98 - 111 mmol/L   CO2 18 (L) 22 - 32 mmol/L   Glucose, Bld 250 (H) 70 - 99 mg/dL    Comment: Glucose reference range applies only to samples taken after fasting for at least 8 hours.   BUN 61 (H) 8 - 23 mg/dL   Creatinine, Ser 3.07 (H) 0.44 - 1.00 mg/dL   Calcium 8.6 (L) 8.9 - 10.3 mg/dL   Total Protein 6.2 (L) 6.5 - 8.1 g/dL   Albumin 3.2 (L) 3.5 - 5.0 g/dL   AST 23 15 - 41 U/L   ALT 12 0 - 44 U/L   Alkaline Phosphatase 74 38 - 126 U/L   Total Bilirubin 0.6 0.3 - 1.2 mg/dL   GFR, Estimated 15 (L) >60 mL/min    Comment: (NOTE) Calculated using the CKD-EPI Creatinine Equation (2021)    Anion gap 12 5 - 15    Comment: Performed at [facility], [place] [address], [place], [place] [address]  Magnesium     Status: Abnormal   Collection Time: 12/11/20  5:31 AM  Result Value Ref Range   Magnesium 2.5 (H) 1.7 - 2.4 mg/dL  Comment: Performed at [facility], [place] [address], [place], [place] [address]  CBC WITH DIFFERENTIAL     Status: Abnormal   Collection Time: 12/11/20  5:31 AM  Result Value Ref Range   WBC 11.0 (H) 4.0 - 10.5 K/uL   RBC 2.94 (L) 3.87 - 5.11 MIL/uL   Hemoglobin 9.0 (L) 12.0 - 15.0 g/dL   HCT 28.5 (L) 36.0 - 46.0 %   MCV 96.9 80.0 - 100.0 fL   MCH 30.6 26.0 - 34.0 pg   MCHC 31.6 30.0 - 36.0 g/dL   RDW 13.6 11.5 -  15.5 %   Platelets 245 150 - 400 K/uL   nRBC 0.0 0.0 - 0.2 %   Neutrophils Relative % 95 %   Neutro Abs 10.4 (H) 1.7 - 7.7 K/uL   Lymphocytes Relative 4 %   Lymphs Abs 0.4 (L) 0.7 - 4.0 K/uL   Monocytes Relative 1 %   Monocytes Absolute 0.1 0.1 - 1.0 K/uL   Eosinophils Relative 0 %   Eosinophils Absolute 0.0 0.0 - 0.5 K/uL   Basophils Relative 0 %   Basophils Absolute 0.0 0.0 - 0.1 K/uL   Immature Granulocytes 0 %   Abs Immature Granulocytes 0.04 0.00 - 0.07 K/uL    Comment: Performed at [facility], [address], [place], [place] [address]  TSH     Status: Abnormal   Collection Time: 12/11/20  5:31 AM  Result Value Ref Range   TSH 5.905 (H) 0.350 - 4.500 uIU/mL    Comment: Performed by a 3rd Generation assay with a functional sensitivity of <=0.01 uIU/mL. Performed at [facility], [place] [address] [place], [place] [address]   Troponin I (High Sensitivity)     Status: Abnormal   Collection Time: 12/11/20  5:31 AM  Result Value Ref Range   Troponin I (High Sensitivity) 201 (HH) <18 ng/L    Comment: CRITICAL VALUE NOTED.  VALUE IS CONSISTENT WITH PREVIOUSLY REPORTED AND CALLED VALUE. (NOTE) Elevated high sensitivity troponin I (hsTnI) values and significant  changes across serial measurements may suggest ACS but many other  chronic and acute conditions are known to elevate hsTnI results.  Refer to the Links section for chest pain algorithms and additional  guidance. Performed at [facility], [place] [address] [place], [place] [address]   Osmolality     Status: Abnormal   Collection Time: 12/11/20  9:04 AM  Result Value Ref Range   Osmolality 325 (HH) 275 - 295 mOsm/kg    Comment: REPEATED TO VERIFY CRITICAL RESULT CALLED TO, READ BACK BY AND VERIFIED WITH: ELIZABETH TAYLOR RN.'@1025'$  ON 3.30.22 BY TCALDWELL MT. Performed at [facility], [place] [address], [place], [place] [address]   Procalcitonin - Baseline     Status: None   Collection Time:  12/11/20  9:04 AM  Result Value Ref Range   Procalcitonin 0.79 ng/mL    Comment:        Interpretation: PCT > 0.5 ng/mL and <= 2 ng/mL: Systemic infection (sepsis) is possible, but other conditions are known to elevate PCT as well. (NOTE)       Sepsis PCT Algorithm           Lower Respiratory Tract                                      Infection PCT  Algorithm    ----------------------------     ----------------------------         PCT < 0.25 ng/mL                PCT < 0.10 ng/mL          Strongly encourage             Strongly discourage   discontinuation of antibiotics    initiation of antibiotics    ----------------------------     -----------------------------       PCT 0.25 - 0.50 ng/mL            PCT 0.10 - 0.25 ng/mL               OR       >80% decrease in PCT            Discourage initiation of                                            antibiotics      Encourage discontinuation           of antibiotics    ----------------------------     -----------------------------         PCT >= 0.50 ng/mL              PCT 0.26 - 0.50 ng/mL                AND       <80% decrease in PCT             Encourage initiation of                                             antibiotics       Encourage continuation           of antibiotics    ----------------------------     -----------------------------        PCT >= 0.50 ng/mL                  PCT > 0.50 ng/mL               AND         increase in PCT                  Strongly encourage                                      initiation of antibiotics    Strongly encourage escalation           of antibiotics                                     -----------------------------                                           PCT <= 0.25 ng/mL  OR                                        > 80% decrease in PCT                                      Discontinue / Do not initiate                                              antibiotics  Performed at [facility], [place] [address], [place], [place] [address]   Heparin level (unfractionated)     Status: Abnormal   Collection Time: 12/11/20 11:40 AM  Result Value Ref Range   Heparin Unfractionated 0.24 (L) 0.30 - 0.70 IU/mL    Comment: (NOTE) If heparin results are below expected values, and patient dosage has  been confirmed, suggest follow up testing of antithrombin III levels. Performed at [facility], [place] [address] [place], [place] [address]     NM Pulmonary Perf and Vent  Result Date: 12/11/2020 CLINICAL DATA:  Dyspnea with elevated D-dimer EXAM: NUCLEAR MEDICINE PERFUSION LUNG SCAN TECHNIQUE: Perfusion images were obtained in multiple projections after intravenous injection of radiopharmaceutical. Views: Anterior, posterior, left lateral, right lateral, RPO, LPO, LAO, RAO RADIOPHARMACEUTICALS:  4.15 mCi Tc-76mMAA IV COMPARISON:  Chest radiograph December 11, 2020 FINDINGS: Radiotracer uptake bilaterally is homogeneous and symmetric. No perfusion defects evident. IMPRESSION: No perfusion defects evident on either side. No findings indicative of pulmonary embolus. Electronically Signed   By: WLowella GripIII M.D.   On: 12/11/2020 11:01   DG Chest Port 1 View  Result Date: 12/11/2020 CLINICAL DATA:  Dyspnea EXAM: PORTABLE CHEST 1 VIEW COMPARISON:  09/30/2020 FINDINGS: The lungs are symmetrically well expanded. Since the prior examination, there has developed trace diffuse interstitial pulmonary infiltrate, most in keeping with trace pulmonary edema, likely cardiogenic in nature. Cardiac size is mildly enlarged. No pneumothorax or pleural effusion. No acute bone abnormality. IMPRESSION: Interval development of mild pulmonary edema, likely cardiogenic in nature. Electronically Signed   By: AFidela SalisburyMD   On: 12/11/2020 01:02   VAS [place]OWER EXTREMITY VENOUS (DVT)  Result Date: 12/11/2020  Lower Venous DVT Study Indications: Elevated  d-dimer, recent covid infection.  Comparison Study: No previous exams Performing Technologist: JRogelia Rohrer Examination Guidelines: A complete evaluation includes B-mode imaging, spectral Doppler, color Doppler, and power Doppler as needed of all accessible portions of each vessel. Bilateral testing is considered an integral part of a complete examination. Limited examinations for reoccurring indications may be performed as noted. The reflux portion of the exam is performed with the patient in reverse Trendelenburg.  +---------+---------------+---------+-----------+----------+--------------+ RIGHT    CompressibilityPhasicitySpontaneityPropertiesThrombus Aging +---------+---------------+---------+-----------+----------+--------------+ CFV      Full           Yes      Yes                                 +---------+---------------+---------+-----------+----------+--------------+ SFJ      Full                                                        +---------+---------------+---------+-----------+----------+--------------+  FV Prox  Full           Yes      Yes                                 +---------+---------------+---------+-----------+----------+--------------+ FV Mid   Full           Yes      Yes                                 +---------+---------------+---------+-----------+----------+--------------+ FV DistalFull           Yes      Yes                                 +---------+---------------+---------+-----------+----------+--------------+ PFV      Full                                                        +---------+---------------+---------+-----------+----------+--------------+ POP      Full           Yes      Yes                                 +---------+---------------+---------+-----------+----------+--------------+ PTV      Full                                                         +---------+---------------+---------+-----------+----------+--------------+ PERO     Full                                                        +---------+---------------+---------+-----------+----------+--------------+   +---------+---------------+---------+-----------+----------+--------------+ LEFT     CompressibilityPhasicitySpontaneityPropertiesThrombus Aging +---------+---------------+---------+-----------+----------+--------------+ CFV      Full           Yes      Yes                                 +---------+---------------+---------+-----------+----------+--------------+ SFJ      Full                                                        +---------+---------------+---------+-----------+----------+--------------+ FV Prox  Full           Yes      Yes                                 +---------+---------------+---------+-----------+----------+--------------+ FV Mid   Full  Yes      Yes                                 +---------+---------------+---------+-----------+----------+--------------+ FV DistalFull           Yes      Yes                                 +---------+---------------+---------+-----------+----------+--------------+ PFV      Full                                                        +---------+---------------+---------+-----------+----------+--------------+ POP      Full           Yes      Yes                                 +---------+---------------+---------+-----------+----------+--------------+ PTV      Full                                                        +---------+---------------+---------+-----------+----------+--------------+ PERO     Full                                                        +---------+---------------+---------+-----------+----------+--------------+     Summary: BILATERAL: - No evidence of deep vein thrombosis seen in the lower extremities, bilaterally. - No evidence of  superficial venous thrombosis in the lower extremities, bilaterally. -No evidence of popliteal cyst, bilaterally.   *See table(s) above for measurements and observations.    Preliminary     Assessment/Plan **Acute hypoxic respiratory failure: CXR with pulmonary edema with BP > 200.  Has improved with diuresis but rales still present - will provide more gentle diuresis overnight with lasix 20 IV.  VQ low risk for PE.  TTE pending.  Has known h/o HFpEF.   **AKI on CKD:  Quite labile renal function lately but near recent baseline in the mid 2s.  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  Cr is up a bit from 2.8 to 3 after presenting with SBP > 200 and then being diuresed.  It appears she's quite sensitive to hemodynamic shifts.  She still has O2 requirement and rales so will give more gentle diuresis now with lasix 20 IV.  Recent renal US with CKD; no need to repeat, will just check a bladder scan today.  Will follow closely.  Avoid nephrotoxin, avoid hypotension, avoid hypovolemia.   **HTN urgency: SBP > 200 on presentation.  Now improved with BPs in the 100-120s.  Has been started onon imdur 30 and metoprolol 25 BID.  Of note her clinic BP recently was low and amlodipine stopped; it was fine with PT at home subsequently.   **Anemia:  Mild, follow.   **Dementia: on namenda  Will follow, page with questions.   Justin Mend 12/11/2020, 3:15 PM

## 2020-12-11 NOTE — ED Notes (Signed)
Replacement lunch tray arrived, placed on stretcher as pt is heading to her room soon.

## 2020-12-11 NOTE — ED Notes (Signed)
Patient with Ultrasound

## 2020-12-11 NOTE — H&P (Signed)
History and Physical    Melinda Olson [id number] DOB: [birth year] DOA: 12/11/2020  PCP: Scheryl Marten, PA  Patient coming from: Home.  Chief Complaint: Shortness of breath.  History obtained from patient's son.  Patient has dementia.  HPI: Melinda Olson is a 75 y.o. female with history of CVA with left-sided hemiparesis, dementia, chronic kidney disease stage IV last creatinine was around 1.6 in January 2022 when patient was admitted for Covid at that time was found to be acutely short of breath around midnight.  As per the patient's son who provided the history patient was slightly wheezing yesterday.  Became very short of breath tonight and was brought to the ER.  About 2 weeks ago patient had followed up with nephrologist for worsening renal function at the time patient amlodipine was discontinued.  Since then her blood pressure was doing stable being measured by home health aide.  Renal ultrasound done on March 17 showed chronic kidney disease.  Patient did not complain of any chest pain when patient became short of breath.  Did not have any productive cough fever or chills.  ED Course: In the ER patient was hypotensive with systolic blood pressure more than 200s.  Chest x-ray shows features concerning for pulmonary edema.  BNP was 1800 and high sensitive troponin was 94.  D-dimer came elevated at around 4 for which patient was started empirically on heparin and planning to do VQ scan since patient has worsening renal function.  Creatinine has worsened from 1.6 and it is around 2.7 now.  Patient was given Lasix 40 mg IV and admitted for acute CHF likely diastolic with hypertensive urgency likely contributing.  Covid test is still pending.  EKG shows sinus tachycardia.  Review of Systems: As per HPI, rest all negative.   Past Medical History:  Diagnosis Date  . Hypertension   . Stroke [facility]) 05/13/2020    History reviewed. No pertinent surgical history.   reports that she quit  smoking about 6 months ago. Her smoking use included cigarettes. She has never used smokeless tobacco. She reports that she does not drink alcohol and does not use drugs.  No Known Allergies  Family History  Family history unknown: Yes    Prior to Admission medications   Medication Sig Start Date End Date Taking? Authorizing Provider  amLODipine (NORVASC) 5 MG tablet Take 1 tablet (5 mg total) by mouth daily. 05/18/20 05/18/21 Yes Amin, Jeanella Flattery, MD  aspirin EC 81 MG EC tablet Take 1 tablet (81 mg total) by mouth daily. Swallow whole. 05/18/20  Yes Amin, Ankit Chirag, MD  atorvastatin (LIPITOR) 40 MG tablet Take 1 tablet (40 mg total) by mouth daily. 05/18/20  Yes Amin, Jeanella Flattery, MD  folic acid (FOLVITE) 1 MG tablet Take 1 tablet (1 mg total) by mouth daily. 05/18/20  Yes Amin, Ankit Chirag, MD  memantine (NAMENDA) 10 MG tablet Take 1 tablet (10 mg total) by mouth 2 (two) times daily. 09/23/20  Yes Garvin Fila, MD  Multiple Vitamin (MULTIVITAMIN WITH MINERALS) TABS tablet Take 1 tablet by mouth daily. 05/18/20  Yes Amin, Ankit Chirag, MD  senna-docusate (SENOKOT-S) 8.6-50 MG tablet Take 2 tablets by mouth at bedtime as needed for mild constipation. Patient taking differently: Take 1 tablet by mouth at bedtime. 05/18/20  Yes Amin, Jeanella Flattery, MD  thiamine 100 MG tablet Take 1 tablet (100 mg total) by mouth daily. 05/18/20  Yes Amin, Jeanella Flattery, MD  vitamin B-12 1000 MCG tablet Take 1  tablet (1,000 mcg total) by mouth daily. 05/23/20  Yes Amin, Jeanella Flattery, MD  memantine [facility] TITRATION PAK) tablet pack 5 mg/day for =1 week; 5 mg twice daily for =1 week; 15 mg/day given in 5 mg and 10 mg separated doses for =1 week; then 10 mg twice daily 09/23/20   Garvin Fila, MD    Physical Exam: Constitutional: Moderately built and nourished. Vitals:   12/11/20 0035 12/11/20 0043 12/11/20 0115 12/11/20 0408  BP: (!) 233/119  (!) 205/87 (!) 171/86  Pulse: (!) 124  (!) 118 (!) 115  Resp: (!) 27  (!)  24 18  Temp: (!) 96.8 F (36 C)     TempSrc: Axillary     SpO2: 100%  100% 100%  Weight:  49 kg    Height:  5' (1.524 m)     Eyes: Anicteric no pallor. ENMT: No discharge from the ears eyes nose or mouth. Neck: Mass felt.  No JVD appreciated. Respiratory: No rhonchi or crepitations. Cardiovascular: S1-S2 heard. Abdomen: Soft nontender bowel sounds present. Musculoskeletal: No edema. Skin: No rash. Neurologic: Alert awake oriented to name and with known history of left-sided hemiparesis from previous stroke. Psychiatric: Oriented to name and place.   Labs on Admission: I have personally reviewed following labs and imaging studies  CBC: Recent Labs  Lab 12/11/20 0115  WBC 16.9*  NEUTROABS 9.5*  HGB 9.3*  HCT 30.0*  MCV 98.4  PLT 99991111   Basic Metabolic Panel: Recent Labs  Lab 12/11/20 0115  NA 140  K 3.6  CL 108  CO2 20*  GLUCOSE 185*  BUN 54*  CREATININE 2.78*  CALCIUM 8.4*   GFR: Estimated Creatinine Clearance: 12.4 mL/min (A) (by C-G formula based on SCr of 2.78 mg/dL (H)). Liver Function Tests: Recent Labs  Lab 12/11/20 0115  AST 22  ALT 12  ALKPHOS 70  BILITOT 0.5  PROT 6.0*  ALBUMIN 3.2*   No results for input(s): LIPASE, AMYLASE in the last 168 hours. No results for input(s): AMMONIA in the last 168 hours. Coagulation Profile: No results for input(s): INR, PROTIME in the last 168 hours. Cardiac Enzymes: No results for input(s): CKTOTAL, CKMB, CKMBINDEX, TROPONINI in the last 168 hours. BNP (last 3 results) No results for input(s): PROBNP in the last 8760 hours. HbA1C: No results for input(s): HGBA1C in the last 72 hours. CBG: No results for input(s): GLUCAP in the last 168 hours. Lipid Profile: No results for input(s): CHOL, HDL, LDLCALC, TRIG, CHOLHDL, LDLDIRECT in the last 72 hours. Thyroid Function Tests: No results for input(s): TSH, T4TOTAL, FREET4, T3FREE, THYROIDAB in the last 72 hours. Anemia Panel: No results for input(s):  VITAMINB12, FOLATE, FERRITIN, TIBC, IRON, RETICCTPCT in the last 72 hours. Urine analysis:    Component Value Date/Time   COLORURINE YELLOW 09/30/2020 1254   APPEARANCEUR HAZY (A) 09/30/2020 1254   LABSPEC 1.010 09/30/2020 1254   PHURINE 6.0 09/30/2020 1254   GLUCOSEU NEGATIVE 09/30/2020 1254   HGBUR SMALL (A) 09/30/2020 1254   BILIRUBINUR NEGATIVE 09/30/2020 1254   KETONESUR NEGATIVE 09/30/2020 1254   PROTEINUR 30 (A) 09/30/2020 1254   NITRITE NEGATIVE 09/30/2020 1254   LEUKOCYTESUR LARGE (A) 09/30/2020 1254   Sepsis Labs: '@LABRCNTIP'$ (procalcitonin:4,lacticidven:4) )No results found for this or any previous visit (from the past 240 hour(s)).   Radiological Exams on Admission: DG Chest Port 1 View  Result Date: 12/11/2020 CLINICAL DATA:  Dyspnea EXAM: PORTABLE CHEST 1 VIEW COMPARISON:  09/30/2020 FINDINGS: The lungs are symmetrically well expanded.  Since the prior examination, there has developed trace diffuse interstitial pulmonary infiltrate, most in keeping with trace pulmonary edema, likely cardiogenic in nature. Cardiac size is mildly enlarged. No pneumothorax or pleural effusion. No acute bone abnormality. IMPRESSION: Interval development of mild pulmonary edema, likely cardiogenic in nature. Electronically Signed   By: Fidela Salisbury MD   On: 12/11/2020 01:02    EKG: Independently reviewed.  Sinus tachycardia.  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 Acute on chronic diastolic CHF last EF measured in September 2021 was 65 to 70% with grade 1 diastolic dysfunction.  Patient received 40 mg IV Lasix at this time.  We will recheck 2D echo.  Further dose of Lasix based on the response and patient's metabolic panel. 2. Hypertensive urgency likely contributing to patient's symptoms.  Initially was started on nitroglycerin infusion  but changed to nitroglycerin paste at this time.  We will keep patient on as needed IV hydralazine and follow blood pressure trends.  Patient amlodipine was recently discontinued 2 weeks ago by patient's nephrologist as per the patient's son.  May have to restart antihypertensives if pressure remains continuously high. 3. Acute on chronic kidney disease stage IV with further worsening of creatinine when compared to January 2022.  Presently received IV Lasix.  Has recently followed with nephrologist of [place] kidney disease Dr. Pearson Grippe.  May discuss with nephrology in the morning. 4. Elevated D-dimer for which VQ scan has been ordered empirically started on heparin. 5. Elevated troponin denies any chest pain could be from CHF.  Trend cardiac markers.  Check 2D echo.  Aspirin. 6. History of CVA with left-sided hemiplegia on statins and aspirin. 7. Anemia appears to be chronic likely from renal disease.  Follow CBC.  Since patient has acute CHF with worsening renal function and hypertensive urgency will need inpatient status.  Covid test is pending.   DVT prophylaxis: Heparin. Code Status: Full code. Family Communication: Patient's son. Disposition Plan: Home. Consults called: None. Admission status: Inpatient.   Rise Patience MD [facility] Pager [phone].  If 7PM-7AM, please contact night-coverage [email or web address] Password [email or web address]  12/11/2020, 5:03 AM

## 2020-12-11 NOTE — Progress Notes (Signed)
Pt pulled PIV for the second time, pt is only alert to self, she's on heparin drip. Re started another PIV, safety mitten placed.

## 2020-12-12 ENCOUNTER — Inpatient Hospital Stay (HOSPITAL_COMMUNITY): Payer: Medicare Other

## 2020-12-12 DIAGNOSIS — I509 Heart failure, unspecified: Secondary | ICD-10-CM

## 2020-12-12 LAB — CBC
HCT: 22.7 % — ABNORMAL LOW (ref 36.0–46.0)
HCT: 27.7 % — ABNORMAL LOW (ref 36.0–46.0)
Hemoglobin: 7.1 g/dL — ABNORMAL LOW (ref 12.0–15.0)
Hemoglobin: 9 g/dL — ABNORMAL LOW (ref 12.0–15.0)
MCH: 30.6 pg (ref 26.0–34.0)
MCH: 31.4 pg (ref 26.0–34.0)
MCHC: 31.3 g/dL (ref 30.0–36.0)
MCHC: 32.5 g/dL (ref 30.0–36.0)
MCV: 97.8 fL (ref 80.0–100.0)
MCV: 96.5 fL (ref 80.0–100.0)
Platelets: 231 10*3/uL (ref 150–400)
Platelets: 241 10*3/uL (ref 150–400)
RBC: 2.32 MIL/uL — ABNORMAL LOW (ref 3.87–5.11)
RBC: 2.87 MIL/uL — ABNORMAL LOW (ref 3.87–5.11)
RDW: 14.1 % (ref 11.5–15.5)
RDW: 14.1 % (ref 11.5–15.5)
WBC: 20.4 10*3/uL — ABNORMAL HIGH (ref 4.0–10.5)
WBC: 18.4 10*3/uL — ABNORMAL HIGH (ref 4.0–10.5)
nRBC: 0 % (ref 0.0–0.2)
nRBC: 0 % (ref 0.0–0.2)

## 2020-12-12 LAB — COMPREHENSIVE METABOLIC PANEL
ALT: 11 U/L (ref 0–44)
AST: 18 U/L (ref 15–41)
Albumin: 3 g/dL — ABNORMAL LOW (ref 3.5–5.0)
Alkaline Phosphatase: 61 U/L (ref 38–126)
Anion gap: 9 (ref 5–15)
BUN: 84 mg/dL — ABNORMAL HIGH (ref 8–23)
CO2: 22 mmol/L (ref 22–32)
Calcium: 8.9 mg/dL (ref 8.9–10.3)
Chloride: 107 mmol/L (ref 98–111)
Creatinine, Ser: 3.59 mg/dL — ABNORMAL HIGH (ref 0.44–1.00)
GFR, Estimated: 13 mL/min — ABNORMAL LOW (ref >60)
Glucose, Bld: 121 mg/dL — ABNORMAL HIGH (ref 70–99)
Potassium: 4.7 mmol/L (ref 3.5–5.1)
Sodium: 138 mmol/L (ref 135–145)
Total Bilirubin: 0.5 mg/dL (ref 0.3–1.2)
Total Protein: 5.8 g/dL — ABNORMAL LOW (ref 6.5–8.1)

## 2020-12-12 LAB — CBC WITH DIFFERENTIAL/PLATELET
Abs Immature Granulocytes: 0.19 10*3/uL — ABNORMAL HIGH (ref 0.00–0.07)
Basophils Absolute: 0 10*3/uL (ref 0.0–0.1)
Basophils Relative: 0 %
Eosinophils Absolute: 0 10*3/uL (ref 0.0–0.5)
Eosinophils Relative: 0 %
HCT: 23.4 % — ABNORMAL LOW (ref 36.0–46.0)
Hemoglobin: 7.5 g/dL — ABNORMAL LOW (ref 12.0–15.0)
Immature Granulocytes: 1 %
Lymphocytes Relative: 8 %
Lymphs Abs: 1.9 10*3/uL (ref 0.7–4.0)
MCH: 30.9 pg (ref 26.0–34.0)
MCHC: 32.1 g/dL (ref 30.0–36.0)
MCV: 96.3 fL (ref 80.0–100.0)
Monocytes Absolute: 1.2 10*3/uL — ABNORMAL HIGH (ref 0.1–1.0)
Monocytes Relative: 5 %
Neutro Abs: 21.4 10*3/uL — ABNORMAL HIGH (ref 1.7–7.7)
Neutrophils Relative %: 86 %
Platelets: 244 10*3/uL (ref 150–400)
RBC: 2.43 MIL/uL — ABNORMAL LOW (ref 3.87–5.11)
RDW: 14 % (ref 11.5–15.5)
WBC: 24.8 10*3/uL — ABNORMAL HIGH (ref 4.0–10.5)
nRBC: 0 % (ref 0.0–0.2)

## 2020-12-12 LAB — HEPARIN LEVEL (UNFRACTIONATED): Heparin Unfractionated: 0.29 IU/mL — ABNORMAL LOW (ref 0.30–0.70)

## 2020-12-12 LAB — BRAIN NATRIURETIC PEPTIDE: B Natriuretic Peptide: 3368.1 pg/mL — ABNORMAL HIGH (ref 0.0–100.0)

## 2020-12-12 LAB — PREPARE RBC (CROSSMATCH)

## 2020-12-12 LAB — MAGNESIUM: Magnesium: 2.4 mg/dL (ref 1.7–2.4)

## 2020-12-12 LAB — GLUCOSE, CAPILLARY: Glucose-Capillary: 207 mg/dL — ABNORMAL HIGH (ref 70–99)

## 2020-12-12 LAB — PROCALCITONIN: Procalcitonin: 2 ng/mL

## 2020-12-12 IMAGING — DX DG CHEST 1V PORT
1 series · 1 of 1 positions shown · non-contrast
Comparison: Chest x-ray [DATE].

CLINICAL DATA: Shortness of breath.

EXAM:
PORTABLE CHEST 1 VIEW

[chest]
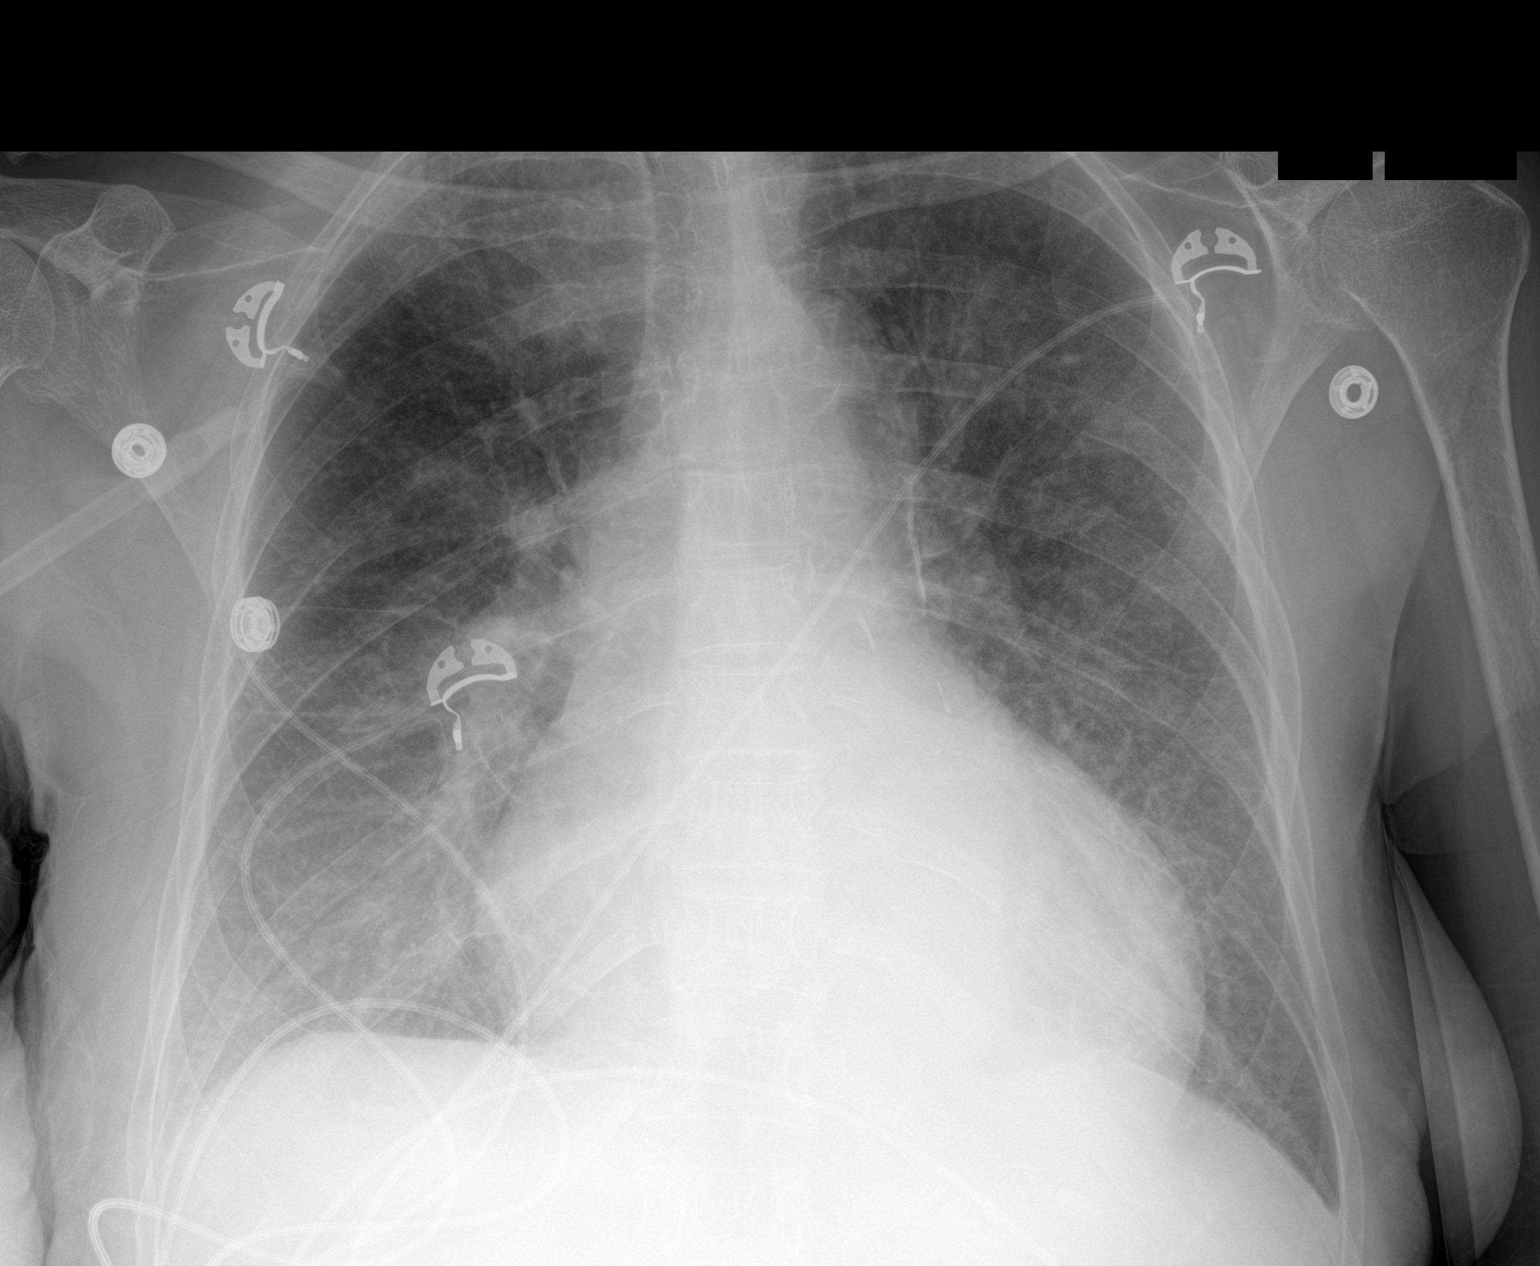

[1 of 1 positions shown; findings below may reference images not displayed]

FINDINGS: Cardiomegaly. Diffuse bilateral pulmonary infiltrates consistent
with pulmonary edema. Findings consistent CHF. Tiny bilateral
pleural effusions may be present. No pneumothorax. Aortic
atherosclerotic vascular calcification.
IMPRESSION: Cardiomegaly with diffuse bilateral pulmonary infiltrates consistent
with pulmonary edema. Findings consistent CHF. Tiny bilateral
pleural effusions may be present.

## 2020-12-12 MED ORDER — SODIUM CHLORIDE 0.9% IV SOLUTION
Freq: Once | INTRAVENOUS | Status: AC
  Administered 2020-12-12: 10 mL via INTRAVENOUS

## 2020-12-12 MED ORDER — FUROSEMIDE 10 MG/ML IJ SOLN
40.0000 mg | Freq: Once | INTRAMUSCULAR | Status: AC
  Administered 2020-12-12: 40 mg via INTRAVENOUS
  Filled 2020-12-12: qty 4

## 2020-12-12 MED ORDER — IPRATROPIUM-ALBUTEROL 0.5-2.5 (3) MG/3ML IN SOLN
3.0000 mL | RESPIRATORY_TRACT | Status: DC | PRN
  Administered 2020-12-12 – 2020-12-13 (×2): 3 mL via RESPIRATORY_TRACT
  Filled 2020-12-12 (×2): qty 3

## 2020-12-12 MED ORDER — MORPHINE SULFATE (PF) 2 MG/ML IV SOLN
2.0000 mg | Freq: Once | INTRAVENOUS | Status: AC
  Administered 2020-12-12: 2 mg via INTRAVENOUS
  Filled 2020-12-12: qty 1

## 2020-12-12 MED ORDER — DIPHENHYDRAMINE HCL 50 MG/ML IJ SOLN: 25.0000 mg | Freq: Four times a day (QID) | INTRAMUSCULAR | Status: DC | PRN

## 2020-12-12 MED ORDER — DIPHENHYDRAMINE HCL 50 MG/ML IJ SOLN
25.0000 mg | Freq: Four times a day (QID) | INTRAMUSCULAR | Status: AC | PRN
  Administered 2020-12-12: 25 mg via INTRAVENOUS
  Filled 2020-12-12: qty 1

## 2020-12-12 MED ORDER — PANTOPRAZOLE SODIUM 40 MG IV SOLR
40.0000 mg | Freq: Two times a day (BID) | INTRAVENOUS | Status: DC
  Administered 2020-12-12 – 2020-12-16 (×9): 40 mg via INTRAVENOUS
  Filled 2020-12-12 (×9): qty 40

## 2020-12-12 MED ORDER — IPRATROPIUM-ALBUTEROL 0.5-2.5 (3) MG/3ML IN SOLN
3.0000 mL | Freq: Once | RESPIRATORY_TRACT | Status: AC
  Administered 2020-12-12: 3 mL via RESPIRATORY_TRACT
  Filled 2020-12-12: qty 3

## 2020-12-12 MED ORDER — NITROGLYCERIN 2 % TD OINT
0.5000 [in_us] | TOPICAL_OINTMENT | Freq: Four times a day (QID) | TRANSDERMAL | Status: DC
  Administered 2020-12-13: 0.5 [in_us] via TOPICAL
  Filled 2020-12-12: qty 30

## 2020-12-12 NOTE — Progress Notes (Signed)
[facility] night shift.  The staff reports that the patient is having dyspnea with wheezing.  DuoNeb every 4 hours as needed ordered.  Tennis Must, MD

## 2020-12-12 NOTE — Progress Notes (Signed)
PROGRESS NOTE                                                                                                                                                                                                             Patient Demographics:    Melinda Olson, is a 75 y.o. female, DOB - October 25, 1945, [id number]  Outpatient Primary MD for the patient is [place], [place], [place]    LOS - 1  Admit date - 12/11/2020    Chief Complaint  Patient presents with  . Shortness of Breath       Brief Narrative (HPI from H&P) - Melinda Olson is a 75 y.o. female with history of CVA with left-sided hemiparesis, dementia, chronic kidney disease stage IV last creatinine was around 1.6 in January 2022 when patient was admitted for Covid at that time was found to be acutely short of breath around midnight, she had seen her nephrologist 2 weeks ago for worsening renal function her at that time her blood pressure was on the lower side and her home Norvasc was stopped.  She had no chest pain in the ER but had significant shortness of breath and orthopnea she was admitted for CHF treatment.   Subjective:    [name] today has, No headache, No chest pain, No abdominal pain - No Nausea, No new weakness tingling or numbness, much improved SOB   Assessment  & Plan :     1. Acute Hypoxic Resp. 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  Discussed with son due to her underlying dementia no heroics desired, gentle medical treatment if declines focus on comfort.    2.  Mild non-ACS pattern troponin elevation.  Likely due to in the setting of AKI on CKD 4.  Chest pain-free nonacute EKG, on  statin and beta-blocker, aspirin held due to dropping  H&H.  3.  AKI on CKD 4.  Baseline creatinine between 1.7-2 - noted recent renal ultrasound showing medical disease, nephrology has been consulted for fluid management, renal function worsening, discussed with family, not an HD candidate, treat medically.  4.  Hypertensive emergency.  Much improved could  have been high due to shortness of breath, low-dose beta-blocker and monitor.  5.  Dyslipidemia.  On statin.  6.  History of dementia.  On memantine.  Monitor.  Has developed some toxic encephalopathy, supportive care, will remain at risk for delirium.  Minimize narcotics and benzodiazepines.  7.  Elevated D-dimer.  Due to inflammation VQ scan and lower extremity venous duplex negative, no SCDs, stop heparin drip as H&H is dropping.  8. History of CVA with left-sided hemiplegia - on statins, holding aspirin as H&H is dropping.  9.  Acute on chronic drop in H&H.  No evidence of active ongoing bleed however discontinue aspirin and heparin drip, IV PPI, type screen and monitor H&H.  Both of them to keep her hemoglobin around 7.5, son has consented for transfusion if needed.  Poor candidate for EGD with colonoscopy.  We will try to manage medically if possible.  If significant decline focus on comfort       Condition - Extremely Guarded  Family Communication  :  Almyra Free [phone] message left 12/11/20 at 8.36 am, 12/12/20  Code Status : DNR  Consults  :  Renal  PUD Prophylaxis : PPI   Procedures  :     VQ - No PE  TTE -  1. Left ventricular ejection fraction, by estimation, is 60 to 65%. The left ventricle has normal function. The left ventricle has no regional wall motion abnormalities. The left ventricular internal cavity size was mildly dilated. There is severe left ventricular hypertrophy. Left ventricular diastolic parameters are consistent with Grade II diastolic dysfunction (pseudonormalization). Elevated left ventricular end-diastolic pressure.  2. Right ventricular systolic  function is normal. The right ventricular size is normal. There is moderately elevated pulmonary artery systolic pressure.  3. Left atrial size was mildly dilated.  4. A small pericardial effusion is present. The pericardial effusion is localized near the right atrium.  5. Degree of MR worse compared to echo done 05/15/20 ? restricted posterior leaflet motion . The mitral valve is abnormal. Moderate to severe mitral valve regurgitation. No evidence of mitral stenosis.  6. The aortic valve was not well visualized. Aortic valve regurgitation is moderate. No aortic stenosis is present.  7. The inferior vena cava is normal in size with greater than 50% respiratory variability, suggesting right atrial pressure of 3 mmHg.  Leg [place] - No DVT      Disposition Plan  :    Status is: Inpatient  Remains inpatient appropriate because:IV treatments appropriate due to intensity of illness or inability to take PO   Dispo: The patient is from: Home              Anticipated d/c is to: Home              Patient currently is not medically stable to d/c.   Difficult to place patient No  DVT Prophylaxis  :   Heparin gtt  Lab Results  Component Value Date   PLT 244 12/12/2020    Diet :  Diet Order            Diet Heart Room service appropriate? Yes; Fluid consistency: Thin; Fluid restriction: 1200 mL Fluid  Diet effective now                  Inpatient Medications  Scheduled Meds: . atorvastatin  40 mg Oral Daily  . folic acid  1 mg Oral Daily  . isosorbide mononitrate  30 mg Oral Daily  .  memantine  10 mg Oral BID  . metoprolol tartrate  25 mg Oral BID  . pantoprazole (PROTONIX) IV  40 mg Intravenous Q12H  . senna-docusate  1 tablet Oral QHS  . thiamine  100 mg Oral Daily  . cyanocobalamin  1,000 mcg Oral Daily   Continuous Infusions:  PRN Meds:.acetaminophen **OR** acetaminophen, hydrALAZINE, ipratropium-albuterol  Antibiotics  :    Anti-infectives (From admission, onward)   None        Time Spent in minutes  30   Lala Lund M.D on 12/12/2020 at 8:45 AM  To page go to [email or web address]   [facility] -  Office  [phone]    See all Orders from today for further details    Objective:   Vitals:   12/11/20 1927 12/12/20 0329 12/12/20 0507 12/12/20 0754  BP: (!) 112/95 108/65  108/73  Pulse: 100 96  85  Resp: '20 20  18  '$ Temp: 98.2 F (36.8 C) 97.6 F (36.4 C)  98.2 F (36.8 C)  TempSrc: Oral Oral  Oral  SpO2: 98% 98%  98%  Weight:   50 kg   Height:        Wt Readings from Last 3 Encounters:  12/12/20 50 kg  09/23/20 48.8 kg  05/14/20 79.4 kg     Intake/Output Summary (Last 24 hours) at 12/12/2020 0845 Last data filed at 12/12/2020 [id number] Gross per 24 hour  Intake 457.23 ml  Output 700 ml  Net -242.77 ml     Physical Exam  Awake but confused, chronic left-sided weakness, anxious affect [place].AT,PERRAL Supple Neck,No JVD, No cervical lymphadenopathy appriciated.  Symmetrical Chest wall movement, Good air movement bilaterally, CTAB RRR,No Gallops, Rubs or new Murmurs, No Parasternal Heave +ve B.Sounds, Abd Soft, No tenderness, No organomegaly appriciated, No rebound - guarding or rigidity. No Cyanosis, Clubbing or edema, No new Rash or bruise     Data Review:    CBC Recent Labs  Lab 12/11/20 0115 12/11/20 0531 12/12/20 0321  WBC 16.9* 11.0* 24.8*  HGB 9.3* 9.0* 7.5*  HCT 30.0* 28.5* 23.4*  PLT 267 245 244  MCV 98.4 96.9 96.3  MCH 30.5 30.6 30.9  MCHC 31.0 31.6 32.1  RDW 13.6 13.6 14.0  LYMPHSABS 5.8* 0.4* 1.9  MONOABS 0.8 0.1 1.2*  EOSABS 0.6* 0.0 0.0  BASOSABS 0.1 0.0 0.0    Recent Labs  Lab 12/11/20 0115 12/11/20 0531 12/11/20 0904 12/12/20 0321  NA 140 139  --  138  K 3.6 3.6  --  4.7  CL 108 109  --  107  CO2 20* 18*  --  22  GLUCOSE 185* 250*  --  121*  BUN 54* 61*  --  84*  CREATININE 2.78* 3.07*  --  3.59*  CALCIUM 8.4* 8.6*  --  8.9  AST 22 23  --  18  ALT 12 12  --  11  ALKPHOS 70 74  --  61   BILITOT 0.5 0.6  --  0.5  ALBUMIN 3.2* 3.2*  --  3.0*  MG  --  2.5*  --  2.4  DDIMER 4.65*  --   --   --   PROCALCITON  --   --  0.79 2.00  TSH  --  5.905*  --   --   BNP 1,884.3*  --   --  3,368.1*    ------------------------------------------------------------------------------------------------------------------ No results for input(s): CHOL, HDL, LDLCALC, TRIG, CHOLHDL, LDLDIRECT in the last 72 hours.  Lab Results  Component Value  Date   HGBA1C 6.6 (H) 05/15/2020   ------------------------------------------------------------------------------------------------------------------   Micro Results Recent Results (from the past 240 hour(s))  SARS CORONAVIRUS 2 (TAT 6-24 HRS) Nasopharyngeal Nasopharyngeal Swab     Status: None   Collection Time: 12/11/20  4:07 AM   Specimen: Nasopharyngeal Swab  Result Value Ref Range Status   SARS Coronavirus 2 NEGATIVE NEGATIVE Final    Comment: (NOTE) SARS-CoV-2 target nucleic acids are NOT DETECTED.  The SARS-CoV-2 RNA is generally detectable in upper and lower respiratory specimens during the acute phase of infection. Negative results do not preclude SARS-CoV-2 infection, do not rule out co-infections with other pathogens, and should not be used as the sole basis for treatment or other patient management decisions. Negative results must be combined with clinical observations, patient history, and epidemiological information. The expected result is Negative.  Fact Sheet for Patients: SugarRoll.be  Fact Sheet for Healthcare Providers: https://www.woods-mathews.com/  This test is not yet approved or cleared by the [place] FDA and  has been authorized for detection and/or diagnosis of SARS-CoV-2 by FDA under an Emergency Use Authorization (EUA). This EUA will remain  in effect (meaning this test can be used) for the duration of the COVID-19 declaration under Se ction 564(b)(1) of the Act,  21 U.S.C. section 360bbb-3(b)(1), unless the authorization is terminated or revoked sooner.  Performed at [facility], [place] [address] [place], [place] [address]     Radiology Reports US RENAL  Result Date: 11/30/2020 CLINICAL DATA:  Chronic renal disease EXAM: RENAL / URINARY TRACT ULTRASOUND COMPLETE COMPARISON:  None. FINDINGS: Right Kidney: Renal measurements: 8 x 2.8 x 3.4 cm = volume: 39.2 mL. Increased cortical echogenicity. Left Kidney: Renal measurements: 7.8 x 3.5 x 4.0 cm = volume: 58.1 mL. Increased cortical echogenicity. Bladder: Appears normal for degree of bladder distention. Other: None. IMPRESSION: Increased cortical echogenicity bilaterally. The kidneys are smaller than normal. The findings are consistent with medical renal disease. Electronically Signed   By: Dorise Bullion III M.D   On: 11/30/2020 21:15   NM Pulmonary Perf and Vent  Result Date: 12/11/2020 CLINICAL DATA:  Dyspnea with elevated D-dimer EXAM: NUCLEAR MEDICINE PERFUSION LUNG SCAN TECHNIQUE: Perfusion images were obtained in multiple projections after intravenous injection of radiopharmaceutical. Views: Anterior, posterior, left lateral, right lateral, RPO, LPO, LAO, RAO RADIOPHARMACEUTICALS:  4.15 mCi Tc-44mMAA IV COMPARISON:  Chest radiograph December 11, 2020 FINDINGS: Radiotracer uptake bilaterally is homogeneous and symmetric. No perfusion defects evident. IMPRESSION: No perfusion defects evident on either side. No findings indicative of pulmonary embolus. Electronically Signed   By: WLowella GripIII M.D.   On: 12/11/2020 11:01   DG Chest Port 1 View  Result Date: 12/11/2020 CLINICAL DATA:  Dyspnea EXAM: PORTABLE CHEST 1 VIEW COMPARISON:  09/30/2020 FINDINGS: The lungs are symmetrically well expanded. Since the prior examination, there has developed trace diffuse interstitial pulmonary infiltrate, most in keeping with trace pulmonary edema, likely cardiogenic in nature. Cardiac size is mildly  enlarged. No pneumothorax or pleural effusion. No acute bone abnormality. IMPRESSION: Interval development of mild pulmonary edema, likely cardiogenic in nature. Electronically Signed   By: AFidela SalisburyMD   On: 12/11/2020 01:02   ECHOCARDIOGRAM COMPLETE  Result Date: 12/11/2020    ECHOCARDIOGRAM REPORT   Patient Name:   Melinda FAYEDate of Exam: 12/11/2020 Medical Rec #:  [id number]    Height:       60.0 in Accession #:    [id number]   Weight:  108.0 lb Date of Birth:  [birth year]     BSA:          1.437 m Patient Age:    16 years      BP:           117/84 mmHg Patient Gender: F             HR:           87 bpm. Exam Location:  Inpatient Procedure: 2D Echo Indications:    congestive heart failure  History:        Patient has prior history of Echocardiogram examinations, most                 recent 05/15/2020.  Sonographer:    Luisa Hart RDCS Referring Phys: [place]  1. Left ventricular ejection fraction, by estimation, is 60 to 65%. The left ventricle has normal function. The left ventricle has no regional wall motion abnormalities. The left ventricular internal cavity size was mildly dilated. There is severe left ventricular hypertrophy. Left ventricular diastolic parameters are consistent with Grade II diastolic dysfunction (pseudonormalization). Elevated left ventricular end-diastolic pressure.  2. Right ventricular systolic function is normal. The right ventricular size is normal. There is moderately elevated pulmonary artery systolic pressure.  3. Left atrial size was mildly dilated.  4. A small pericardial effusion is present. The pericardial effusion is localized near the right atrium.  5. Degree of MR worse compared to echo done 05/15/20 ? restricted posterior leaflet motion . The mitral valve is abnormal. Moderate to severe mitral valve regurgitation. No evidence of mitral stenosis.  6. The aortic valve was not well visualized. Aortic valve regurgitation is moderate.  No aortic stenosis is present.  7. The inferior vena cava is normal in size with greater than 50% respiratory variability, suggesting right atrial pressure of 3 mmHg. FINDINGS  Left Ventricle: Left ventricular ejection fraction, by estimation, is 60 to 65%. The left ventricle has normal function. The left ventricle has no regional wall motion abnormalities. The left ventricular internal cavity size was mildly dilated. There is  severe left ventricular hypertrophy. Left ventricular diastolic parameters are consistent with Grade II diastolic dysfunction (pseudonormalization). Elevated left ventricular end-diastolic pressure. Right Ventricle: The right ventricular size is normal. No increase in right ventricular wall thickness. Right ventricular systolic function is normal. There is moderately elevated pulmonary artery systolic pressure. The tricuspid regurgitant velocity is 3.31 m/s, and with an assumed right atrial pressure of 15 mmHg, the estimated right ventricular systolic pressure is 0000000 mmHg. Left Atrium: Left atrial size was mildly dilated. Right Atrium: Right atrial size was normal in size. Pericardium: A small pericardial effusion is present. The pericardial effusion is localized near the right atrium. Mitral Valve: Degree of MR worse compared to echo done 05/15/20 ? restricted posterior leaflet motion. The mitral valve is abnormal. There is mild thickening of the mitral valve leaflet(s). There is mild calcification of the mitral valve leaflet(s). Moderate to severe mitral valve regurgitation. No evidence of mitral valve stenosis. Tricuspid Valve: The tricuspid valve is normal in structure. Tricuspid valve regurgitation is mild . No evidence of tricuspid stenosis. Aortic Valve: The aortic valve was not well visualized. Aortic valve regurgitation is moderate. Aortic regurgitation PHT measures 231 msec. No aortic stenosis is present. Aortic valve mean gradient measures 5.0 mmHg. Aortic valve peak gradient  measures 7.4 mmHg. Aortic valve area, by VTI measures 2.20 cm. Pulmonic Valve: The pulmonic valve was normal in structure.  Pulmonic valve regurgitation is mild. No evidence of pulmonic stenosis. Aorta: The aortic root is normal in size and structure. Venous: The inferior vena cava is normal in size with greater than 50% respiratory variability, suggesting right atrial pressure of 3 mmHg. IAS/Shunts: No atrial level shunt detected by color flow Doppler.  LEFT VENTRICLE PLAX 2D LVIDd:         3.90 cm     Diastology LVIDs:         3.00 cm     LV e' medial:    3.05 cm/s LV PW:         1.60 cm     LV E/e' medial:  38.4 LV IVS:        1.80 cm     LV e' lateral:   2.39 cm/s LVOT diam:     2.00 cm     LV E/e' lateral: 49.0 LV SV:         59 LV SV Index:   41 LVOT Area:     3.14 cm  LV Volumes (MOD) LV vol d, MOD A2C: 73.9 ml LV vol d, MOD A4C: 74.5 ml LV vol s, MOD A2C: 37.2 ml LV vol s, MOD A4C: 42.7 ml LV SV MOD A2C:     36.7 ml LV SV MOD A4C:     74.5 ml LV SV MOD BP:      37.0 ml RIGHT VENTRICLE RV S prime:     12.20 cm/s  PULMONARY VEINS TAPSE (M-mode): 1.8 cm      A Reversal Duration: 103.00 msec                             A Reversal Velocity: 18.90 cm/s                             Diastolic Velocity:  0000000 cm/s                             S/D Velocity:        0.60                             Systolic Velocity:   [id number] cm/s LEFT ATRIUM             Index       RIGHT ATRIUM           Index LA diam:        4.10 cm 2.85 cm/m  RA Area:     10.30 cm LA Vol (A2C):   50.4 ml 35.08 ml/m RA Volume:   20.40 ml  14.20 ml/m LA Vol (A4C):   52.5 ml 36.54 ml/m LA Biplane Vol: 50.5 ml 35.15 ml/m  AORTIC VALVE                    PULMONIC VALVE AV Area (Vmax):    2.28 cm     PV Vmax:       0.82 m/s AV Area (Vmean):   2.19 cm     PV Vmean:      75.400 cm/s AV Area (VTI):     2.20 cm     PV VTI:        0.213 m AV Vmax:  136.00 cm/s  PV Peak grad:  2.7 mmHg AV Vmean:          102.000 cm/s PV Mean grad:  3.0 mmHg AV  VTI:            0.269 m AV Peak Grad:      7.4 mmHg AV Mean Grad:      5.0 mmHg LVOT Vmax:         98.90 cm/s LVOT Vmean:        71.100 cm/s LVOT VTI:          0.188 m LVOT/AV VTI ratio: 0.70 AI PHT:            231 msec  AORTA Ao Root diam: 3.30 cm Ao Asc diam:  2.60 cm MITRAL VALVE                 TRICUSPID VALVE MV Area (PHT): 5.54 cm      TR Peak grad:   43.8 mmHg MV Decel Time: 137 msec      TR Vmax:        331.00 cm/s MR Peak grad:    145.0 mmHg MR Mean grad:    98.0 mmHg   SHUNTS MR Vmax:         602.00 cm/s Systemic VTI:  0.19 m MR Vmean:        473.0 cm/s  Systemic Diam: 2.00 cm MR PISA:         2.26 cm MR PISA Eff ROA: 9 mm MR PISA Radius:  0.60 cm MV E velocity: 117.00 cm/s MV A velocity: 168.00 cm/s MV E/A ratio:  0.70 Jenkins Rouge MD Electronically signed by Jenkins Rouge MD Signature Date/Time: 12/11/2020/5:26:29 PM    Final    VAS [place] LOWER EXTREMITY VENOUS (DVT)  Result Date: 12/11/2020  Lower Venous DVT Study Indications: Elevated d-dimer, recent covid infection.  Comparison Study: No previous exams Performing Technologist: Rogelia Rohrer  Examination Guidelines: A complete evaluation includes B-mode imaging, spectral Doppler, color Doppler, and power Doppler as needed of all accessible portions of each vessel. Bilateral testing is considered an integral part of a complete examination. Limited examinations for reoccurring indications may be performed as noted. The reflux portion of the exam is performed with the patient in reverse Trendelenburg.  +---------+---------------+---------+-----------+----------+--------------+ RIGHT    CompressibilityPhasicitySpontaneityPropertiesThrombus Aging +---------+---------------+---------+-----------+----------+--------------+ CFV      Full           Yes      Yes                                 +---------+---------------+---------+-----------+----------+--------------+ SFJ      Full                                                         +---------+---------------+---------+-----------+----------+--------------+ FV Prox  Full           Yes      Yes                                 +---------+---------------+---------+-----------+----------+--------------+ FV Mid   Full           Yes      Yes                                 +---------+---------------+---------+-----------+----------+--------------+  FV DistalFull           Yes      Yes                                 +---------+---------------+---------+-----------+----------+--------------+ PFV      Full                                                        +---------+---------------+---------+-----------+----------+--------------+ POP      Full           Yes      Yes                                 +---------+---------------+---------+-----------+----------+--------------+ PTV      Full                                                        +---------+---------------+---------+-----------+----------+--------------+ PERO     Full                                                        +---------+---------------+---------+-----------+----------+--------------+   +---------+---------------+---------+-----------+----------+--------------+ LEFT     CompressibilityPhasicitySpontaneityPropertiesThrombus Aging +---------+---------------+---------+-----------+----------+--------------+ CFV      Full           Yes      Yes                                 +---------+---------------+---------+-----------+----------+--------------+ SFJ      Full                                                        +---------+---------------+---------+-----------+----------+--------------+ FV Prox  Full           Yes      Yes                                 +---------+---------------+---------+-----------+----------+--------------+ FV Mid   Full           Yes      Yes                                  +---------+---------------+---------+-----------+----------+--------------+ FV DistalFull           Yes      Yes                                 +---------+---------------+---------+-----------+----------+--------------+ PFV      Full                                                        +---------+---------------+---------+-----------+----------+--------------+  POP      Full           Yes      Yes                                 +---------+---------------+---------+-----------+----------+--------------+ PTV      Full                                                        +---------+---------------+---------+-----------+----------+--------------+ PERO     Full                                                        +---------+---------------+---------+-----------+----------+--------------+     Summary: BILATERAL: - No evidence of deep vein thrombosis seen in the lower extremities, bilaterally. - No evidence of superficial venous thrombosis in the lower extremities, bilaterally. -No evidence of popliteal cyst, bilaterally.   *See table(s) above for measurements and observations. Electronically signed by Harold Barban MD on 12/11/2020 at 10:42:39 PM.    Final

## 2020-12-12 NOTE — Progress Notes (Signed)
[facility] night shift.  The nursing staff reports that the patient is dyspneic with bibasilar crackles and bilateral wheezing.  She had a 2 PRBC transfusion earlier and received 40 mg of furosemide IVP during the transfusion.  She has had very good urinary output since then, but her creatinine level/GFR have been worsening with each subsequent measurement.  I will defer further diuretics at this time, but have asked the staff to provide the patient a DuoNeb now.  I will also add Nitropaste and 2 mg of morphine IVP for volume overload symptoms.  Tennis Must, MD.

## 2020-12-12 NOTE — Progress Notes (Signed)
Heart Failure Navigator Progress Note  Assessed for Heart & Vascular TOC clinic readiness.  Unfortunately at this time the patient does not meet criteria due to worsening renal function, SCr >3.5.   Navigator available for reassessment of patient.   Pricilla Holm, RN, BSN Heart Failure Nurse Navigator [phone]

## 2020-12-12 NOTE — Progress Notes (Signed)
[place] for Heparin Indication: chest pain/ACS  No Known Allergies  Patient Measurements: Height: 5' (152.4 cm) Weight: 50 kg (110 lb 3.7 oz) IBW/kg (Calculated) : 45.5 Heparin Dosing Weight: 45.5 kg  Vital Signs: Temp: 98.2 F (36.8 C) (03/31 0754) Temp Source: Oral (03/31 0754) BP: 108/73 (03/31 0754) Pulse Rate: 85 (03/31 0754)  Labs: Recent Labs    12/11/20 0115 12/11/20 0407 12/11/20 0531 12/11/20 1140 12/11/20 1535 12/11/20 2201 12/12/20 0321  HGB 9.3*  --  9.0*  --   --   --  7.5*  HCT 30.0*  --  28.5*  --   --   --  23.4*  PLT 267  --  245  --   --   --  244  HEPARINUNFRC  --   --   --  0.24*  --  0.11* 0.29*  CREATININE 2.78*  --  3.07*  --   --   --  3.59*  TROPONINIHS 94* 166* 201*  --  141*  --   --     Estimated Creatinine Clearance: 9.6 mL/min (A) (by C-G formula based on SCr of 3.59 mg/dL (H)).   Medical History: Past Medical History:  Diagnosis Date  . Acute heart failure ([place])   . Dementia ([place])   . Hypertension   . Stroke ([place]) 05/13/2020  . UTI (urinary tract infection) 09/2020    Assessment: 75 y.o. F presents with SOB and elevated troponin. D-dimer high at 4.65. Began IV heparin for r/o ACS and r/o VTE (now ruled out). No anticoagulation PTA.  3/30  BLE venous duplex: negative 3/30 V/Q - negative 3/30 Echo:  EF 60-65%  3/31: Heparin level 0.29, just below goal on Heparin drip  800 ut/hr Hgb 9.0>7.5, pltc wnl No bleeding or issues with infusion per discussion with RN.  Goal of Therapy:  Heparin level 0.3-0.7 units/ml Monitor platelets by anticoagulation protocol: Yes   Plan:  Increase heparin drip to 900 units/hr Check 8 hour heparin level Monitor daily heparin level and CBC, s/sx bleeding    Nicole Cella, RPh Clinical Pharmacist [phone] Please check AMION for all [place] phone numbers After 10:00 PM, call [place] [phone] 12/12/2020 8:02 AM

## 2020-12-12 NOTE — Evaluation (Signed)
Occupational Therapy Evaluation Patient Details Name: Melinda Olson MRN: [id number] DOB: 11/23/1945 Today's Date: 12/12/2020    History of Present Illness 75 y.o. 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  Presented acutely short of breath.   Clinical Impression   Patient admitted for the diagnosis above.  PTA is appears she needed assist with ADL, meds, mobility and community mobility.  Deficits are listed below, she is most likely close to her baseline, but acute OT will follow in the acute setting to maximize her functional status for an eventual return home.  No post acute OT is recommended at this time.  Assist as needed from family 24/7.      Follow Up Recommendations  No OT follow up    Equipment Recommendations  None recommended by OT    Recommendations for Other Services       Precautions / Restrictions Precautions Precautions: Fall Precaution Comments: follows simple commands and appears to answer appropriately to yes/no questions. Restrictions Weight Bearing Restrictions: No Other Position/Activity Restrictions: hunched position for stand pivot.  Balance back on heels.      Mobility Bed Mobility Overal bed mobility: Needs Assistance Bed Mobility: Supine to Sit     Supine to sit: Mod assist     General bed mobility comments: assist with feet oob, trunk to sit, and to scoot to edge. Patient Response: Flat affect  Transfers Overall transfer level: Needs assistance   Transfers: Sit to/from Stand;Stand Pivot Transfers Sit to Stand: Min assist Stand pivot transfers: Min assist            Balance Overall balance assessment: Needs assistance Sitting-balance support: No upper extremity supported;Feet supported Sitting balance-Leahy Scale: Fair Sitting balance - Comments: cues to lean forward   Standing balance support: Bilateral upper extremity  supported Standing balance-Leahy Scale: Poor Standing balance comment: needs external support                           ADL either performed or assessed with clinical judgement   ADL Overall ADL's : Needs assistance/impaired     Grooming: Wash/dry hands;Wash/dry face;Sitting;Minimal assistance           Upper Body Dressing : Minimal assistance;Moderate assistance;Sitting   Lower Body Dressing: Moderate assistance;Maximal assistance;Sitting/lateral leans                       Vision Patient Visual Report: No change from baseline       Perception     Praxis      Pertinent Vitals/Pain Pain Assessment: No/denies pain     Hand Dominance Right   Extremity/Trunk Assessment Upper Extremity Assessment Upper Extremity Assessment: Generalized weakness   Lower Extremity Assessment Lower Extremity Assessment: Defer to PT evaluation   Cervical / Trunk Assessment Cervical / Trunk Assessment: Kyphotic   Communication Communication Communication: Expressive difficulties   Cognition Arousal/Alertness: Awake/alert Behavior During Therapy: WFL for tasks assessed/performed Overall Cognitive Status: History of cognitive impairments - at baseline                                 General Comments: follows simple commands.  Knew her DOB, disoriented to place, time, and situation.   General Comments       Exercises     Shoulder Instructions      Home  Living Family/patient expects to be discharged to:: Private residence Living Arrangements: Spouse/significant other;Children Available Help at Discharge: Family;Available 24 hours/day Type of Home: House Home Access: Ramped entrance     Home Layout: One level     Bathroom Shower/Tub: Teacher, early years/pre: Handicapped height Bathroom Accessibility: Yes How Accessible: Accessible via walker Home Equipment: [place] - 4 wheels;Wheelchair - manual;Shower seat   Additional  Comments: information taken from previous note as pt with baseline cognitive dysfunction, but patient was pretty close with details.  She stated 2 STE.      Prior Functioning/Environment Level of Independence: Needs assistance  Gait / Transfers Assistance Needed: Pt reports she has been using WC and has a RW. ADL's / Homemaking Assistance Needed: requries assistance for most ADLs including bathing and dressing per previous notes.  She stated she had a "nurse" that comes to help her.  Also stated her DIL helps her with toileting and bathing. Communication / Swallowing Assistance Needed: Mild Choking noted with sip of water with straw.          OT Problem List: Decreased strength;Decreased activity tolerance;Impaired balance (sitting and/or standing);Decreased safety awareness      OT Treatment/Interventions: Self-care/ADL training;Balance training;Therapeutic activities    OT Goals(Current goals can be found in the care plan section) Acute Rehab OT Goals Patient Stated Goal: na OT Goal Formulation: Patient unable to participate in goal setting Time For Goal Achievement: 12/26/20 Potential to Achieve Goals: Fair ADL Goals Pt Will Perform Grooming: with supervision;sitting Pt Will Perform Upper Body Bathing: with supervision;sitting Pt Will Perform Upper Body Dressing: with supervision;sitting Pt Will Transfer to Toilet: stand pivot transfer;bedside commode;with min guard assist Pt Will Perform Toileting - Clothing Manipulation and hygiene: sit to/from stand;with min guard assist  OT Frequency: Min 2X/week   Barriers to D/C:    none noted       Co-evaluation              AM-PAC OT "6 Clicks" Daily Activity     Outcome Measure Help from another person eating meals?: A Little Help from another person taking care of personal grooming?: A Little Help from another person toileting, which includes using toliet, bedpan, or urinal?: A Lot Help from another person bathing  (including washing, rinsing, drying)?: A Lot Help from another person to put on and taking off regular upper body clothing?: A Lot Help from another person to put on and taking off regular lower body clothing?: A Lot 6 Click Score: 14   End of Session Equipment Utilized During Treatment: Oxygen Nurse Communication: Mobility status  Activity Tolerance: Patient tolerated treatment well Patient left: in chair;with call bell/phone within reach;with chair alarm set  OT Visit Diagnosis: Unsteadiness on feet (R26.81);Muscle weakness (generalized) (M62.81);Other symptoms and signs involving cognitive function;Dizziness and giddiness (R42)                Time: 1355-1413 OT Time Calculation (min): 18 min Charges:  OT General Charges $OT Visit: 1 Visit OT Evaluation $OT Eval Moderate Complexity: 1 Mod  12/12/2020  Rich, OTR/L  Acute Rehabilitation Services  Office:  [place] 12/12/2020, 2:58 PM

## 2020-12-12 NOTE — Progress Notes (Signed)
[facility] Progress Note   Subjective:   Dyspnea overnight with wheezing - duoneb; CXR this AM with pulmonary edema.  I/Os 450 / 450 yesterday.  UOP 262m as of this AM; has purewick.  She doesn't have particular complaint this AM. BPs 100/60-70s this AM.   Objective Vitals:   12/11/20 1927 12/12/20 0329 12/12/20 0507 12/12/20 0754  BP: (!) 112/95 108/65  108/73  Pulse: 100 96  85  Resp: '20 20  18  '$ Temp: 98.2 F (36.8 C) 97.6 F (36.4 C)  98.2 F (36.8 C)  TempSrc: Oral Oral  Oral  SpO2: 98% 98%  98%  Weight:   50 kg   Height:       Physical Exam General: chronically ill lying at 45 deg in bed Heart: RRR, no rub Lungs: rales in bases still Abdomen: soft, nontender Extremities: no edema Neuro: oriented to self and hospital; guesses at year, slow to respond and doesn't know details  Additional Objective Labs: Basic Metabolic Panel: Recent Labs  Lab 12/11/20 0115 12/11/20 0531 12/12/20 0321  NA 140 139 138  K 3.6 3.6 4.7  CL 108 109 107  CO2 20* 18* 22  GLUCOSE 185* 250* 121*  BUN 54* 61* 84*  CREATININE 2.78* 3.07* 3.59*  CALCIUM 8.4* 8.6* 8.9   Liver Function Tests: Recent Labs  Lab 12/11/20 0115 12/11/20 0531 12/12/20 0321  AST '22 23 18  '$ ALT '12 12 11  '$ ALKPHOS 70 74 61  BILITOT 0.5 0.6 0.5  PROT 6.0* 6.2* 5.8*  ALBUMIN 3.2* 3.2* 3.0*   No results for input(s): LIPASE, AMYLASE in the last 168 hours. CBC: Recent Labs  Lab 12/11/20 0115 12/11/20 0531 12/12/20 0321  WBC 16.9* 11.0* 24.8*  NEUTROABS 9.5* 10.4* 21.4*  HGB 9.3* 9.0* 7.5*  HCT 30.0* 28.5* 23.4*  MCV 98.4 96.9 96.3  PLT 267 245 244   Blood Culture    Component Value Date/Time   SDES URINE, RANDOM 10/01/2020 1547   SPECREQUEST  10/01/2020 1547    NONE Performed at [facility] [address], [place] [address]   CULT (A) 10/01/2020 1547    >=100,000 COLONIES/mL ESCHERICHIA COLI Confirmed Extended Spectrum Beta-Lactamase Producer (ESBL).  In  bloodstream infections from ESBL organisms, carbapenems are preferred over piperacillin/tazobactam. They are shown to have a lower risk of mortality.    REPTSTATUS 10/04/2020 FINAL 10/01/2020 1547    Cardiac Enzymes: No results for input(s): CKTOTAL, CKMB, CKMBINDEX, TROPONINI in the last 168 hours. CBG: Recent Labs  Lab 12/12/20 0628  GLUCAP 207*   Iron Studies: No results for input(s): IRON, TIBC, TRANSFERRIN, FERRITIN in the last 72 hours. '@lablastinr3'$ @ Studies/Results: NM Pulmonary Perf and Vent  Result Date: 12/11/2020 CLINICAL DATA:  Dyspnea with elevated D-dimer EXAM: NUCLEAR MEDICINE PERFUSION LUNG SCAN TECHNIQUE: Perfusion images were obtained in multiple projections after intravenous injection of radiopharmaceutical. Views: Anterior, posterior, left lateral, right lateral, RPO, LPO, LAO, RAO RADIOPHARMACEUTICALS:  4.15 mCi Tc-971mAA IV COMPARISON:  Chest radiograph December 11, 2020 FINDINGS: Radiotracer uptake bilaterally is homogeneous and symmetric. No perfusion defects evident. IMPRESSION: No perfusion defects evident on either side. No findings indicative of pulmonary embolus. Electronically Signed   By: WiLowella GripII M.D.   On: 12/11/2020 11:01   DG Chest Port 1 View  Result Date: 12/12/2020 CLINICAL DATA:  Shortness of breath. EXAM: PORTABLE CHEST 1 VIEW COMPARISON:  Chest x-ray 02/10/2021. FINDINGS: Cardiomegaly. Diffuse bilateral pulmonary infiltrates consistent with pulmonary edema. Findings consistent  CHF. Tiny bilateral pleural effusions may be present. No pneumothorax. Aortic atherosclerotic vascular calcification. IMPRESSION: Cardiomegaly with diffuse bilateral pulmonary infiltrates consistent with pulmonary edema. Findings consistent CHF. Tiny bilateral pleural effusions may be present. Electronically Signed   By: Marcello Moores  Register   On: 12/12/2020 09:07   DG Chest Port 1 View  Result Date: 12/11/2020 CLINICAL DATA:  Dyspnea EXAM: PORTABLE CHEST 1 VIEW  COMPARISON:  09/30/2020 FINDINGS: The lungs are symmetrically well expanded. Since the prior examination, there has developed trace diffuse interstitial pulmonary infiltrate, most in keeping with trace pulmonary edema, likely cardiogenic in nature. Cardiac size is mildly enlarged. No pneumothorax or pleural effusion. No acute bone abnormality. IMPRESSION: Interval development of mild pulmonary edema, likely cardiogenic in nature. Electronically Signed   By: Fidela Salisbury MD   On: 12/11/2020 01:02   ECHOCARDIOGRAM COMPLETE  Result Date: 12/11/2020    ECHOCARDIOGRAM REPORT   Patient Name:   Melinda Olson Date of Exam: 12/11/2020 Medical Rec #:  [id number]     Height:       60.0 in Accession #:    [id number]    Weight:       108.0 lb Date of Birth:  10-Jan-1946     BSA:          1.437 m Patient Age:    75 years      BP:           117/84 mmHg Patient Gender: F             HR:           87 bpm. Exam Location:  Inpatient Procedure: 2D Echo Indications:    congestive heart failure  History:        Patient has prior history of Echocardiogram examinations, most                 recent 05/15/2020.  Sonographer:    Luisa Hart RDCS Referring Phys: Carey  1. Left ventricular ejection fraction, by estimation, is 60 to 65%. The left ventricle has normal function. The left ventricle has no regional wall motion abnormalities. The left ventricular internal cavity size was mildly dilated. There is severe left ventricular hypertrophy. Left ventricular diastolic parameters are consistent with Grade II diastolic dysfunction (pseudonormalization). Elevated left ventricular end-diastolic pressure.  2. Right ventricular systolic function is normal. The right ventricular size is normal. There is moderately elevated pulmonary artery systolic pressure.  3. Left atrial size was mildly dilated.  4. A small pericardial effusion is present. The pericardial effusion is localized near the right atrium.  5. Degree of  MR worse compared to echo done 05/15/20 ? restricted posterior leaflet motion . The mitral valve is abnormal. Moderate to severe mitral valve regurgitation. No evidence of mitral stenosis.  6. The aortic valve was not well visualized. Aortic valve regurgitation is moderate. No aortic stenosis is present.  7. The inferior vena cava is normal in size with greater than 50% respiratory variability, suggesting right atrial pressure of 3 mmHg. FINDINGS  Left Ventricle: Left ventricular ejection fraction, by estimation, is 60 to 65%. The left ventricle has normal function. The left ventricle has no regional wall motion abnormalities. The left ventricular internal cavity size was mildly dilated. There is  severe left ventricular hypertrophy. Left ventricular diastolic parameters are consistent with Grade II diastolic dysfunction (pseudonormalization). Elevated left ventricular end-diastolic pressure. Right Ventricle: The right ventricular size is normal. No increase in right ventricular wall thickness. Right  ventricular systolic function is normal. There is moderately elevated pulmonary artery systolic pressure. The tricuspid regurgitant velocity is 3.31 m/s, and with an assumed right atrial pressure of 15 mmHg, the estimated right ventricular systolic pressure is 0000000 mmHg. Left Atrium: Left atrial size was mildly dilated. Right Atrium: Right atrial size was normal in size. Pericardium: A small pericardial effusion is present. The pericardial effusion is localized near the right atrium. Mitral Valve: Degree of MR worse compared to echo done 05/15/20 ? restricted posterior leaflet motion. The mitral valve is abnormal. There is mild thickening of the mitral valve leaflet(s). There is mild calcification of the mitral valve leaflet(s). Moderate to severe mitral valve regurgitation. No evidence of mitral valve stenosis. Tricuspid Valve: The tricuspid valve is normal in structure. Tricuspid valve regurgitation is mild . No evidence  of tricuspid stenosis. Aortic Valve: The aortic valve was not well visualized. Aortic valve regurgitation is moderate. Aortic regurgitation PHT measures 231 msec. No aortic stenosis is present. Aortic valve mean gradient measures 5.0 mmHg. Aortic valve peak gradient measures 7.4 mmHg. Aortic valve area, by VTI measures 2.20 cm. Pulmonic Valve: The pulmonic valve was normal in structure. Pulmonic valve regurgitation is mild. No evidence of pulmonic stenosis. Aorta: The aortic root is normal in size and structure. Venous: The inferior vena cava is normal in size with greater than 50% respiratory variability, suggesting right atrial pressure of 3 mmHg. IAS/Shunts: No atrial level shunt detected by color flow Doppler.  LEFT VENTRICLE PLAX 2D LVIDd:         3.90 cm     Diastology LVIDs:         3.00 cm     LV e' medial:    3.05 cm/s LV PW:         1.60 cm     LV E/e' medial:  38.4 LV IVS:        1.80 cm     LV e' lateral:   2.39 cm/s LVOT diam:     2.00 cm     LV E/e' lateral: 49.0 LV SV:         59 LV SV Index:   41 LVOT Area:     3.14 cm  LV Volumes (MOD) LV vol d, MOD A2C: 73.9 ml LV vol d, MOD A4C: 74.5 ml LV vol s, MOD A2C: 37.2 ml LV vol s, MOD A4C: 42.7 ml LV SV MOD A2C:     36.7 ml LV SV MOD A4C:     74.5 ml LV SV MOD BP:      37.0 ml RIGHT VENTRICLE RV S prime:     12.20 cm/s  PULMONARY VEINS TAPSE (M-mode): 1.8 cm      A Reversal Duration: 103.00 msec                             A Reversal Velocity: 18.90 cm/s                             Diastolic Velocity:  0000000 cm/s                             S/D Velocity:        0.60  Systolic Velocity:   [id number] cm/s LEFT ATRIUM             Index       RIGHT ATRIUM           Index LA diam:        4.10 cm 2.85 cm/m  RA Area:     10.30 cm LA Vol (A2C):   50.4 ml 35.08 ml/m RA Volume:   20.40 ml  14.20 ml/m LA Vol (A4C):   52.5 ml 36.54 ml/m LA Biplane Vol: 50.5 ml 35.15 ml/m  AORTIC VALVE                    PULMONIC VALVE AV Area (Vmax):     2.28 cm     PV Vmax:       0.82 m/s AV Area (Vmean):   2.19 cm     PV Vmean:      75.400 cm/s AV Area (VTI):     2.20 cm     PV VTI:        0.213 m AV Vmax:           136.00 cm/s  PV Peak grad:  2.7 mmHg AV Vmean:          102.000 cm/s PV Mean grad:  3.0 mmHg AV VTI:            0.269 m AV Peak Grad:      7.4 mmHg AV Mean Grad:      5.0 mmHg LVOT Vmax:         98.90 cm/s LVOT Vmean:        71.100 cm/s LVOT VTI:          0.188 m LVOT/AV VTI ratio: 0.70 AI PHT:            231 msec  AORTA Ao Root diam: 3.30 cm Ao Asc diam:  2.60 cm MITRAL VALVE                 TRICUSPID VALVE MV Area (PHT): 5.54 cm      TR Peak grad:   43.8 mmHg MV Decel Time: 137 msec      TR Vmax:        331.00 cm/s MR Peak grad:    145.0 mmHg MR Mean grad:    98.0 mmHg   SHUNTS MR Vmax:         602.00 cm/s Systemic VTI:  0.19 m MR Vmean:        473.0 cm/s  Systemic Diam: 2.00 cm MR PISA:         2.26 cm MR PISA Eff ROA: 9 mm MR PISA Radius:  0.60 cm MV E velocity: 117.00 cm/s MV A velocity: 168.00 cm/s MV E/A ratio:  0.70 Jenkins Rouge MD Electronically signed by Jenkins Rouge MD Signature Date/Time: 12/11/2020/5:26:29 PM    Final    VAS [place] LOWER EXTREMITY VENOUS (DVT)  Result Date: 12/11/2020  Lower Venous DVT Study Indications: Elevated d-dimer, recent covid infection.  Comparison Study: No previous exams Performing Technologist: Rogelia Rohrer  Examination Guidelines: A complete evaluation includes B-mode imaging, spectral Doppler, color Doppler, and power Doppler as needed of all accessible portions of each vessel. Bilateral testing is considered an integral part of a complete examination. Limited examinations for reoccurring indications may be performed as noted. The reflux portion of the exam is performed with the patient in reverse Trendelenburg.  +---------+---------------+---------+-----------+----------+--------------+ RIGHT    CompressibilityPhasicitySpontaneityPropertiesThrombus Aging  +---------+---------------+---------+-----------+----------+--------------+  CFV      Full           Yes      Yes                                 +---------+---------------+---------+-----------+----------+--------------+ SFJ      Full                                                        +---------+---------------+---------+-----------+----------+--------------+ FV Prox  Full           Yes      Yes                                 +---------+---------------+---------+-----------+----------+--------------+ FV Mid   Full           Yes      Yes                                 +---------+---------------+---------+-----------+----------+--------------+ FV DistalFull           Yes      Yes                                 +---------+---------------+---------+-----------+----------+--------------+ PFV      Full                                                        +---------+---------------+---------+-----------+----------+--------------+ POP      Full           Yes      Yes                                 +---------+---------------+---------+-----------+----------+--------------+ PTV      Full                                                        +---------+---------------+---------+-----------+----------+--------------+ PERO     Full                                                        +---------+---------------+---------+-----------+----------+--------------+   +---------+---------------+---------+-----------+----------+--------------+ LEFT     CompressibilityPhasicitySpontaneityPropertiesThrombus Aging +---------+---------------+---------+-----------+----------+--------------+ CFV      Full           Yes      Yes                                 +---------+---------------+---------+-----------+----------+--------------+ SFJ  Full                                                         +---------+---------------+---------+-----------+----------+--------------+ FV Prox  Full           Yes      Yes                                 +---------+---------------+---------+-----------+----------+--------------+ FV Mid   Full           Yes      Yes                                 +---------+---------------+---------+-----------+----------+--------------+ FV DistalFull           Yes      Yes                                 +---------+---------------+---------+-----------+----------+--------------+ PFV      Full                                                        +---------+---------------+---------+-----------+----------+--------------+ POP      Full           Yes      Yes                                 +---------+---------------+---------+-----------+----------+--------------+ PTV      Full                                                        +---------+---------------+---------+-----------+----------+--------------+ PERO     Full                                                        +---------+---------------+---------+-----------+----------+--------------+     Summary: BILATERAL: - No evidence of deep vein thrombosis seen in the lower extremities, bilaterally. - No evidence of superficial venous thrombosis in the lower extremities, bilaterally. -No evidence of popliteal cyst, bilaterally.   *See table(s) above for measurements and observations. Electronically signed by Harold Barban MD on 12/11/2020 at 10:42:39 PM.    Final    Medications:  . atorvastatin  40 mg Oral Daily  . folic acid  1 mg Oral Daily  . isosorbide mononitrate  30 mg Oral Daily  . memantine  10 mg Oral BID  . metoprolol tartrate  25 mg Oral BID  . pantoprazole (PROTONIX) IV  40 mg Intravenous Q12H  . senna-docusate  1 tablet Oral QHS  . thiamine  100 mg Oral Daily  .  cyanocobalamin  1,000 mcg Oral Daily    Assessment/Plan **Acute hypoxic respiratory failure: CXR with  pulmonary edema with BP > 200.  Improved with diuresis.  CXR still with edema today but in light of clinical improvement and worsening renal function will hold on diuresis at this moment.  VQ low risk for PE, LE duplex neg DVT.  TTE normal EF, grade 2 DD, IVC collapses normally.    **AKI on CKD:  Quite labile renal function lately but near recent baseline in the mid 2s.  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  Cr is up from 2.8 to 3 to 3.6 today after presenting with SBP > 200 and then being diuresed.  It appears she's quite sensitive to hemodynamic shifts.  Per above hold on more diuresis right now. Recent renal US with CKD; no need to repeat, bladder scan ok.  Will follow closely.  I had a nice conversation with son yesterday about where things stand - manage medically but not advance to dialysis if develops renal failure; son in agreement. Cont course for now. Avoid nephrotoxin, avoid hypotension, avoid hypovolemia.   **HTN urgency: SBP > 200 on presentation.  Now improved with BPs in the 100-120s.  Has been started onon imdur 30 and metoprolol 25 BID.  Of note her clinic BP recently was low and amlodipine stopped; it was fine on [facility] meaurements subsequently.   **Anemia:  Hb 9.3 on admit now 7.5. no obvious bleeding. Heparin gtt and ASA held today.    **Dementia: on namenda  **Leukocytosis: afebrile and no localizing s/s infection; per primary.   Will follow, page with questions.   Jannifer Hick MD 12/12/2020, 10:32 AM  [facility] Pager: [phone]

## 2020-12-12 NOTE — Progress Notes (Signed)
Pt having shortness of breath, expiratory wheezing, Dr. Olevia Bowens notified, duoneb given, med was effective.

## 2020-12-12 NOTE — Evaluation (Signed)
Physical Therapy Evaluation Patient Details Name: Melinda Olson MRN: [id number] DOB: 28-Aug-1946 Today's Date: 12/12/2020   History of Present Illness  Pt is a 75 y.o. female admitted 12/11/20 with SOB. Workup for acute hypoxic respiratory failure due to acute on chronic HF, AKI on CKD. 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    Clinical Impression  Pt presents with an overall decrease in functional mobility secondary to above. PTA, pt requires assist for limited mobility with w/c and RW, family and "nurse" assist with all ADL tasks. Today, pt requiring min-modA for bed mobility and standing; pt with difficulty initiating and sequencing steps with LLE. Pt also limited by fatigue, about to receive another PRBC unit. Pt would benefit from continued acute PT services to maximize functional mobility and independence prior to return home.     Follow Up Recommendations No PT follow up;Supervision for mobility/OOB    Equipment Recommendations  None recommended by PT    Recommendations for Other Services       Precautions / Restrictions Precautions Precautions: Fall;Other (comment) Precaution Comments: Urine incontinence; flexed posture Restrictions Weight Bearing Restrictions: No Other Position/Activity Restrictions: hunched position for stand pivot.  Balance back on heels.      Mobility  Bed Mobility Overal bed mobility: Needs Assistance Bed Mobility: Supine to Sit;Sit to Supine     Supine to sit: Mod assist Sit to supine: Mod assist   General bed mobility comments: Pt initiating movement well, cues to use bed rail, modA to assist hips to EOB, minA for trunk elevation; modA for BLE management return to supine. Pt prefers R side-lying with flexed posture (UEs/LEs/trunk) - blanket placed between knees    Transfers Overall transfer level: Needs assistance Equipment used: 1 person hand held assist Transfers: Sit  to/from Stand Sit to Stand: Min assist;Mod assist Stand pivot transfers: Min assist       General transfer comment: Initial minA with HHA to elevate trunk, pt bracing BLEs against EOB, attempting steps but unable to take step with LLE; additional standing 2x trials requiring modA for trunk elevation and stability  Ambulation/Gait             General Gait Details: Attempted side steps towards HOB, pt able to take step with RLE but difficulty sequencing with L foot requiring assist to move leg  Stairs            Wheelchair Mobility    Modified Rankin (Stroke Patients Only)       Balance Overall balance assessment: Needs assistance Sitting-balance support: No upper extremity supported;Feet supported Sitting balance-Leahy Scale: Fair Sitting balance - Comments: cues to lean forward   Standing balance support: Single extremity supported Standing balance-Leahy Scale: Poor Standing balance comment: Reliant on UE support and external assist                             Pertinent Vitals/Pain Pain Assessment: No/denies pain    Home Living Family/patient expects to be discharged to:: Private residence Living Arrangements: Spouse/significant other;Children Available Help at Discharge: Family;Available 24 hours/day Type of Home: House Home Access: Ramped entrance     Home Layout: One level Home Equipment: Walker - 4 wheels;Wheelchair - manual;Shower seat Additional Comments: information taken from previous note as pt with baseline cognitive dysfunction, but patient was pretty close with details.  She stated 2 steps    Prior Function Level of Independence: Needs  assistance   Gait / Transfers Assistance Needed: Pt reports she has been using WC and has a RW.  ADL's / Homemaking Assistance Needed: requries assistance for most ADLs including bathing and dressing per previous notes.  She stated she had a "nurse" that comes to help her.  Also stated her DIL helps  her with toileting and bathing.        Hand Dominance   Dominant Hand: Right    Extremity/Trunk Assessment   Upper Extremity Assessment Upper Extremity Assessment: Generalized weakness    Lower Extremity Assessment Lower Extremity Assessment: RLE deficits/detail;LLE deficits/detail RLE Deficits / Details: Hip and knee flexion contractures notes; able to flex/ext hip and knee through partial range RLE Coordination: decreased gross motor;decreased fine motor LLE Deficits / Details: Hip and knee flexion contractures notes; able to flex/ext hip and knee through partial range LLE Coordination: decreased gross motor;decreased fine motor    Cervical / Trunk Assessment Cervical / Trunk Assessment: Kyphotic  Communication   Communication: Expressive difficulties  Cognition Arousal/Alertness: Awake/alert Behavior During Therapy: Flat affect Overall Cognitive Status: History of cognitive impairments - at baseline                                 General Comments: Oriented to self, Alexander and March - cues for year. Following majority of simple commands. Pt laughing inappropriately at times; per chart, h/o emotional lability and memory deficits      General Comments General comments (skin integrity, edema, etc.): SpO2 down to 86% on RA, 90-98% on 2L O2; HR 76    Exercises General Exercises - Lower Extremity Ankle Circles/Pumps: AROM;Both (limited) Long Arc Quad: AROM;Both;Seated (unable to achieve fully extension; flexing hip with end range)   Assessment/Plan    PT Assessment Patient needs continued PT services  PT Problem List Decreased strength;Decreased range of motion;Decreased activity tolerance;Decreased balance;Decreased mobility;Decreased cognition;Decreased knowledge of use of DME;Cardiopulmonary status limiting activity;Impaired tone       PT Treatment Interventions DME instruction;Gait training;Functional mobility training;Therapeutic  activities;Therapeutic exercise;Balance training;Cognitive remediation;Patient/family education;Wheelchair mobility training    PT Goals (Current goals can be found in the Care Plan section)  Acute Rehab PT Goals Patient Stated Goal: Lay back down; enjoys playing cards PT Goal Formulation: With patient Time For Goal Achievement: 12/26/20 Potential to Achieve Goals: Fair    Frequency Min 3X/week   Barriers to discharge        Co-evaluation               AM-PAC PT "6 Clicks" Mobility  Outcome Measure Help needed turning from your back to your side while in a flat bed without using bedrails?: A Lot Help needed moving from lying on your back to sitting on the side of a flat bed without using bedrails?: A Lot Help needed moving to and from a bed to a chair (including a wheelchair)?: A Lot Help needed standing up from a chair using your arms (e.g., wheelchair or bedside chair)?: A Lot Help needed to walk in hospital room?: A Lot Help needed climbing 3-5 steps with a railing? : Total 6 Click Score: 11    End of Session   Activity Tolerance: Patient tolerated treatment well;Patient limited by fatigue (pt about to receive another unit of blood) Patient left: in bed Nurse Communication: Mobility status PT Visit Diagnosis: Other abnormalities of gait and mobility (R26.89);Muscle weakness (generalized) (M62.81)    Time: [id number] PT Time  Calculation (min) (ACUTE ONLY): 17 min   Charges:   PT Evaluation $PT Eval Moderate Complexity: [place], PT, DPT Acute Rehabilitation Services  Pager [phone] Office [place] 12/12/2020, 5:17 PM

## 2020-12-12 NOTE — Progress Notes (Signed)
Dr Olevia Bowens paged and notified of patient wheezing and auscultation of crackles post 2nd unit of RBC's Order to give Neb treatment. Orders placed for nitro paste. Will initiate orders and continue to monitor patient.

## 2020-12-13 ENCOUNTER — Inpatient Hospital Stay (HOSPITAL_COMMUNITY): Payer: Medicare Other

## 2020-12-13 LAB — TYPE AND SCREEN
ABO/RH(D): A POS
Antibody Screen: NEGATIVE
Unit division: 0
Unit division: 0

## 2020-12-13 LAB — CBC WITH DIFFERENTIAL/PLATELET
Abs Immature Granulocytes: 0.08 10*3/uL — ABNORMAL HIGH (ref 0.00–0.07)
Basophils Absolute: 0.1 10*3/uL (ref 0.0–0.1)
Basophils Relative: 1 %
Eosinophils Absolute: 0.2 10*3/uL (ref 0.0–0.5)
Eosinophils Relative: 1 %
HCT: 31.1 % — ABNORMAL LOW (ref 36.0–46.0)
Hemoglobin: 10.4 g/dL — ABNORMAL LOW (ref 12.0–15.0)
Immature Granulocytes: 1 %
Lymphocytes Relative: 21 %
Lymphs Abs: 2.7 10*3/uL (ref 0.7–4.0)
MCH: 31 pg (ref 26.0–34.0)
MCHC: 33.4 g/dL (ref 30.0–36.0)
MCV: 92.6 fL (ref 80.0–100.0)
Monocytes Absolute: 0.9 10*3/uL (ref 0.1–1.0)
Monocytes Relative: 7 %
Neutro Abs: 9.2 10*3/uL — ABNORMAL HIGH (ref 1.7–7.7)
Neutrophils Relative %: 69 %
Platelets: 215 10*3/uL (ref 150–400)
RBC: 3.36 MIL/uL — ABNORMAL LOW (ref 3.87–5.11)
RDW: 14.8 % (ref 11.5–15.5)
WBC: 13.1 10*3/uL — ABNORMAL HIGH (ref 4.0–10.5)
nRBC: 0 % (ref 0.0–0.2)

## 2020-12-13 LAB — COMPREHENSIVE METABOLIC PANEL
ALT: 11 U/L (ref 0–44)
AST: 16 U/L (ref 15–41)
Albumin: 3 g/dL — ABNORMAL LOW (ref 3.5–5.0)
Alkaline Phosphatase: 66 U/L (ref 38–126)
Anion gap: 10 (ref 5–15)
BUN: 88 mg/dL — ABNORMAL HIGH (ref 8–23)
CO2: 22 mmol/L (ref 22–32)
Calcium: 8.6 mg/dL — ABNORMAL LOW (ref 8.9–10.3)
Chloride: 108 mmol/L (ref 98–111)
Creatinine, Ser: 3.99 mg/dL — ABNORMAL HIGH (ref 0.44–1.00)
GFR, Estimated: 11 mL/min — ABNORMAL LOW (ref >60)
Glucose, Bld: 94 mg/dL (ref 70–99)
Potassium: 4.4 mmol/L (ref 3.5–5.1)
Sodium: 140 mmol/L (ref 135–145)
Total Bilirubin: 1 mg/dL (ref 0.3–1.2)
Total Protein: 5.7 g/dL — ABNORMAL LOW (ref 6.5–8.1)

## 2020-12-13 LAB — URINE CULTURE

## 2020-12-13 LAB — BPAM RBC
Blood Product Expiration Date: 202204252359
Blood Product Expiration Date: 202204262359
ISSUE DATE / TIME: 202203311256
ISSUE DATE / TIME: 202203311723
Unit Type and Rh: 6200
Unit Type and Rh: 6200

## 2020-12-13 LAB — BRAIN NATRIURETIC PEPTIDE: B Natriuretic Peptide: 2025.2 pg/mL — ABNORMAL HIGH (ref 0.0–100.0)

## 2020-12-13 LAB — PROCALCITONIN: Procalcitonin: 1.92 ng/mL

## 2020-12-13 LAB — MAGNESIUM: Magnesium: 2.4 mg/dL (ref 1.7–2.4)

## 2020-12-13 IMAGING — DX DG CHEST 1V PORT
1 series · 1 of 1 positions shown · non-contrast
Comparison: [DATE]

CLINICAL DATA: Shortness of breath

Wheezing
EXAM:
PORTABLE CHEST 1 VIEW

[chest]
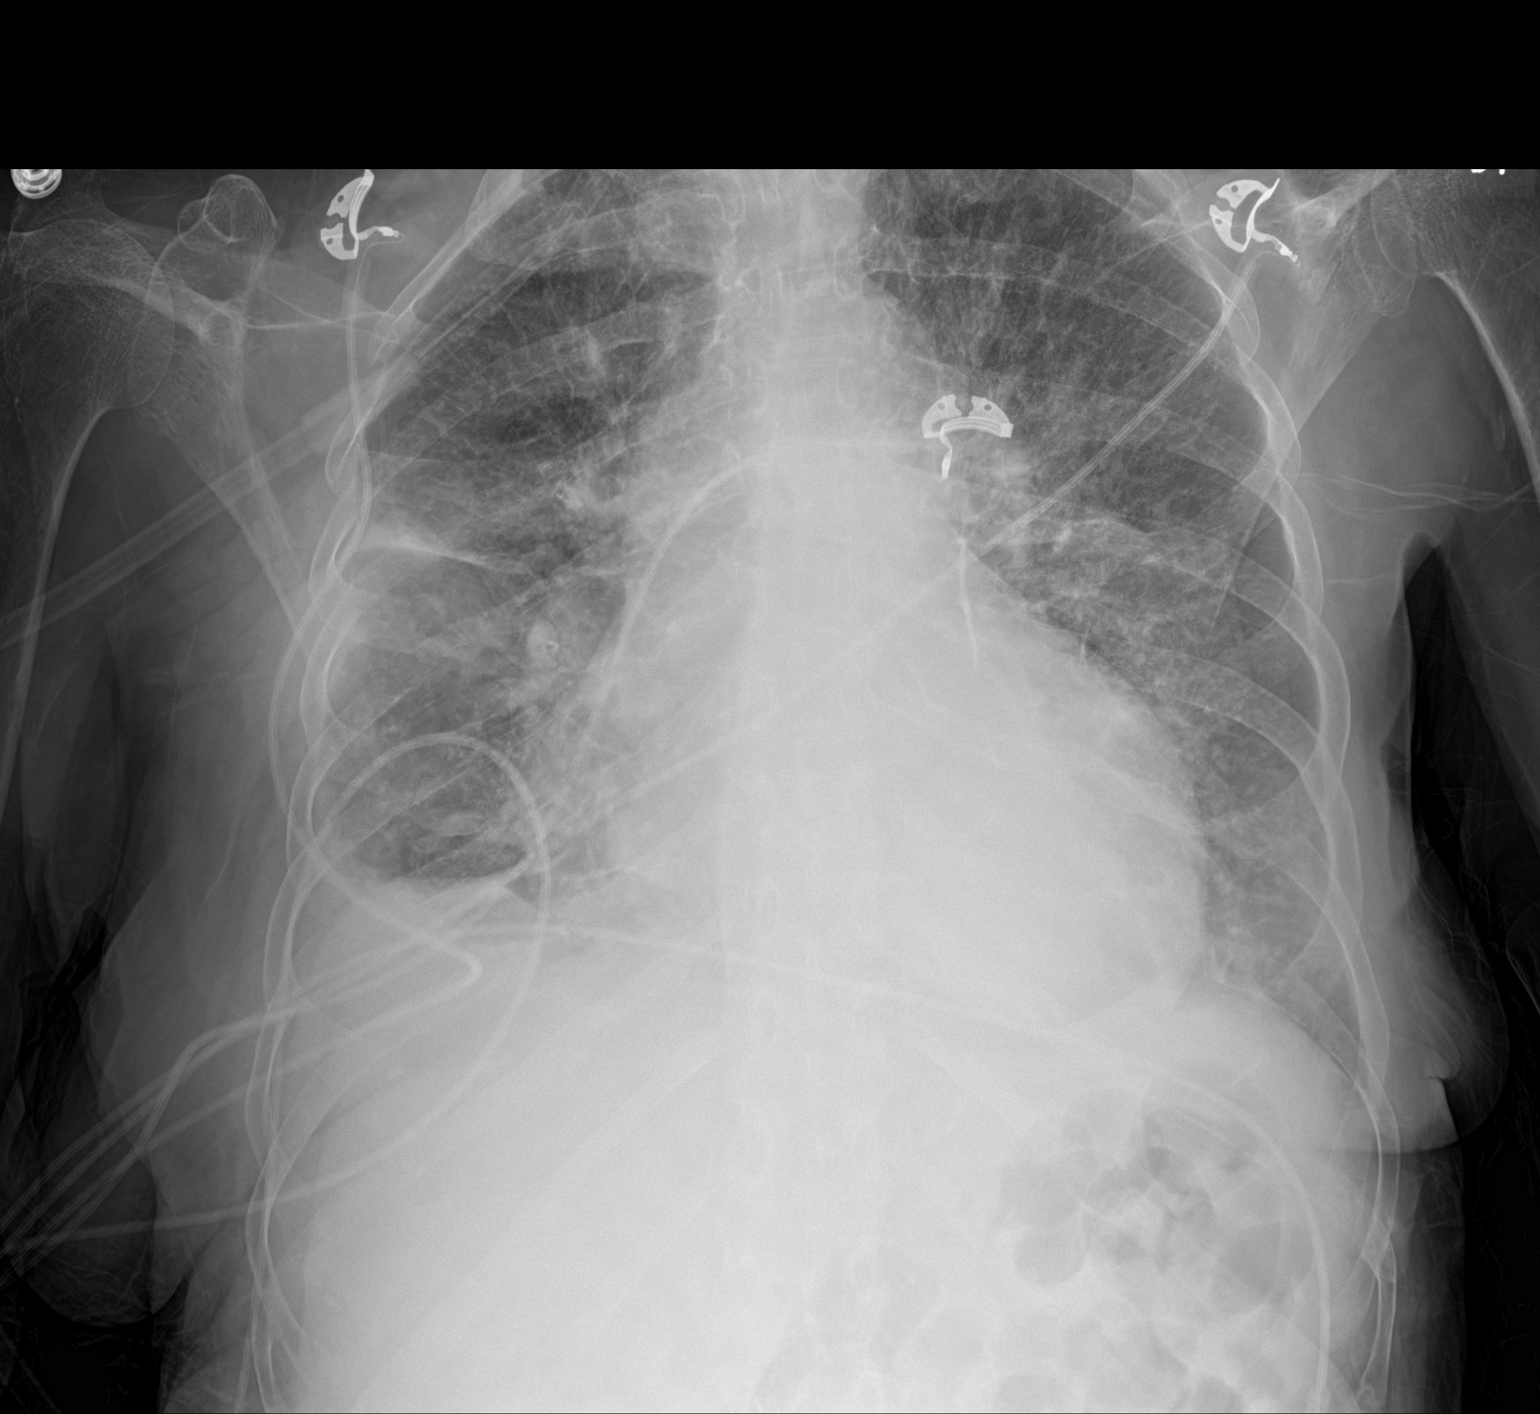

[1 of 1 positions shown; findings below may reference images not displayed]

FINDINGS: Mild cardiomegaly and pulmonary vascular congestion. Small right
pleural effusion. Bilateral lung opacities likely due to pulmonary
edema.
IMPRESSION: Radiographic findings most consistent with CHF/fluid volume
overload.

## 2020-12-13 MED ORDER — FUROSEMIDE 10 MG/ML IJ SOLN: 40.0000 mg | Freq: Once | INTRAMUSCULAR | Status: DC

## 2020-12-13 MED ORDER — FUROSEMIDE 10 MG/ML IJ SOLN
40.0000 mg | Freq: Once | INTRAMUSCULAR | Status: AC
  Administered 2020-12-13: 40 mg via INTRAVENOUS
  Filled 2020-12-13: qty 4

## 2020-12-13 NOTE — Plan of Care (Signed)
?  Problem: Clinical Measurements: ?Goal: Will remain free from infection ?Outcome: Progressing ?  ?

## 2020-12-13 NOTE — Progress Notes (Signed)
[facility] Progress Note   Subjective:   Dyspnea overnight with wheezing - duoneb after pRBC transfusion.  Rec'd lasix 40 IV with UOP 656m yesterday.  ~net even for the admission.  This AM denies complaints. Unfortunately her renal function continues to worsen Cr 4 today. CXR with continue pulmonary edema.   Objective Vitals:   12/12/20 2313 12/12/20 2314 12/13/20 0200 12/13/20 0512  BP: (!) 88/74 (!) 153/75  (!) 103/59  Pulse: 78 79  85  Resp: 18   18  Temp: 98.2 F (36.8 C)   97.7 F (36.5 C)  TempSrc: Oral   Oral  SpO2: 99%   100%  Weight:   51.2 kg   Height:       Physical Exam General: chronically ill lying at 15 deg in bed Heart: RRR, no rub Lungs: rales in bases still, diffuse wheezing; 100% on 2L [place] lying nearly flat.  Abdomen: soft, nontender Extremities: no edema Neuro: oriented to self and hospital; guesses at year, slow to respond and doesn't know details  Additional Objective Labs: Basic Metabolic Panel: Recent Labs  Lab 12/11/20 0531 12/12/20 0321 12/13/20 0400  NA 139 138 140  K 3.6 4.7 4.4  CL 109 107 108  CO2 18* 22 22  GLUCOSE 250* 121* 94  BUN 61* 84* 88*  CREATININE 3.07* 3.59* 3.99*  CALCIUM 8.6* 8.9 8.6*   Liver Function Tests: Recent Labs  Lab 12/11/20 0531 12/12/20 0321 12/13/20 0400  AST '23 18 16  '$ ALT '12 11 11  '$ ALKPHOS 74 61 66  BILITOT 0.6 0.5 1.0  PROT 6.2* 5.8* 5.7*  ALBUMIN 3.2* 3.0* 3.0*   No results for input(s): LIPASE, AMYLASE in the last 168 hours. CBC: Recent Labs  Lab 12/11/20 0531 12/12/20 0321 12/12/20 1053 12/12/20 1647 12/13/20 0400  WBC 11.0* 24.8* 20.4* 18.4* 13.1*  NEUTROABS 10.4* 21.4*  --   --  9.2*  HGB 9.0* 7.5* 7.1* 9.0* 10.4*  HCT 28.5* 23.4* 22.7* 27.7* 31.1*  MCV 96.9 96.3 97.8 96.5 92.6  PLT 245 244 231 241 215   Blood Culture    Component Value Date/Time   SDES URINE, RANDOM 12/11/2020 2302   SPECREQUEST  12/11/2020 2302    NONE [facility],  [address], [place] [address]   CULT MULTIPLE SPECIES PRESENT, SUGGEST RECOLLECTION (A) 12/11/2020 2302   REPTSTATUS 12/13/2020 FINAL 12/11/2020 2302    Cardiac Enzymes: No results for input(s): CKTOTAL, CKMB, CKMBINDEX, TROPONINI in the last 168 hours. CBG: Recent Labs  Lab 12/12/20 0628  GLUCAP 207*   Iron Studies: No results for input(s): IRON, TIBC, TRANSFERRIN, FERRITIN in the last 72 hours. '@lablastinr3'$ @ Studies/Results: DG Chest Port 1 View  Result Date: 12/13/2020 CLINICAL DATA:  Shortness of breath Wheezing EXAM: PORTABLE CHEST 1 VIEW COMPARISON:  12/12/2020 FINDINGS: Mild cardiomegaly and pulmonary vascular congestion. Small right pleural effusion. Bilateral lung opacities likely due to pulmonary edema. IMPRESSION: Radiographic findings most consistent with CHF/fluid volume overload. Electronically Signed   By: FMiachel RouxM.D.   On: 12/13/2020 08:00   DG Chest Port 1 View  Result Date: 12/12/2020 CLINICAL DATA:  Shortness of breath. EXAM: PORTABLE CHEST 1 VIEW COMPARISON:  Chest x-ray 02/10/2021. FINDINGS: Cardiomegaly. Diffuse bilateral pulmonary infiltrates consistent with pulmonary edema. Findings consistent CHF. Tiny bilateral pleural effusions may be present. No pneumothorax. Aortic atherosclerotic vascular calcification. IMPRESSION: Cardiomegaly with diffuse bilateral pulmonary infiltrates consistent with pulmonary edema. Findings consistent CHF. Tiny bilateral pleural effusions  may be present. Electronically Signed   By: Marcello Moores  Register   On: 12/12/2020 09:07   ECHOCARDIOGRAM COMPLETE  Result Date: 12/11/2020    ECHOCARDIOGRAM REPORT   Patient Name:   TAHLOR KOSIER Date of Exam: 12/11/2020 Medical Rec #:  [id number]     Height:       60.0 in Accession #:    [id number]    Weight:       108.0 lb Date of Birth:  06-26-1946     BSA:          1.437 m Patient Age:    75 years      BP:           117/84 mmHg Patient Gender: F             HR:           87 bpm. Exam  Location:  Inpatient Procedure: 2D Echo Indications:    congestive heart failure  History:        Patient has prior history of Echocardiogram examinations, most                 recent 05/15/2020.  Sonographer:    Luisa Hart RDCS Referring Phys: [place]  1. Left ventricular ejection fraction, by estimation, is 60 to 65%. The left ventricle has normal function. The left ventricle has no regional wall motion abnormalities. The left ventricular internal cavity size was mildly dilated. There is severe left ventricular hypertrophy. Left ventricular diastolic parameters are consistent with Grade II diastolic dysfunction (pseudonormalization). Elevated left ventricular end-diastolic pressure.  2. Right ventricular systolic function is normal. The right ventricular size is normal. There is moderately elevated pulmonary artery systolic pressure.  3. Left atrial size was mildly dilated.  4. A small pericardial effusion is present. The pericardial effusion is localized near the right atrium.  5. Degree of MR worse compared to echo done 05/15/20 ? restricted posterior leaflet motion . The mitral valve is abnormal. Moderate to severe mitral valve regurgitation. No evidence of mitral stenosis.  6. The aortic valve was not well visualized. Aortic valve regurgitation is moderate. No aortic stenosis is present.  7. The inferior vena cava is normal in size with greater than 50% respiratory variability, suggesting right atrial pressure of 3 mmHg. FINDINGS  Left Ventricle: Left ventricular ejection fraction, by estimation, is 60 to 65%. The left ventricle has normal function. The left ventricle has no regional wall motion abnormalities. The left ventricular internal cavity size was mildly dilated. There is  severe left ventricular hypertrophy. Left ventricular diastolic parameters are consistent with Grade II diastolic dysfunction (pseudonormalization). Elevated left ventricular end-diastolic pressure. Right  Ventricle: The right ventricular size is normal. No increase in right ventricular wall thickness. Right ventricular systolic function is normal. There is moderately elevated pulmonary artery systolic pressure. The tricuspid regurgitant velocity is 3.31 m/s, and with an assumed right atrial pressure of 15 mmHg, the estimated right ventricular systolic pressure is 0000000 mmHg. Left Atrium: Left atrial size was mildly dilated. Right Atrium: Right atrial size was normal in size. Pericardium: A small pericardial effusion is present. The pericardial effusion is localized near the right atrium. Mitral Valve: Degree of MR worse compared to echo done 05/15/20 ? restricted posterior leaflet motion. The mitral valve is abnormal. There is mild thickening of the mitral valve leaflet(s). There is mild calcification of the mitral valve leaflet(s). Moderate to severe mitral valve regurgitation. No evidence of mitral valve stenosis. Tricuspid  Valve: The tricuspid valve is normal in structure. Tricuspid valve regurgitation is mild . No evidence of tricuspid stenosis. Aortic Valve: The aortic valve was not well visualized. Aortic valve regurgitation is moderate. Aortic regurgitation PHT measures 231 msec. No aortic stenosis is present. Aortic valve mean gradient measures 5.0 mmHg. Aortic valve peak gradient measures 7.4 mmHg. Aortic valve area, by VTI measures 2.20 cm. Pulmonic Valve: The pulmonic valve was normal in structure. Pulmonic valve regurgitation is mild. No evidence of pulmonic stenosis. Aorta: The aortic root is normal in size and structure. Venous: The inferior vena cava is normal in size with greater than 50% respiratory variability, suggesting right atrial pressure of 3 mmHg. IAS/Shunts: No atrial level shunt detected by color flow Doppler.  LEFT VENTRICLE PLAX 2D LVIDd:         3.90 cm     Diastology LVIDs:         3.00 cm     LV e' medial:    3.05 cm/s LV PW:         1.60 cm     LV E/e' medial:  38.4 LV IVS:        1.80  cm     LV e' lateral:   2.39 cm/s LVOT diam:     2.00 cm     LV E/e' lateral: 49.0 LV SV:         59 LV SV Index:   41 LVOT Area:     3.14 cm  LV Volumes (MOD) LV vol d, MOD A2C: 73.9 ml LV vol d, MOD A4C: 74.5 ml LV vol s, MOD A2C: 37.2 ml LV vol s, MOD A4C: 42.7 ml LV SV MOD A2C:     36.7 ml LV SV MOD A4C:     74.5 ml LV SV MOD BP:      37.0 ml RIGHT VENTRICLE RV S prime:     12.20 cm/s  PULMONARY VEINS TAPSE (M-mode): 1.8 cm      A Reversal Duration: 103.00 msec                             A Reversal Velocity: 18.90 cm/s                             Diastolic Velocity:  0000000 cm/s                             S/D Velocity:        0.60                             Systolic Velocity:   [id number] cm/s LEFT ATRIUM             Index       RIGHT ATRIUM           Index LA diam:        4.10 cm 2.85 cm/m  RA Area:     10.30 cm LA Vol (A2C):   50.4 ml 35.08 ml/m RA Volume:   20.40 ml  14.20 ml/m LA Vol (A4C):   52.5 ml 36.54 ml/m LA Biplane Vol: 50.5 ml 35.15 ml/m  AORTIC VALVE                    PULMONIC VALVE AV  Area (Vmax):    2.28 cm     PV Vmax:       0.82 m/s AV Area (Vmean):   2.19 cm     PV Vmean:      75.400 cm/s AV Area (VTI):     2.20 cm     PV VTI:        0.213 m AV Vmax:           136.00 cm/s  PV Peak grad:  2.7 mmHg AV Vmean:          102.000 cm/s PV Mean grad:  3.0 mmHg AV VTI:            0.269 m AV Peak Grad:      7.4 mmHg AV Mean Grad:      5.0 mmHg LVOT Vmax:         98.90 cm/s LVOT Vmean:        71.100 cm/s LVOT VTI:          0.188 m LVOT/AV VTI ratio: 0.70 AI PHT:            231 msec  AORTA Ao Root diam: 3.30 cm Ao Asc diam:  2.60 cm MITRAL VALVE                 TRICUSPID VALVE MV Area (PHT): 5.54 cm      TR Peak grad:   43.8 mmHg MV Decel Time: 137 msec      TR Vmax:        331.00 cm/s MR Peak grad:    145.0 mmHg MR Mean grad:    98.0 mmHg   SHUNTS MR Vmax:         602.00 cm/s Systemic VTI:  0.19 m MR Vmean:        473.0 cm/s  Systemic Diam: 2.00 cm MR PISA:         2.26 cm MR PISA Eff ROA: 9 mm  MR PISA Radius:  0.60 cm MV E velocity: 117.00 cm/s MV A velocity: 168.00 cm/s MV E/A ratio:  0.70 Jenkins Rouge MD Electronically signed by Jenkins Rouge MD Signature Date/Time: 12/11/2020/5:26:29 PM    Final    VAS [place] LOWER EXTREMITY VENOUS (DVT)  Result Date: 12/11/2020  Lower Venous DVT Study Indications: Elevated d-dimer, recent covid infection.  Comparison Study: No previous exams Performing Technologist: Rogelia Rohrer  Examination Guidelines: A complete evaluation includes B-mode imaging, spectral Doppler, color Doppler, and power Doppler as needed of all accessible portions of each vessel. Bilateral testing is considered an integral part of a complete examination. Limited examinations for reoccurring indications may be performed as noted. The reflux portion of the exam is performed with the patient in reverse Trendelenburg.  +---------+---------------+---------+-----------+----------+--------------+ RIGHT    CompressibilityPhasicitySpontaneityPropertiesThrombus Aging +---------+---------------+---------+-----------+----------+--------------+ CFV      Full           Yes      Yes                                 +---------+---------------+---------+-----------+----------+--------------+ SFJ      Full                                                        +---------+---------------+---------+-----------+----------+--------------+ FV Prox  Full           Yes      Yes                                 +---------+---------------+---------+-----------+----------+--------------+ FV Mid   Full           Yes      Yes                                 +---------+---------------+---------+-----------+----------+--------------+ FV DistalFull           Yes      Yes                                 +---------+---------------+---------+-----------+----------+--------------+ PFV      Full                                                         +---------+---------------+---------+-----------+----------+--------------+ POP      Full           Yes      Yes                                 +---------+---------------+---------+-----------+----------+--------------+ PTV      Full                                                        +---------+---------------+---------+-----------+----------+--------------+ PERO     Full                                                        +---------+---------------+---------+-----------+----------+--------------+   +---------+---------------+---------+-----------+----------+--------------+ LEFT     CompressibilityPhasicitySpontaneityPropertiesThrombus Aging +---------+---------------+---------+-----------+----------+--------------+ CFV      Full           Yes      Yes                                 +---------+---------------+---------+-----------+----------+--------------+ SFJ      Full                                                        +---------+---------------+---------+-----------+----------+--------------+ FV Prox  Full           Yes      Yes                                 +---------+---------------+---------+-----------+----------+--------------+ FV Mid   Full  Yes      Yes                                 +---------+---------------+---------+-----------+----------+--------------+ FV DistalFull           Yes      Yes                                 +---------+---------------+---------+-----------+----------+--------------+ PFV      Full                                                        +---------+---------------+---------+-----------+----------+--------------+ POP      Full           Yes      Yes                                 +---------+---------------+---------+-----------+----------+--------------+ PTV      Full                                                         +---------+---------------+---------+-----------+----------+--------------+ PERO     Full                                                        +---------+---------------+---------+-----------+----------+--------------+     Summary: BILATERAL: - No evidence of deep vein thrombosis seen in the lower extremities, bilaterally. - No evidence of superficial venous thrombosis in the lower extremities, bilaterally. -No evidence of popliteal cyst, bilaterally.   *See table(s) above for measurements and observations. Electronically signed by Harold Barban MD on 12/11/2020 at 10:42:39 PM.    Final    Medications:  . atorvastatin  40 mg Oral Daily  . folic acid  1 mg Oral Daily  . isosorbide mononitrate  30 mg Oral Daily  . memantine  10 mg Oral BID  . metoprolol tartrate  25 mg Oral BID  . pantoprazole (PROTONIX) IV  40 mg Intravenous Q12H  . senna-docusate  1 tablet Oral QHS  . thiamine  100 mg Oral Daily  . cyanocobalamin  1,000 mcg Oral Daily    Assessment/Plan **Acute hypoxic respiratory failure: CXR with pulmonary edema with BP > 200.  Improved with diuresis.  CXR still with edema today and her pulmonary exam is consistent with fairly significant overload - will redose lasix 40 IV now.  VQ low risk for PE, LE duplex neg DVT.  TTE normal EF, grade 2 DD.    **AKI on CKD:  Quite labile renal function lately but near recent baseline in the mid 2s.  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 Renal US CKD recently and bladder scan ok here.   Cr is up from 2.8 to 3 to 3.6 to 4 today after presenting with SBP > 200 and then being diuresed.  It appears she's quite sensitive to hemodynamic shifts.  Per above she is still having symptomatic volume overload and I think lasix 40 IV is indicated. I had a nice conversation with son 3/30 about where things stand - manage medically but not advance to dialysis if develops renal failure; son in agreement.   Avoid nephrotoxin, avoid hypotension, avoid hypovolemia.    **HTN urgency: SBP > 200 on presentation.  Now improved with BPs low.  D/c antiHTN meds.   **Anemia:  Hb 9.3 down to 7.1 and improved to 10.4 this AM after transfusion. no obvious bleeding. Heparin gtt and ASA held.    **Dementia: on namenda  **Leukocytosis: afebrile and no localizing s/s infection; improving. Per primary.   Will follow, page with questions.  NO plans for dialysis per my discussion with son on 3/30.  Per notes primary has d/w son -- if continued clinical decline plan for comfort care which is very reasonable.   Jannifer Hick MD 12/13/2020, 11:35 AM  [facility] Pager: [phone]

## 2020-12-14 LAB — CBC WITH DIFFERENTIAL/PLATELET
Abs Immature Granulocytes: 0.05 10*3/uL (ref 0.00–0.07)
Basophils Absolute: 0.1 10*3/uL (ref 0.0–0.1)
Basophils Relative: 1 %
Eosinophils Absolute: 0.3 10*3/uL (ref 0.0–0.5)
Eosinophils Relative: 3 %
HCT: 33.1 % — ABNORMAL LOW (ref 36.0–46.0)
Hemoglobin: 10.8 g/dL — ABNORMAL LOW (ref 12.0–15.0)
Immature Granulocytes: 1 %
Lymphocytes Relative: 21 %
Lymphs Abs: 1.8 10*3/uL (ref 0.7–4.0)
MCH: 30.9 pg (ref 26.0–34.0)
MCHC: 32.6 g/dL (ref 30.0–36.0)
MCV: 94.6 fL (ref 80.0–100.0)
Monocytes Absolute: 0.8 10*3/uL (ref 0.1–1.0)
Monocytes Relative: 9 %
Neutro Abs: 5.7 10*3/uL (ref 1.7–7.7)
Neutrophils Relative %: 65 %
Platelets: 206 10*3/uL (ref 150–400)
RBC: 3.5 MIL/uL — ABNORMAL LOW (ref 3.87–5.11)
RDW: 14.6 % (ref 11.5–15.5)
WBC: 8.8 10*3/uL (ref 4.0–10.5)
nRBC: 0 % (ref 0.0–0.2)

## 2020-12-14 LAB — COMPREHENSIVE METABOLIC PANEL
ALT: 12 U/L (ref 0–44)
AST: 16 U/L (ref 15–41)
Albumin: 2.9 g/dL — ABNORMAL LOW (ref 3.5–5.0)
Alkaline Phosphatase: 66 U/L (ref 38–126)
Anion gap: 10 (ref 5–15)
BUN: 86 mg/dL — ABNORMAL HIGH (ref 8–23)
CO2: 22 mmol/L (ref 22–32)
Calcium: 8.5 mg/dL — ABNORMAL LOW (ref 8.9–10.3)
Chloride: 108 mmol/L (ref 98–111)
Creatinine, Ser: 3.97 mg/dL — ABNORMAL HIGH (ref 0.44–1.00)
GFR, Estimated: 11 mL/min — ABNORMAL LOW (ref >60)
Glucose, Bld: 115 mg/dL — ABNORMAL HIGH (ref 70–99)
Potassium: 4.1 mmol/L (ref 3.5–5.1)
Sodium: 140 mmol/L (ref 135–145)
Total Bilirubin: 0.8 mg/dL (ref 0.3–1.2)
Total Protein: 5.8 g/dL — ABNORMAL LOW (ref 6.5–8.1)

## 2020-12-14 LAB — MAGNESIUM: Magnesium: 2.4 mg/dL (ref 1.7–2.4)

## 2020-12-14 LAB — BRAIN NATRIURETIC PEPTIDE: B Natriuretic Peptide: 1647.1 pg/mL — ABNORMAL HIGH (ref 0.0–100.0)

## 2020-12-14 LAB — PROCALCITONIN: Procalcitonin: 1.04 ng/mL

## 2020-12-14 MED ORDER — ASPIRIN 81 MG PO CHEW
81.0000 mg | CHEWABLE_TABLET | Freq: Every day | ORAL | Status: DC
  Administered 2020-12-14 – 2020-12-16 (×3): 81 mg via ORAL
  Filled 2020-12-14 (×3): qty 1

## 2020-12-14 NOTE — Progress Notes (Signed)
[facility] Progress Note   Subjective:   No overnight issues.  UOP 1.6L yesterday. Denies nausea, dyspnea.  Lying flat in bed, no family present.   Objective Vitals:   12/13/20 1156 12/13/20 1944 12/14/20 0010 12/14/20 0349  BP: 115/79 119/88 115/78 131/81  Pulse: 76 81 83 80  Resp: '18 18 19 16  '$ Temp: 98.2 F (36.8 C) (!) 97.4 F (36.3 C) 98 F (36.7 C) 97.7 F (36.5 C)  TempSrc: Oral Oral Oral Oral  SpO2: 97% 96% 98% 99%  Weight:    49.3 kg  Height:       Physical Exam General: chronically ill lying at 15 deg in bed Heart: RRR, no rub Lungs: rales in bases still, diffuse wheezing; 100% on 2L [place] lying nearly flat.  Abdomen: soft, nontender Extremities: no edema Neuro: oriented to self and hospital; guesses at year, slow to respond and doesn't know details  Additional Objective Labs: Basic Metabolic Panel: Recent Labs  Lab 12/12/20 0321 12/13/20 0400 12/14/20 0439  NA 138 140 140  K 4.7 4.4 4.1  CL 107 108 108  CO2 '22 22 22  '$ GLUCOSE 121* 94 115*  BUN 84* 88* 86*  CREATININE 3.59* 3.99* 3.97*  CALCIUM 8.9 8.6* 8.5*   Liver Function Tests: Recent Labs  Lab 12/12/20 0321 12/13/20 0400 12/14/20 0439  AST '18 16 16  '$ ALT '11 11 12  '$ ALKPHOS 61 66 66  BILITOT 0.5 1.0 0.8  PROT 5.8* 5.7* 5.8*  ALBUMIN 3.0* 3.0* 2.9*   No results for input(s): LIPASE, AMYLASE in the last 168 hours. CBC: Recent Labs  Lab 12/12/20 0321 12/12/20 1053 12/12/20 1647 12/13/20 0400 12/14/20 0439  WBC 24.8* 20.4* 18.4* 13.1* 8.8  NEUTROABS 21.4*  --   --  9.2* 5.7  HGB 7.5* 7.1* 9.0* 10.4* 10.8*  HCT 23.4* 22.7* 27.7* 31.1* 33.1*  MCV 96.3 97.8 96.5 92.6 94.6  PLT 244 231 241 215 206   Blood Culture    Component Value Date/Time   SDES URINE, RANDOM 12/11/2020 2302   SPECREQUEST  12/11/2020 2302    NONE Performed at [facility], [place] [address], [place], [place] [address]    CULT MULTIPLE SPECIES PRESENT, SUGGEST RECOLLECTION (A) 12/11/2020 2302    REPTSTATUS 12/13/2020 FINAL 12/11/2020 2302    Cardiac Enzymes: No results for input(s): CKTOTAL, CKMB, CKMBINDEX, TROPONINI in the last 168 hours. CBG: Recent Labs  Lab 12/12/20 0628  GLUCAP 207*   Iron Studies: No results for input(s): IRON, TIBC, TRANSFERRIN, FERRITIN in the last 72 hours. '@lablastinr3'$ @ Studies/Results: DG Chest Port 1 View  Result Date: 12/13/2020 CLINICAL DATA:  Shortness of breath Wheezing EXAM: PORTABLE CHEST 1 VIEW COMPARISON:  12/12/2020 FINDINGS: Mild cardiomegaly and pulmonary vascular congestion. Small right pleural effusion. Bilateral lung opacities likely due to pulmonary edema. IMPRESSION: Radiographic findings most consistent with CHF/fluid volume overload. Electronically Signed   By: Miachel Roux M.D.   On: 12/13/2020 08:00   Medications:  . atorvastatin  40 mg Oral Daily  . folic acid  1 mg Oral Daily  . memantine  10 mg Oral BID  . pantoprazole (PROTONIX) IV  40 mg Intravenous Q12H  . senna-docusate  1 tablet Oral QHS  . thiamine  100 mg Oral Daily  . cyanocobalamin  1,000 mcg Oral Daily    Assessment/Plan **Acute hypoxic respiratory failure: CXR with pulmonary edema with BP > 200.  Improved with diuresis and BP control.  VQ low risk for PE, LE duplex  neg DVT.  TTE normal EF, grade 2 DD.    **AKI on CKD:  Quite labile renal function lately but near recent baseline in the mid 2s.  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 Renal US CKD recently and bladder scan ok here.   Cr is up from 2.8 at presentation to 4 today which is stable c/w yesterday in the setting of HTN urgency and hemodynamic shifts.  I had a nice conversation with son 3/30 about where things stand - manage medically but not advance to dialysis if develops renal failure; son in agreement.  Dosing diuretics PRN at this time.  Avoid nephrotoxin, avoid hypotension, avoid hypovolemia.   **HTN urgency: SBP > 200 on presentation.  Normotensive off meds now.  **Anemia:  Hb 9.3 down to 7.1  and improved to 10.4 this AM after transfusion. no obvious bleeding. Heparin gtt and ASA held.    **Dementia: on namenda  **Leukocytosis: afebrile and no localizing s/s infection; resolved.   Will follow, page with questions.  NO plans for dialysis per my discussion with son on 3/30.  Per notes primary has d/w son -- if continued clinical decline plan for comfort care which is very reasonable.   Jannifer Hick MD 12/14/2020, 12:14 PM  [facility] Pager: [phone]

## 2020-12-14 NOTE — Progress Notes (Signed)
PROGRESS NOTE                                                                                                                                                                                                             Patient Demographics:    Melinda Olson, is a 75 y.o. female, DOB - Jun 21, 1946, [id number]  Outpatient Primary MD for the patient is Sabra Heck, [place], [place]    LOS - 3  Admit date - 12/11/2020    Chief Complaint  Patient presents with  . Shortness of Breath       Brief Narrative (HPI from H&P) - Kairy Bolam is a 75 y.o. female with history of CVA with left-sided hemiparesis, dementia, chronic kidney disease stage IV last creatinine was around 1.6 in January 2022 when patient was admitted for Covid at that time was found to be acutely short of breath around midnight, she had seen her nephrologist 2 weeks ago for worsening renal function her at that time her blood pressure was on the lower side and her home Norvasc was stopped.  She had no chest pain in the ER but had significant shortness of breath and orthopnea she was admitted for CHF treatment.   Subjective:   Patient in bed, appears comfortable, denies any headache, no fever, no chest pain or pressure, no shortness of breath , no abdominal pain. No new focal weakness.    Assessment  & Plan :     1. Acute Hypoxic Resp. 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  Discussed with son due to her underlying dementia no heroics desired, gentle medical treatment if declines focus on comfort.    2.  Mild non-ACS pattern troponin elevation.  Likely due to in the setting of AKI on CKD 4.  Chest pain-free nonacute EKG, on   statin and beta-blocker, aspirin held due to dropping H&H.  3.  AKI on CKD 4.  Baseline creatinine between 1.7-2 - noted recent renal ultrasound showing medical disease, nephrology has been consulted for fluid management, renal function worsening, discussed with family, not an HD candidate, treat medically.  4.  Hypertensive emergency.  Much improved could have been high due to shortness of breath, low-dose beta-blocker and monitor.  5.  Dyslipidemia.  On statin.  6.  History of dementia.  On memantine.  Monitor.  Has developed some toxic encephalopathy, supportive care, will remain at risk for delirium.  Minimize narcotics and benzodiazepines.  7.  Elevated D-dimer.  Due to inflammation VQ scan and lower extremity venous duplex negative, no SCDs, stop heparin drip as H&H is dropping.  8. History of CVA with left-sided hemiplegia - on statins, resume aspirin with caution.  9.  Acute on chronic drop in H&H.  No evidence of active ongoing bleed however discontinue aspirin and heparin drip, IV PPI, H&H now stable after 2 units of packed RBC transfusion on 12/12/2020, continue to monitor cautiously.  Will reintroduce low-dose aspirin and monitor.       Condition - Extremely Guarded  Family Communication  :  Almyra Free [phone] message left 12/11/20 at 8.36 am, 12/12/20  Code Status : DNR  Consults  :  Renal  PUD Prophylaxis : PPI   Procedures  :     VQ - No PE  TTE -  1. Left ventricular ejection fraction, by estimation, is 60 to 65%. The left ventricle has normal function. The left ventricle has no regional wall motion abnormalities. The left ventricular internal cavity size was mildly dilated. There is severe left ventricular hypertrophy. Left ventricular diastolic parameters are consistent with Grade II diastolic dysfunction (pseudonormalization). Elevated left ventricular end-diastolic pressure.  2. Right ventricular systolic function is normal. The right ventricular size is normal.  There is moderately elevated pulmonary artery systolic pressure.  3. Left atrial size was mildly dilated.  4. A small pericardial effusion is present. The pericardial effusion is localized near the right atrium.  5. Degree of MR worse compared to echo done 05/15/20 ? restricted posterior leaflet motion . The mitral valve is abnormal. Moderate to severe mitral valve regurgitation. No evidence of mitral stenosis.  6. The aortic valve was not well visualized. Aortic valve regurgitation is moderate. No aortic stenosis is present.  7. The inferior vena cava is normal in size with greater than 50% respiratory variability, suggesting right atrial pressure of 3 mmHg.  Leg [place] - No DVT      Disposition Plan  :    Status is: Inpatient  Remains inpatient appropriate because:IV treatments appropriate due to intensity of illness or inability to take PO   Dispo: The patient is from: Home              Anticipated d/c is to: Home              Patient currently is not medically stable to d/c.   Difficult to place patient No  DVT Prophylaxis  :   Heparin gtt  Lab Results  Component Value Date   PLT 206 12/14/2020    Diet :  Diet Order            Diet Heart Room service appropriate? Yes; Fluid consistency: Thin; Fluid restriction: 1200 mL Fluid  Diet effective now                  Inpatient Medications  Scheduled Meds: . atorvastatin  40 mg Oral Daily  . folic acid  1 mg Oral Daily  . memantine  10 mg Oral BID  . pantoprazole (PROTONIX) IV  40 mg Intravenous Q12H  . senna-docusate  1 tablet Oral QHS  .  thiamine  100 mg Oral Daily  . cyanocobalamin  1,000 mcg Oral Daily   Continuous Infusions:  PRN Meds:.acetaminophen **OR** acetaminophen, hydrALAZINE, ipratropium-albuterol  Antibiotics  :    Anti-infectives (From admission, onward)   None       Time Spent in minutes  30   Lala Lund M.D on 12/14/2020 at 1:31 PM  To page go to [email or web address]   [facility] -  Office   [phone]    See all Orders from today for further details    Objective:   Vitals:   12/13/20 1944 12/14/20 0010 12/14/20 0349 12/14/20 1314  BP: 119/88 115/78 131/81 132/80  Pulse: 81 83 80 82  Resp: '18 19 16 18  '$ Temp: (!) 97.4 F (36.3 C) 98 F (36.7 C) 97.7 F (36.5 C) 98.3 F (36.8 C)  TempSrc: Oral Oral Oral Oral  SpO2: 96% 98% 99% 99%  Weight:   49.3 kg   Height:        Wt Readings from Last 3 Encounters:  12/14/20 49.3 kg  09/23/20 48.8 kg  05/14/20 79.4 kg     Intake/Output Summary (Last 24 hours) at 12/14/2020 1331 Last data filed at 12/14/2020 0200 Gross per 24 hour  Intake --  Output 900 ml  Net -900 ml     Physical Exam  Awake but confused,  chronic left-sided weakness, anxious affect [place].AT,PERRAL Supple Neck,No JVD, No cervical lymphadenopathy appriciated.  Symmetrical Chest wall movement, Good air movement bilaterally, few rales RRR,No Gallops, Rubs or new Murmurs, No Parasternal Heave +ve B.Sounds, Abd Soft, No tenderness, No organomegaly appriciated, No rebound - guarding or rigidity. No Cyanosis, Clubbing or edema, No new Rash or bruise    Data Review:    CBC Recent Labs  Lab 12/11/20 0115 12/11/20 0531 12/12/20 0321 12/12/20 1053 12/12/20 1647 12/13/20 0400 12/14/20 0439  WBC 16.9* 11.0* 24.8* 20.4* 18.4* 13.1* 8.8  HGB 9.3* 9.0* 7.5* 7.1* 9.0* 10.4* 10.8*  HCT 30.0* 28.5* 23.4* 22.7* 27.7* 31.1* 33.1*  PLT 267 245 244 231 241 215 206  MCV 98.4 96.9 96.3 97.8 96.5 92.6 94.6  MCH 30.5 30.6 30.9 30.6 31.4 31.0 30.9  MCHC 31.0 31.6 32.1 31.3 32.5 33.4 32.6  RDW 13.6 13.6 14.0 14.1 14.1 14.8 14.6  LYMPHSABS 5.8* 0.4* 1.9  --   --  2.7 1.8  MONOABS 0.8 0.1 1.2*  --   --  0.9 0.8  EOSABS 0.6* 0.0 0.0  --   --  0.2 0.3  BASOSABS 0.1 0.0 0.0  --   --  0.1 0.1    Recent Labs  Lab 12/11/20 0115 12/11/20 0531 12/11/20 0904 12/12/20 0321 12/13/20 0400 12/14/20 0439  NA 140 139  --  138 140 140  K 3.6 3.6  --  4.7 4.4 4.1   CL 108 109  --  107 108 108  CO2 20* 18*  --  '22 22 22  '$ GLUCOSE 185* 250*  --  121* 94 115*  BUN 54* 61*  --  84* 88* 86*  CREATININE 2.78* 3.07*  --  3.59* 3.99* 3.97*  CALCIUM 8.4* 8.6*  --  8.9 8.6* 8.5*  AST 22 23  --  '18 16 16  '$ ALT 12 12  --  '11 11 12  '$ ALKPHOS 70 74  --  61 66 66  BILITOT 0.5 0.6  --  0.5 1.0 0.8  ALBUMIN 3.2* 3.2*  --  3.0* 3.0* 2.9*  MG  --  2.5*  --  2.4 2.4 2.4  DDIMER 4.65*  --   --   --   --   --   PROCALCITON  --   --  0.79 2.00 1.92 1.04  TSH  --  5.905*  --   --   --   --   BNP 1,884.3*  --   --  3,368.1* 2,025.2* 1,647.1*    ------------------------------------------------------------------------------------------------------------------ No results for input(s): CHOL, HDL, LDLCALC, TRIG, CHOLHDL, LDLDIRECT in the last 72 hours.  Lab Results  Component Value Date   HGBA1C 6.6 (H) 05/15/2020   ------------------------------------------------------------------------------------------------------------------   Micro Results Recent Results (from the past 240 hour(s))  SARS CORONAVIRUS 2 (TAT 6-24 HRS) Nasopharyngeal Nasopharyngeal Swab     Status: None   Collection Time: 12/11/20  4:07 AM   Specimen: Nasopharyngeal Swab  Result Value Ref Range Status   SARS Coronavirus 2 NEGATIVE NEGATIVE Final    Comment: (NOTE) SARS-CoV-2 target nucleic acids are NOT DETECTED.  The SARS-CoV-2 RNA is generally detectable in upper and lower respiratory specimens during the acute phase of infection. Negative results do not preclude SARS-CoV-2 infection, do not rule out co-infections with other pathogens, and should not be used as the sole basis for treatment or other patient management decisions. Negative results must be combined with clinical observations, patient history, and epidemiological information. The expected result is Negative.  Fact Sheet for Patients: SugarRoll.be  Fact Sheet for Healthcare  Providers: https://www.woods-mathews.com/  This test is not yet approved or cleared by the [place] FDA and  has been authorized for detection and/or diagnosis of SARS-CoV-2 by FDA under an Emergency Use Authorization (EUA). This EUA will remain  in effect (meaning this test can be used) for the duration of the COVID-19 declaration under Se ction 564(b)(1) of the Act, 21 U.S.C. section 360bbb-3(b)(1), unless the authorization is terminated or revoked sooner.  Performed at [facility], [place] [address], [place], [place] [address]   Culture, Urine     Status: Abnormal   Collection Time: 12/11/20 11:02 PM   Specimen: Urine, Random  Result Value Ref Range Status   Specimen Description URINE, RANDOM  Final   Special Requests   Final    NONE Performed at [facility], [place] [address], [place], [place] [address]    Culture MULTIPLE SPECIES PRESENT, SUGGEST RECOLLECTION (A)  Final   Report Status 12/13/2020 FINAL  Final    Radiology Reports US RENAL  Result Date: 11/30/2020 CLINICAL DATA:  Chronic renal disease EXAM: RENAL / URINARY TRACT ULTRASOUND COMPLETE COMPARISON:  None. FINDINGS: Right Kidney: Renal measurements: 8 x 2.8 x 3.4 cm = volume: 39.2 mL. Increased cortical echogenicity. Left Kidney: Renal measurements: 7.8 x 3.5 x 4.0 cm = volume: 58.1 mL. Increased cortical echogenicity. Bladder: Appears normal for degree of bladder distention. Other: None. IMPRESSION: Increased cortical echogenicity bilaterally. The kidneys are smaller than normal. The findings are consistent with medical renal disease. Electronically Signed   By: Dorise Bullion III M.D   On: 11/30/2020 21:15   NM Pulmonary Perf and Vent  Result Date: 12/11/2020 CLINICAL DATA:  Dyspnea with elevated D-dimer EXAM: NUCLEAR MEDICINE PERFUSION LUNG SCAN TECHNIQUE: Perfusion images were obtained in multiple projections after intravenous injection of radiopharmaceutical. Views: Anterior, posterior,  left lateral, right lateral, RPO, LPO, LAO, RAO RADIOPHARMACEUTICALS:  4.15 mCi Tc-56mMAA IV COMPARISON:  Chest radiograph December 11, 2020 FINDINGS: Radiotracer uptake bilaterally is homogeneous and symmetric. No perfusion defects evident. IMPRESSION: No perfusion defects evident on either  side. No findings indicative of pulmonary embolus. Electronically Signed   By: Lowella Grip III M.D.   On: 12/11/2020 11:01   DG Chest Port 1 View  Result Date: 12/13/2020 CLINICAL DATA:  Shortness of breath Wheezing EXAM: PORTABLE CHEST 1 VIEW COMPARISON:  12/12/2020 FINDINGS: Mild cardiomegaly and pulmonary vascular congestion. Small right pleural effusion. Bilateral lung opacities likely due to pulmonary edema. IMPRESSION: Radiographic findings most consistent with CHF/fluid volume overload. Electronically Signed   By: Miachel Roux M.D.   On: 12/13/2020 08:00   DG Chest Port 1 View  Result Date: 12/12/2020 CLINICAL DATA:  Shortness of breath. EXAM: PORTABLE CHEST 1 VIEW COMPARISON:  Chest x-ray 02/10/2021. FINDINGS: Cardiomegaly. Diffuse bilateral pulmonary infiltrates consistent with pulmonary edema. Findings consistent CHF. Tiny bilateral pleural effusions may be present. No pneumothorax. Aortic atherosclerotic vascular calcification. IMPRESSION: Cardiomegaly with diffuse bilateral pulmonary infiltrates consistent with pulmonary edema. Findings consistent CHF. Tiny bilateral pleural effusions may be present. Electronically Signed   By: Marcello Moores  Register   On: 12/12/2020 09:07   DG Chest Port 1 View  Result Date: 12/11/2020 CLINICAL DATA:  Dyspnea EXAM: PORTABLE CHEST 1 VIEW COMPARISON:  09/30/2020 FINDINGS: The lungs are symmetrically well expanded. Since the prior examination, there has developed trace diffuse interstitial pulmonary infiltrate, most in keeping with trace pulmonary edema, likely cardiogenic in nature. Cardiac size is mildly enlarged. No pneumothorax or pleural effusion. No acute bone  abnormality. IMPRESSION: Interval development of mild pulmonary edema, likely cardiogenic in nature. Electronically Signed   By: Fidela Salisbury MD   On: 12/11/2020 01:02   ECHOCARDIOGRAM COMPLETE  Result Date: 12/11/2020    ECHOCARDIOGRAM REPORT   Patient Name:   Melinda Olson Date of Exam: 12/11/2020 Medical Rec #:  [id number]     Height:       60.0 in Accession #:    [id number]    Weight:       108.0 lb Date of Birth:  December 12, 1945     BSA:          1.437 m Patient Age:    57 years      BP:           117/84 mmHg Patient Gender: F             HR:           87 bpm. Exam Location:  Inpatient Procedure: 2D Echo Indications:    congestive heart failure  History:        Patient has prior history of Echocardiogram examinations, most                 recent 05/15/2020.  Sonographer:    Luisa Hart RDCS Referring Phys: [place]  1. Left ventricular ejection fraction, by estimation, is 60 to 65%. The left ventricle has normal function. The left ventricle has no regional wall motion abnormalities. The left ventricular internal cavity size was mildly dilated. There is severe left ventricular hypertrophy. Left ventricular diastolic parameters are consistent with Grade II diastolic dysfunction (pseudonormalization). Elevated left ventricular end-diastolic pressure.  2. Right ventricular systolic function is normal. The right ventricular size is normal. There is moderately elevated pulmonary artery systolic pressure.  3. Left atrial size was mildly dilated.  4. A small pericardial effusion is present. The pericardial effusion is localized near the right atrium.  5. Degree of MR worse compared to echo done 05/15/20 ? restricted posterior leaflet motion . The mitral valve is abnormal. Moderate to severe  mitral valve regurgitation. No evidence of mitral stenosis.  6. The aortic valve was not well visualized. Aortic valve regurgitation is moderate. No aortic stenosis is present.  7. The inferior vena cava is  normal in size with greater than 50% respiratory variability, suggesting right atrial pressure of 3 mmHg. FINDINGS  Left Ventricle: Left ventricular ejection fraction, by estimation, is 60 to 65%. The left ventricle has normal function. The left ventricle has no regional wall motion abnormalities. The left ventricular internal cavity size was mildly dilated. There is  severe left ventricular hypertrophy. Left ventricular diastolic parameters are consistent with Grade II diastolic dysfunction (pseudonormalization). Elevated left ventricular end-diastolic pressure. Right Ventricle: The right ventricular size is normal. No increase in right ventricular wall thickness. Right ventricular systolic function is normal. There is moderately elevated pulmonary artery systolic pressure. The tricuspid regurgitant velocity is 3.31 m/s, and with an assumed right atrial pressure of 15 mmHg, the estimated right ventricular systolic pressure is 0000000 mmHg. Left Atrium: Left atrial size was mildly dilated. Right Atrium: Right atrial size was normal in size. Pericardium: A small pericardial effusion is present. The pericardial effusion is localized near the right atrium. Mitral Valve: Degree of MR worse compared to echo done 05/15/20 ? restricted posterior leaflet motion. The mitral valve is abnormal. There is mild thickening of the mitral valve leaflet(s). There is mild calcification of the mitral valve leaflet(s). Moderate to severe mitral valve regurgitation. No evidence of mitral valve stenosis. Tricuspid Valve: The tricuspid valve is normal in structure. Tricuspid valve regurgitation is mild . No evidence of tricuspid stenosis. Aortic Valve: The aortic valve was not well visualized. Aortic valve regurgitation is moderate. Aortic regurgitation PHT measures 231 msec. No aortic stenosis is present. Aortic valve mean gradient measures 5.0 mmHg. Aortic valve peak gradient measures 7.4 mmHg. Aortic valve area, by VTI measures 2.20 cm.  Pulmonic Valve: The pulmonic valve was normal in structure. Pulmonic valve regurgitation is mild. No evidence of pulmonic stenosis. Aorta: The aortic root is normal in size and structure. Venous: The inferior vena cava is normal in size with greater than 50% respiratory variability, suggesting right atrial pressure of 3 mmHg. IAS/Shunts: No atrial level shunt detected by color flow Doppler.  LEFT VENTRICLE PLAX 2D LVIDd:         3.90 cm     Diastology LVIDs:         3.00 cm     LV e' medial:    3.05 cm/s LV PW:         1.60 cm     LV E/e' medial:  38.4 LV IVS:        1.80 cm     LV e' lateral:   2.39 cm/s LVOT diam:     2.00 cm     LV E/e' lateral: 49.0 LV SV:         59 LV SV Index:   41 LVOT Area:     3.14 cm  LV Volumes (MOD) LV vol d, MOD A2C: 73.9 ml LV vol d, MOD A4C: 74.5 ml LV vol s, MOD A2C: 37.2 ml LV vol s, MOD A4C: 42.7 ml LV SV MOD A2C:     36.7 ml LV SV MOD A4C:     74.5 ml LV SV MOD BP:      37.0 ml RIGHT VENTRICLE RV S prime:     12.20 cm/s  PULMONARY VEINS TAPSE (M-mode): 1.8 cm      A Reversal Duration: 103.00 msec  A Reversal Velocity: 18.90 cm/s                             Diastolic Velocity:  0000000 cm/s                             S/D Velocity:        0.60                             Systolic Velocity:   [id number] cm/s LEFT ATRIUM             Index       RIGHT ATRIUM           Index LA diam:        4.10 cm 2.85 cm/m  RA Area:     10.30 cm LA Vol (A2C):   50.4 ml 35.08 ml/m RA Volume:   20.40 ml  14.20 ml/m LA Vol (A4C):   52.5 ml 36.54 ml/m LA Biplane Vol: 50.5 ml 35.15 ml/m  AORTIC VALVE                    PULMONIC VALVE AV Area (Vmax):    2.28 cm     PV Vmax:       0.82 m/s AV Area (Vmean):   2.19 cm     PV Vmean:      75.400 cm/s AV Area (VTI):     2.20 cm     PV VTI:        0.213 m AV Vmax:           136.00 cm/s  PV Peak grad:  2.7 mmHg AV Vmean:          102.000 cm/s PV Mean grad:  3.0 mmHg AV VTI:            0.269 m AV Peak Grad:      7.4 mmHg AV Mean Grad:       5.0 mmHg LVOT Vmax:         98.90 cm/s LVOT Vmean:        71.100 cm/s LVOT VTI:          0.188 m LVOT/AV VTI ratio: 0.70 AI PHT:            231 msec  AORTA Ao Root diam: 3.30 cm Ao Asc diam:  2.60 cm MITRAL VALVE                 TRICUSPID VALVE MV Area (PHT): 5.54 cm      TR Peak grad:   43.8 mmHg MV Decel Time: 137 msec      TR Vmax:        331.00 cm/s MR Peak grad:    145.0 mmHg MR Mean grad:    98.0 mmHg   SHUNTS MR Vmax:         602.00 cm/s Systemic VTI:  0.19 m MR Vmean:        473.0 cm/s  Systemic Diam: 2.00 cm MR PISA:         2.26 cm MR PISA Eff ROA: 9 mm MR PISA Radius:  0.60 cm MV E velocity: 117.00 cm/s MV A velocity: 168.00 cm/s MV E/A ratio:  0.70 Jenkins Rouge MD Electronically signed by Jenkins Rouge MD Signature Date/Time: 12/11/2020/5:26:29 PM    Final  VAS [place] LOWER EXTREMITY VENOUS (DVT)  Result Date: 12/11/2020  Lower Venous DVT Study Indications: Elevated d-dimer, recent covid infection.  Comparison Study: No previous exams Performing Technologist: Rogelia Rohrer  Examination Guidelines: A complete evaluation includes B-mode imaging, spectral Doppler, color Doppler, and power Doppler as needed of all accessible portions of each vessel. Bilateral testing is considered an integral part of a complete examination. Limited examinations for reoccurring indications may be performed as noted. The reflux portion of the exam is performed with the patient in reverse Trendelenburg.  +---------+---------------+---------+-----------+----------+--------------+ RIGHT    CompressibilityPhasicitySpontaneityPropertiesThrombus Aging +---------+---------------+---------+-----------+----------+--------------+ CFV      Full           Yes      Yes                                 +---------+---------------+---------+-----------+----------+--------------+ SFJ      Full                                                         +---------+---------------+---------+-----------+----------+--------------+ FV Prox  Full           Yes      Yes                                 +---------+---------------+---------+-----------+----------+--------------+ FV Mid   Full           Yes      Yes                                 +---------+---------------+---------+-----------+----------+--------------+ FV DistalFull           Yes      Yes                                 +---------+---------------+---------+-----------+----------+--------------+ PFV      Full                                                        +---------+---------------+---------+-----------+----------+--------------+ POP      Full           Yes      Yes                                 +---------+---------------+---------+-----------+----------+--------------+ PTV      Full                                                        +---------+---------------+---------+-----------+----------+--------------+ PERO     Full                                                        +---------+---------------+---------+-----------+----------+--------------+   +---------+---------------+---------+-----------+----------+--------------+  LEFT     CompressibilityPhasicitySpontaneityPropertiesThrombus Aging +---------+---------------+---------+-----------+----------+--------------+ CFV      Full           Yes      Yes                                 +---------+---------------+---------+-----------+----------+--------------+ SFJ      Full                                                        +---------+---------------+---------+-----------+----------+--------------+ FV Prox  Full           Yes      Yes                                 +---------+---------------+---------+-----------+----------+--------------+ FV Mid   Full           Yes      Yes                                  +---------+---------------+---------+-----------+----------+--------------+ FV DistalFull           Yes      Yes                                 +---------+---------------+---------+-----------+----------+--------------+ PFV      Full                                                        +---------+---------------+---------+-----------+----------+--------------+ POP      Full           Yes      Yes                                 +---------+---------------+---------+-----------+----------+--------------+ PTV      Full                                                        +---------+---------------+---------+-----------+----------+--------------+ PERO     Full                                                        +---------+---------------+---------+-----------+----------+--------------+     Summary: BILATERAL: - No evidence of deep vein thrombosis seen in the lower extremities, bilaterally. - No evidence of superficial venous thrombosis in the lower extremities, bilaterally. -No evidence of popliteal cyst, bilaterally.   *See table(s) above for measurements and observations. Electronically signed by Harold Barban MD on 12/11/2020 at 10:42:39 PM.    Final

## 2020-12-15 ENCOUNTER — Inpatient Hospital Stay (HOSPITAL_COMMUNITY): Payer: Medicare Other

## 2020-12-15 LAB — CBC WITH DIFFERENTIAL/PLATELET
Abs Immature Granulocytes: 0.05 10*3/uL (ref 0.00–0.07)
Basophils Absolute: 0.1 10*3/uL (ref 0.0–0.1)
Basophils Relative: 1 %
Eosinophils Absolute: 0.6 10*3/uL — ABNORMAL HIGH (ref 0.0–0.5)
Eosinophils Relative: 6 %
HCT: 33.3 % — ABNORMAL LOW (ref 36.0–46.0)
Hemoglobin: 11.1 g/dL — ABNORMAL LOW (ref 12.0–15.0)
Immature Granulocytes: 1 %
Lymphocytes Relative: 22 %
Lymphs Abs: 2.2 10*3/uL (ref 0.7–4.0)
MCH: 31.7 pg (ref 26.0–34.0)
MCHC: 33.3 g/dL (ref 30.0–36.0)
MCV: 95.1 fL (ref 80.0–100.0)
Monocytes Absolute: 0.9 10*3/uL (ref 0.1–1.0)
Monocytes Relative: 9 %
Neutro Abs: 6 10*3/uL (ref 1.7–7.7)
Neutrophils Relative %: 61 %
Platelets: 222 10*3/uL (ref 150–400)
RBC: 3.5 MIL/uL — ABNORMAL LOW (ref 3.87–5.11)
RDW: 14.3 % (ref 11.5–15.5)
WBC: 9.8 10*3/uL (ref 4.0–10.5)
nRBC: 0 % (ref 0.0–0.2)

## 2020-12-15 LAB — COMPREHENSIVE METABOLIC PANEL
ALT: 11 U/L (ref 0–44)
AST: 19 U/L (ref 15–41)
Albumin: 2.9 g/dL — ABNORMAL LOW (ref 3.5–5.0)
Alkaline Phosphatase: 66 U/L (ref 38–126)
Anion gap: 8 (ref 5–15)
BUN: 74 mg/dL — ABNORMAL HIGH (ref 8–23)
CO2: 22 mmol/L (ref 22–32)
Calcium: 8.6 mg/dL — ABNORMAL LOW (ref 8.9–10.3)
Chloride: 110 mmol/L (ref 98–111)
Creatinine, Ser: 3.37 mg/dL — ABNORMAL HIGH (ref 0.44–1.00)
GFR, Estimated: 14 mL/min — ABNORMAL LOW (ref >60)
Glucose, Bld: 104 mg/dL — ABNORMAL HIGH (ref 70–99)
Potassium: 4.1 mmol/L (ref 3.5–5.1)
Sodium: 140 mmol/L (ref 135–145)
Total Bilirubin: 0.9 mg/dL (ref 0.3–1.2)
Total Protein: 5.7 g/dL — ABNORMAL LOW (ref 6.5–8.1)

## 2020-12-15 LAB — MAGNESIUM: Magnesium: 2.2 mg/dL (ref 1.7–2.4)

## 2020-12-15 LAB — BRAIN NATRIURETIC PEPTIDE: B Natriuretic Peptide: 2094.6 pg/mL — ABNORMAL HIGH (ref 0.0–100.0)

## 2020-12-15 LAB — PROCALCITONIN: Procalcitonin: 0.49 ng/mL

## 2020-12-15 IMAGING — DX DG CHEST 1V PORT
1 series · 1 of 1 positions shown · non-contrast
Comparison: [DATE]

CLINICAL DATA: Shortness of breath/unresponsive.

EXAM:
PORTABLE CHEST 1 VIEW

[chest ap]
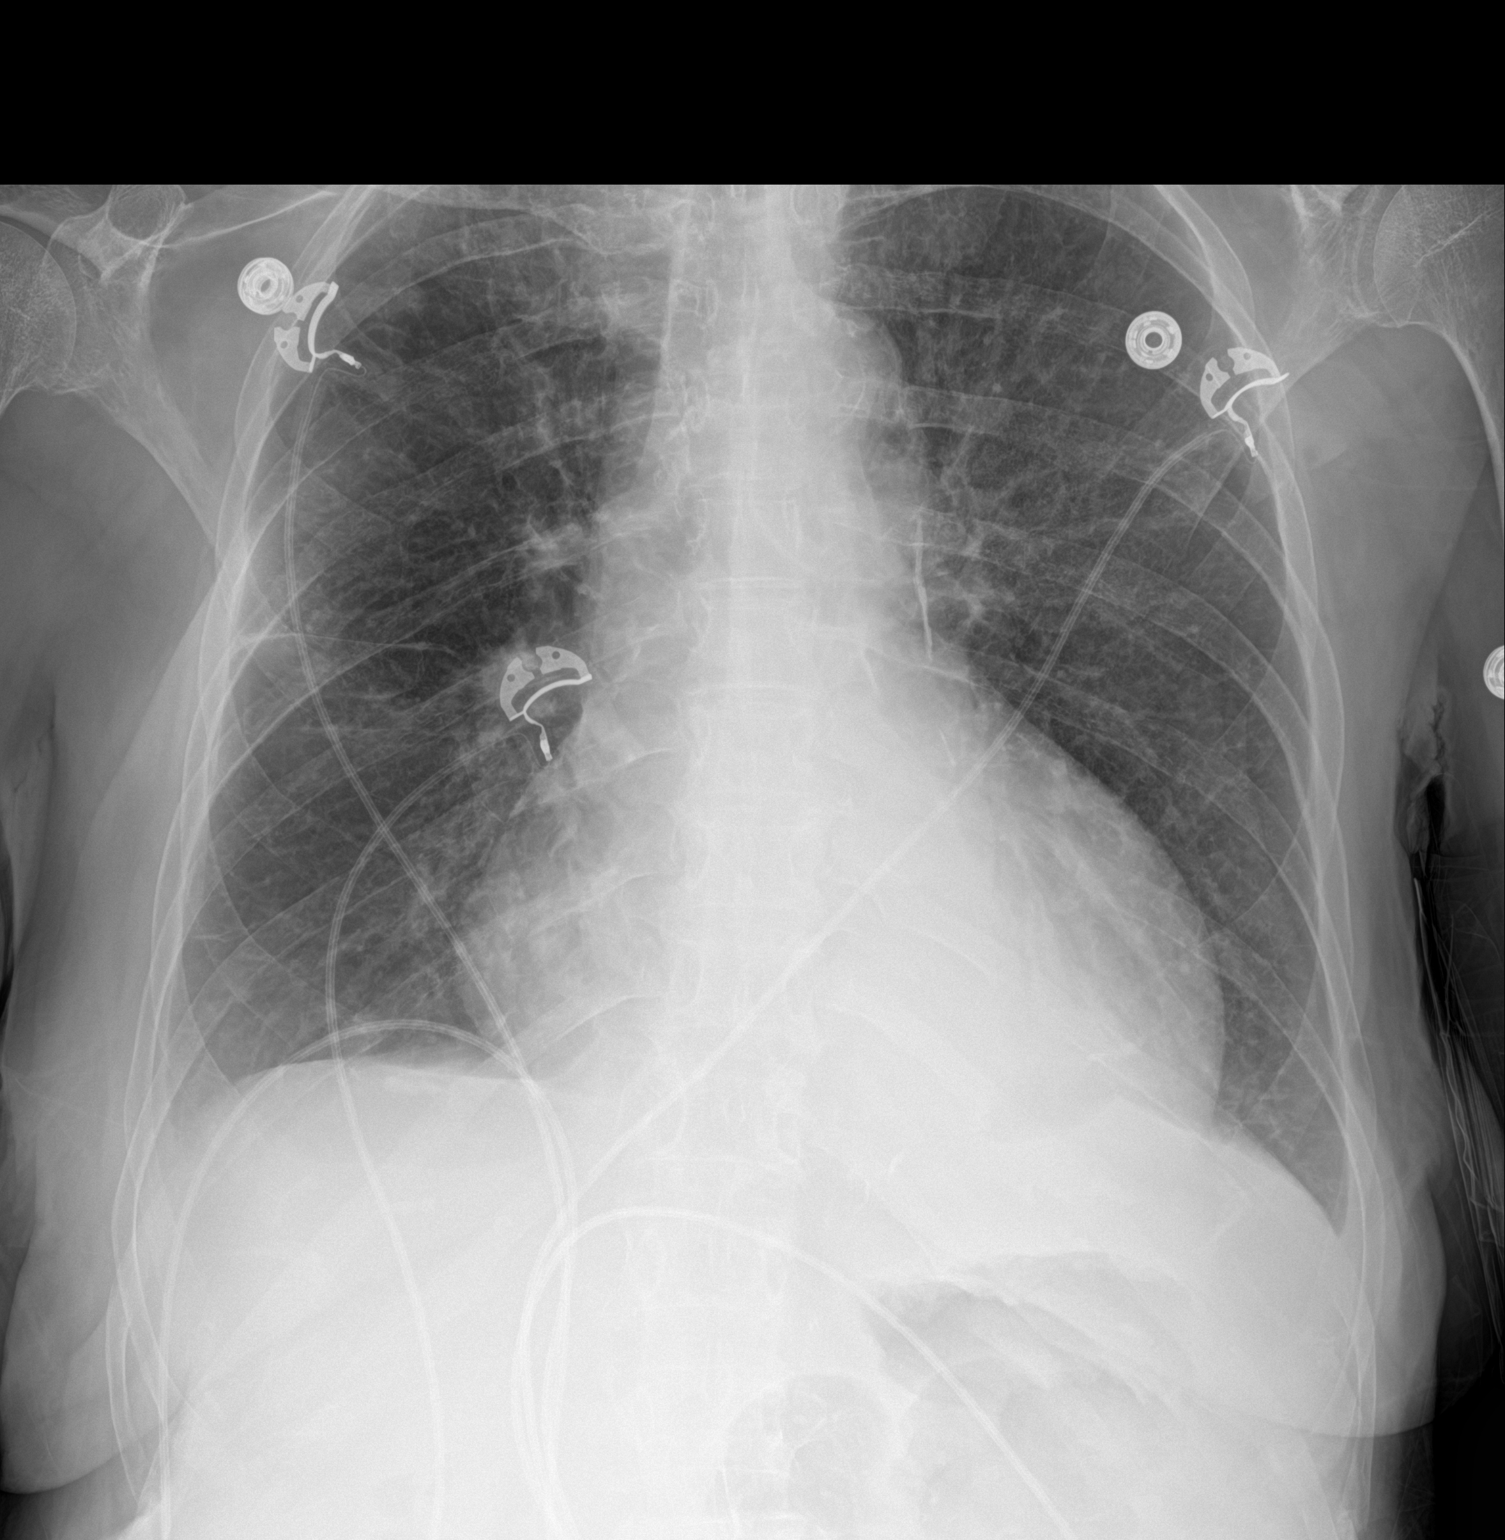

[1 of 1 positions shown; findings below may reference images not displayed]

FINDINGS: Lungs are adequately inflated without focal consolidation, effusion
or pneumothorax. Interval resolution of previously seen vascular
congestion. Mild stable cardiomegaly. Remainder the exam is
unchanged.
IMPRESSION: 1. No acute cardiopulmonary disease.
2. Mild stable cardiomegaly.

## 2020-12-15 MED ORDER — FUROSEMIDE 40 MG PO TABS
40.0000 mg | ORAL_TABLET | Freq: Once | ORAL | Status: AC
  Administered 2020-12-15: 40 mg via ORAL
  Filled 2020-12-15: qty 1

## 2020-12-15 NOTE — Progress Notes (Signed)
PROGRESS NOTE                                                                                                                                                                                                             Patient Demographics:    Melinda Olson, is a 75 y.o. female, DOB - Feb 06, 1946, [id number]  Outpatient Primary MD for the patient is Sabra Heck, [place], [place]    LOS - 4  Admit date - 12/11/2020    Chief Complaint  Patient presents with  . Shortness of Breath       Brief Narrative (HPI from H&P) - Melinda Olson is a 75 y.o. female with history of CVA with left-sided hemiparesis, dementia, chronic kidney disease stage IV last creatinine was around 1.6 in January 2022 when patient was admitted for Covid at that time was found to be acutely short of breath around midnight, she had seen her nephrologist 2 weeks ago for worsening renal function her at that time her blood pressure was on the lower side and her home Norvasc was stopped.  She had no chest pain in the ER but had significant shortness of breath and orthopnea she was admitted for CHF treatment.   Subjective:   Patient in bed, pleasantly confused, denies any chest pain or shortness of breath.   Assessment  & Plan :     1. Acute Hypoxic Resp. 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  Discussed with son due to her underlying dementia no heroics desired, gentle medical treatment if declines focus on comfort.  Overall clinically improved 12/15/20 discussed with son again, wants to take her home either with home health or home hospice.  Will request case management to talk to him, likely discharge on 12/16/2020.    2.   Mild non-ACS pattern troponin elevation.  Likely due to in the setting of AKI on CKD 4.  Chest pain-free nonacute EKG, on  statin and beta-blocker, aspirin held due to dropping H&H.  3.  AKI on CKD 4.  Baseline creatinine between 1.7-2 - noted recent renal ultrasound showing medical disease, nephrology  has been consulted for fluid management, renal function worsening, discussed with family, not an HD candidate, treat medically.  4.  Hypertensive emergency.  Much improved could have been high due to shortness of breath, low-dose beta-blocker and monitor.  5.  Dyslipidemia.  On statin.  6.  History of dementia.  On memantine.  Monitor.  Has developed some toxic encephalopathy, supportive care, will remain at risk for delirium.  Minimize narcotics and benzodiazepines.  7.  Elevated D-dimer.  Due to inflammation VQ scan and lower extremity venous duplex negative, no SCDs, stop heparin drip as H&H is dropping.  8. History of CVA with left-sided hemiplegia - on statins, resume aspirin with caution.  9.  Acute on chronic drop in H&H.  No evidence of active ongoing bleed however discontinue aspirin and heparin drip, IV PPI, H&H now stable after 2 units of packed RBC transfusion on 12/12/2020, continue to monitor cautiously.  Will reintroduce low-dose aspirin and monitor.       Condition - Extremely Guarded  Family Communication  :  Almyra Free [phone] message left 12/11/20 at 8.36 am, 12/12/20  Code Status : DNR  Consults  :  Renal  PUD Prophylaxis : PPI   Procedures  :     VQ - No PE  TTE -  1. Left ventricular ejection fraction, by estimation, is 60 to 65%. The left ventricle has normal function. The left ventricle has no regional wall motion abnormalities. The left ventricular internal cavity size was mildly dilated. There is severe left ventricular hypertrophy. Left ventricular diastolic parameters are consistent with Grade II diastolic dysfunction (pseudonormalization). Elevated left  ventricular end-diastolic pressure.  2. Right ventricular systolic function is normal. The right ventricular size is normal. There is moderately elevated pulmonary artery systolic pressure.  3. Left atrial size was mildly dilated.  4. A small pericardial effusion is present. The pericardial effusion is localized near the right atrium.  5. Degree of MR worse compared to echo done 05/15/20 ? restricted posterior leaflet motion . The mitral valve is abnormal. Moderate to severe mitral valve regurgitation. No evidence of mitral stenosis.  6. The aortic valve was not well visualized. Aortic valve regurgitation is moderate. No aortic stenosis is present.  7. The inferior vena cava is normal in size with greater than 50% respiratory variability, suggesting right atrial pressure of 3 mmHg.  Leg [place] - No DVT      Disposition Plan  :    Status is: Inpatient  Remains inpatient appropriate because:IV treatments appropriate due to intensity of illness or inability to take PO   Dispo: The patient is from: Home              Anticipated d/c is to: Home              Patient currently is not medically stable to d/c.   Difficult to place patient No  DVT Prophylaxis  :   Heparin gtt  Lab Results  Component Value Date   PLT 222 12/15/2020    Diet :  Diet Order            Diet Heart Room service appropriate? Yes; Fluid consistency: Thin; Fluid restriction: 1200 mL Fluid  Diet effective now                  Inpatient Medications  Scheduled Meds: . aspirin  81 mg Oral Daily  . atorvastatin  40 mg Oral Daily  . folic acid  1 mg Oral Daily  .  memantine  10 mg Oral BID  . pantoprazole (PROTONIX) IV  40 mg Intravenous Q12H  . senna-docusate  1 tablet Oral QHS  . thiamine  100 mg Oral Daily  . cyanocobalamin  1,000 mcg Oral Daily   Continuous Infusions:  PRN Meds:.acetaminophen **OR** [DISCONTINUED] acetaminophen, hydrALAZINE, ipratropium-albuterol  Antibiotics  :    Anti-infectives (From  admission, onward)   None       Time Spent in minutes  30   Lala Lund M.D on 12/15/2020 at 10:13 AM  To page go to [email or web address]   [facility] -  Office  [phone]    See all Orders from today for further details    Objective:   Vitals:   12/14/20 1314 12/14/20 2013 12/15/20 0425 12/15/20 0718  BP: 132/80 (!) 158/85 (!) 149/87 137/86  Pulse: 82 93 88 89  Resp: '18 20 16 20  '$ Temp: 98.3 F (36.8 C) 98.7 F (37.1 C) 98.5 F (36.9 C) 97.7 F (36.5 C)  TempSrc: Oral Oral Oral Oral  SpO2: 99% 94% 98% 96%  Weight:   48.4 kg   Height:        Wt Readings from Last 3 Encounters:  12/15/20 48.4 kg  09/23/20 48.8 kg  05/14/20 79.4 kg     Intake/Output Summary (Last 24 hours) at 12/15/2020 1013 Last data filed at 12/15/2020 0400 Gross per 24 hour  Intake 480 ml  Output 1600 ml  Net -1120 ml     Physical Exam  Awake but confused,  chronic left-sided weakness, anxious affect [place].AT,PERRAL Supple Neck,No JVD, No cervical lymphadenopathy appriciated.  Symmetrical Chest wall movement, Good air movement bilaterally, CTAB RRR,No Gallops, Rubs or new Murmurs, No Parasternal Heave +ve B.Sounds, Abd Soft, No tenderness, No organomegaly appriciated, No rebound - guarding or rigidity. No Cyanosis, Clubbing or edema, No new Rash or bruise     Data Review:    CBC Recent Labs  Lab 12/11/20 0531 12/12/20 0321 12/12/20 1053 12/12/20 1647 12/13/20 0400 12/14/20 0439 12/15/20 0337  WBC 11.0* 24.8* 20.4* 18.4* 13.1* 8.8 9.8  HGB 9.0* 7.5* 7.1* 9.0* 10.4* 10.8* 11.1*  HCT 28.5* 23.4* 22.7* 27.7* 31.1* 33.1* 33.3*  PLT 245 244 231 241 215 206 222  MCV 96.9 96.3 97.8 96.5 92.6 94.6 95.1  MCH 30.6 30.9 30.6 31.4 31.0 30.9 31.7  MCHC 31.6 32.1 31.3 32.5 33.4 32.6 33.3  RDW 13.6 14.0 14.1 14.1 14.8 14.6 14.3  LYMPHSABS 0.4* 1.9  --   --  2.7 1.8 2.2  MONOABS 0.1 1.2*  --   --  0.9 0.8 0.9  EOSABS 0.0 0.0  --   --  0.2 0.3 0.6*  BASOSABS 0.0 0.0  --   --  0.1  0.1 0.1    Recent Labs  Lab 12/11/20 0115 12/11/20 0531 12/11/20 0904 12/12/20 0321 12/13/20 0400 12/14/20 0439 12/15/20 0337  NA 140 139  --  138 140 140 140  K 3.6 3.6  --  4.7 4.4 4.1 4.1  CL 108 109  --  107 108 108 110  CO2 20* 18*  --  '22 22 22 22  '$ GLUCOSE 185* 250*  --  121* 94 115* 104*  BUN 54* 61*  --  84* 88* 86* 74*  CREATININE 2.78* 3.07*  --  3.59* 3.99* 3.97* 3.37*  CALCIUM 8.4* 8.6*  --  8.9 8.6* 8.5* 8.6*  AST 22 23  --  '18 16 16 19  '$ ALT 12 12  --  11 11  12 11  ALKPHOS 70 74  --  61 66 66 66  BILITOT 0.5 0.6  --  0.5 1.0 0.8 0.9  ALBUMIN 3.2* 3.2*  --  3.0* 3.0* 2.9* 2.9*  MG  --  2.5*  --  2.4 2.4 2.4 2.2  DDIMER 4.65*  --   --   --   --   --   --   PROCALCITON  --   --  0.79 2.00 1.92 1.04 0.49  TSH  --  5.905*  --   --   --   --   --   BNP 1,884.3*  --   --  3,368.1* 2,025.2* 1,647.1* 2,094.6*    ------------------------------------------------------------------------------------------------------------------ No results for input(s): CHOL, HDL, LDLCALC, TRIG, CHOLHDL, LDLDIRECT in the last 72 hours.  Lab Results  Component Value Date   HGBA1C 6.6 (H) 05/15/2020   ------------------------------------------------------------------------------------------------------------------   Micro Results Recent Results (from the past 240 hour(s))  SARS CORONAVIRUS 2 (TAT 6-24 HRS) Nasopharyngeal Nasopharyngeal Swab     Status: None   Collection Time: 12/11/20  4:07 AM   Specimen: Nasopharyngeal Swab  Result Value Ref Range Status   SARS Coronavirus 2 NEGATIVE NEGATIVE Final    Comment: (NOTE) SARS-CoV-2 target nucleic acids are NOT DETECTED.  The SARS-CoV-2 RNA is generally detectable in upper and lower respiratory specimens during the acute phase of infection. Negative results do not preclude SARS-CoV-2 infection, do not rule out co-infections with other pathogens, and should not be used as the sole basis for treatment or other patient management  decisions. Negative results must be combined with clinical observations, patient history, and epidemiological information. The expected result is Negative.  Fact Sheet for Patients: SugarRoll.be  Fact Sheet for Healthcare Providers: https://www.woods-mathews.com/  This test is not yet approved or cleared by the [place] FDA and  has been authorized for detection and/or diagnosis of SARS-CoV-2 by FDA under an Emergency Use Authorization (EUA). This EUA will remain  in effect (meaning this test can be used) for the duration of the COVID-19 declaration under Se ction 564(b)(1) of the Act, 21 U.S.C. section 360bbb-3(b)(1), unless the authorization is terminated or revoked sooner.  Performed at [facility], [place] [address] [place], [place] [address]   Culture, Urine     Status: Abnormal   Collection Time: 12/11/20 11:02 PM   Specimen: Urine, Random  Result Value Ref Range Status   Specimen Description URINE, RANDOM  Final   Special Requests   Final    NONE Performed at [facility], [place] [address] [place], [place] [address]    Culture MULTIPLE SPECIES PRESENT, SUGGEST RECOLLECTION (A)  Final   Report Status 12/13/2020 FINAL  Final    Radiology Reports US RENAL  Result Date: 11/30/2020 CLINICAL DATA:  Chronic renal disease EXAM: RENAL / URINARY TRACT ULTRASOUND COMPLETE COMPARISON:  None. FINDINGS: Right Kidney: Renal measurements: 8 x 2.8 x 3.4 cm = volume: 39.2 mL. Increased cortical echogenicity. Left Kidney: Renal measurements: 7.8 x 3.5 x 4.0 cm = volume: 58.1 mL. Increased cortical echogenicity. Bladder: Appears normal for degree of bladder distention. Other: None. IMPRESSION: Increased cortical echogenicity bilaterally. The kidneys are smaller than normal. The findings are consistent with medical renal disease. Electronically Signed   By: Dorise Bullion III M.D   On: 11/30/2020 21:15   NM Pulmonary Perf and  Vent  Result Date: 12/11/2020 CLINICAL DATA:  Dyspnea with elevated D-dimer EXAM: NUCLEAR MEDICINE PERFUSION LUNG SCAN TECHNIQUE: Perfusion images were  obtained in multiple projections after intravenous injection of radiopharmaceutical. Views: Anterior, posterior, left lateral, right lateral, RPO, LPO, LAO, RAO RADIOPHARMACEUTICALS:  4.15 mCi Tc-73mMAA IV COMPARISON:  Chest radiograph December 11, 2020 FINDINGS: Radiotracer uptake bilaterally is homogeneous and symmetric. No perfusion defects evident. IMPRESSION: No perfusion defects evident on either side. No findings indicative of pulmonary embolus. Electronically Signed   By: WLowella GripIII M.D.   On: 12/11/2020 11:01   DG Chest Port 1 View  Result Date: 12/15/2020 CLINICAL DATA:  Shortness of breath/unresponsive. EXAM: PORTABLE CHEST 1 VIEW COMPARISON:  12/13/2020 FINDINGS: Lungs are adequately inflated without focal consolidation, effusion or pneumothorax. Interval resolution of previously seen vascular congestion. Mild stable cardiomegaly. Remainder the exam is unchanged. IMPRESSION: 1. No acute cardiopulmonary disease. 2. Mild stable cardiomegaly. Electronically Signed   By: DMarin OlpM.D.   On: 12/15/2020 08:49   DG Chest Port 1 View  Result Date: 12/13/2020 CLINICAL DATA:  Shortness of breath Wheezing EXAM: PORTABLE CHEST 1 VIEW COMPARISON:  12/12/2020 FINDINGS: Mild cardiomegaly and pulmonary vascular congestion. Small right pleural effusion. Bilateral lung opacities likely due to pulmonary edema. IMPRESSION: Radiographic findings most consistent with CHF/fluid volume overload. Electronically Signed   By: FMiachel RouxM.D.   On: 12/13/2020 08:00   DG Chest Port 1 View  Result Date: 12/12/2020 CLINICAL DATA:  Shortness of breath. EXAM: PORTABLE CHEST 1 VIEW COMPARISON:  Chest x-ray 02/10/2021. FINDINGS: Cardiomegaly. Diffuse bilateral pulmonary infiltrates consistent with pulmonary edema. Findings consistent CHF. Tiny bilateral pleural  effusions may be present. No pneumothorax. Aortic atherosclerotic vascular calcification. IMPRESSION: Cardiomegaly with diffuse bilateral pulmonary infiltrates consistent with pulmonary edema. Findings consistent CHF. Tiny bilateral pleural effusions may be present. Electronically Signed   By: TMarcello Moores Register   On: 12/12/2020 09:07   DG Chest Port 1 View  Result Date: 12/11/2020 CLINICAL DATA:  Dyspnea EXAM: PORTABLE CHEST 1 VIEW COMPARISON:  09/30/2020 FINDINGS: The lungs are symmetrically well expanded. Since the prior examination, there has developed trace diffuse interstitial pulmonary infiltrate, most in keeping with trace pulmonary edema, likely cardiogenic in nature. Cardiac size is mildly enlarged. No pneumothorax or pleural effusion. No acute bone abnormality. IMPRESSION: Interval development of mild pulmonary edema, likely cardiogenic in nature. Electronically Signed   By: AFidela SalisburyMD   On: 12/11/2020 01:02   ECHOCARDIOGRAM COMPLETE  Result Date: 12/11/2020    ECHOCARDIOGRAM REPORT   Patient Name:   BDAYJAH CONTEEDate of Exam: 12/11/2020 Medical Rec #:  [id number]    Height:       60.0 in Accession #:    [id number]   Weight:       108.0 lb Date of Birth:  209/27/1947    BSA:          1.437 m Patient Age:    89years      BP:           117/84 mmHg Patient Gender: F             HR:           87 bpm. Exam Location:  Inpatient Procedure: 2D Echo Indications:    congestive heart failure  History:        Patient has prior history of Echocardiogram examinations, most                 recent 05/15/2020.  Sonographer:    TLuisa HartRDCS Referring Phys: 3[place] 1. Left  ventricular ejection fraction, by estimation, is 60 to 65%. The left ventricle has normal function. The left ventricle has no regional wall motion abnormalities. The left ventricular internal cavity size was mildly dilated. There is severe left ventricular hypertrophy. Left ventricular diastolic parameters  are consistent with Grade II diastolic dysfunction (pseudonormalization). Elevated left ventricular end-diastolic pressure.  2. Right ventricular systolic function is normal. The right ventricular size is normal. There is moderately elevated pulmonary artery systolic pressure.  3. Left atrial size was mildly dilated.  4. A small pericardial effusion is present. The pericardial effusion is localized near the right atrium.  5. Degree of MR worse compared to echo done 05/15/20 ? restricted posterior leaflet motion . The mitral valve is abnormal. Moderate to severe mitral valve regurgitation. No evidence of mitral stenosis.  6. The aortic valve was not well visualized. Aortic valve regurgitation is moderate. No aortic stenosis is present.  7. The inferior vena cava is normal in size with greater than 50% respiratory variability, suggesting right atrial pressure of 3 mmHg. FINDINGS  Left Ventricle: Left ventricular ejection fraction, by estimation, is 60 to 65%. The left ventricle has normal function. The left ventricle has no regional wall motion abnormalities. The left ventricular internal cavity size was mildly dilated. There is  severe left ventricular hypertrophy. Left ventricular diastolic parameters are consistent with Grade II diastolic dysfunction (pseudonormalization). Elevated left ventricular end-diastolic pressure. Right Ventricle: The right ventricular size is normal. No increase in right ventricular wall thickness. Right ventricular systolic function is normal. There is moderately elevated pulmonary artery systolic pressure. The tricuspid regurgitant velocity is 3.31 m/s, and with an assumed right atrial pressure of 15 mmHg, the estimated right ventricular systolic pressure is 0000000 mmHg. Left Atrium: Left atrial size was mildly dilated. Right Atrium: Right atrial size was normal in size. Pericardium: A small pericardial effusion is present. The pericardial effusion is localized near the right atrium. Mitral  Valve: Degree of MR worse compared to echo done 05/15/20 ? restricted posterior leaflet motion. The mitral valve is abnormal. There is mild thickening of the mitral valve leaflet(s). There is mild calcification of the mitral valve leaflet(s). Moderate to severe mitral valve regurgitation. No evidence of mitral valve stenosis. Tricuspid Valve: The tricuspid valve is normal in structure. Tricuspid valve regurgitation is mild . No evidence of tricuspid stenosis. Aortic Valve: The aortic valve was not well visualized. Aortic valve regurgitation is moderate. Aortic regurgitation PHT measures 231 msec. No aortic stenosis is present. Aortic valve mean gradient measures 5.0 mmHg. Aortic valve peak gradient measures 7.4 mmHg. Aortic valve area, by VTI measures 2.20 cm. Pulmonic Valve: The pulmonic valve was normal in structure. Pulmonic valve regurgitation is mild. No evidence of pulmonic stenosis. Aorta: The aortic root is normal in size and structure. Venous: The inferior vena cava is normal in size with greater than 50% respiratory variability, suggesting right atrial pressure of 3 mmHg. IAS/Shunts: No atrial level shunt detected by color flow Doppler.  LEFT VENTRICLE PLAX 2D LVIDd:         3.90 cm     Diastology LVIDs:         3.00 cm     LV e' medial:    3.05 cm/s LV PW:         1.60 cm     LV E/e' medial:  38.4 LV IVS:        1.80 cm     LV e' lateral:   2.39 cm/s LVOT diam:  2.00 cm     LV E/e' lateral: 49.0 LV SV:         59 LV SV Index:   41 LVOT Area:     3.14 cm  LV Volumes (MOD) LV vol d, MOD A2C: 73.9 ml LV vol d, MOD A4C: 74.5 ml LV vol s, MOD A2C: 37.2 ml LV vol s, MOD A4C: 42.7 ml LV SV MOD A2C:     36.7 ml LV SV MOD A4C:     74.5 ml LV SV MOD BP:      37.0 ml RIGHT VENTRICLE RV S prime:     12.20 cm/s  PULMONARY VEINS TAPSE (M-mode): 1.8 cm      A Reversal Duration: 103.00 msec                             A Reversal Velocity: 18.90 cm/s                             Diastolic Velocity:  0000000 cm/s                              S/D Velocity:        0.60                             Systolic Velocity:   [id number] cm/s LEFT ATRIUM             Index       RIGHT ATRIUM           Index LA diam:        4.10 cm 2.85 cm/m  RA Area:     10.30 cm LA Vol (A2C):   50.4 ml 35.08 ml/m RA Volume:   20.40 ml  14.20 ml/m LA Vol (A4C):   52.5 ml 36.54 ml/m LA Biplane Vol: 50.5 ml 35.15 ml/m  AORTIC VALVE                    PULMONIC VALVE AV Area (Vmax):    2.28 cm     PV Vmax:       0.82 m/s AV Area (Vmean):   2.19 cm     PV Vmean:      75.400 cm/s AV Area (VTI):     2.20 cm     PV VTI:        0.213 m AV Vmax:           136.00 cm/s  PV Peak grad:  2.7 mmHg AV Vmean:          102.000 cm/s PV Mean grad:  3.0 mmHg AV VTI:            0.269 m AV Peak Grad:      7.4 mmHg AV Mean Grad:      5.0 mmHg LVOT Vmax:         98.90 cm/s LVOT Vmean:        71.100 cm/s LVOT VTI:          0.188 m LVOT/AV VTI ratio: 0.70 AI PHT:            231 msec  AORTA Ao Root diam: 3.30 cm Ao Asc diam:  2.60 cm MITRAL VALVE  TRICUSPID VALVE MV Area (PHT): 5.54 cm      TR Peak grad:   43.8 mmHg MV Decel Time: 137 msec      TR Vmax:        331.00 cm/s MR Peak grad:    145.0 mmHg MR Mean grad:    98.0 mmHg   SHUNTS MR Vmax:         602.00 cm/s Systemic VTI:  0.19 m MR Vmean:        473.0 cm/s  Systemic Diam: 2.00 cm MR PISA:         2.26 cm MR PISA Eff ROA: 9 mm MR PISA Radius:  0.60 cm MV E velocity: 117.00 cm/s MV A velocity: 168.00 cm/s MV E/A ratio:  0.70 Jenkins Rouge MD Electronically signed by Jenkins Rouge MD Signature Date/Time: 12/11/2020/5:26:29 PM    Final    VAS [place] LOWER EXTREMITY VENOUS (DVT)  Result Date: 12/11/2020  Lower Venous DVT Study Indications: Elevated d-dimer, recent covid infection.  Comparison Study: No previous exams Performing Technologist: Rogelia Rohrer  Examination Guidelines: A complete evaluation includes B-mode imaging, spectral Doppler, color Doppler, and power Doppler as needed of all accessible portions of each vessel.  Bilateral testing is considered an integral part of a complete examination. Limited examinations for reoccurring indications may be performed as noted. The reflux portion of the exam is performed with the patient in reverse Trendelenburg.  +---------+---------------+---------+-----------+----------+--------------+ RIGHT    CompressibilityPhasicitySpontaneityPropertiesThrombus Aging +---------+---------------+---------+-----------+----------+--------------+ CFV      Full           Yes      Yes                                 +---------+---------------+---------+-----------+----------+--------------+ SFJ      Full                                                        +---------+---------------+---------+-----------+----------+--------------+ FV Prox  Full           Yes      Yes                                 +---------+---------------+---------+-----------+----------+--------------+ FV Mid   Full           Yes      Yes                                 +---------+---------------+---------+-----------+----------+--------------+ FV DistalFull           Yes      Yes                                 +---------+---------------+---------+-----------+----------+--------------+ PFV      Full                                                        +---------+---------------+---------+-----------+----------+--------------+ POP  Full           Yes      Yes                                 +---------+---------------+---------+-----------+----------+--------------+ PTV      Full                                                        +---------+---------------+---------+-----------+----------+--------------+ PERO     Full                                                        +---------+---------------+---------+-----------+----------+--------------+   +---------+---------------+---------+-----------+----------+--------------+ LEFT      CompressibilityPhasicitySpontaneityPropertiesThrombus Aging +---------+---------------+---------+-----------+----------+--------------+ CFV      Full           Yes      Yes                                 +---------+---------------+---------+-----------+----------+--------------+ SFJ      Full                                                        +---------+---------------+---------+-----------+----------+--------------+ FV Prox  Full           Yes      Yes                                 +---------+---------------+---------+-----------+----------+--------------+ FV Mid   Full           Yes      Yes                                 +---------+---------------+---------+-----------+----------+--------------+ FV DistalFull           Yes      Yes                                 +---------+---------------+---------+-----------+----------+--------------+ PFV      Full                                                        +---------+---------------+---------+-----------+----------+--------------+ POP      Full           Yes      Yes                                 +---------+---------------+---------+-----------+----------+--------------+ PTV      Full                                                        +---------+---------------+---------+-----------+----------+--------------+  PERO     Full                                                        +---------+---------------+---------+-----------+----------+--------------+     Summary: BILATERAL: - No evidence of deep vein thrombosis seen in the lower extremities, bilaterally. - No evidence of superficial venous thrombosis in the lower extremities, bilaterally. -No evidence of popliteal cyst, bilaterally.   *See table(s) above for measurements and observations. Electronically signed by Harold Barban MD on 12/11/2020 at 10:42:39 PM.    Final

## 2020-12-15 NOTE — Progress Notes (Signed)
[facility])  Referral received for hospice services in the home once discharged.  Spoke with son [name], provided support and answered questions.  Necessary DME is in place and no additional DME is needed at this time.  Once ready for d/c, please arrange for any comfort meds that may be needed so there is no lapse in her comfort prior to hospice services starting.  Venia Carbon RN, BSN, [facility] Liaison

## 2020-12-15 NOTE — TOC Initial Note (Signed)
Transition of Care [facility]) - Initial/Assessment Note    Patient Details  Name: Melinda Olson MRN: [id number] Date of Birth: 1945-09-21  Transition [facility]) CM/SW Contact:    Maebelle Munroe, RN Phone Number: 12/15/2020, 4:20 PM  Clinical Narrative:  [facility] team for discharge planning. Doctor recommends hospice. Met with daughter-in-lawLattie Haw and pt at beside. Pt is alert and pleasantly confused. Lattie Haw asks to meet with pt's son [name] to discuss hospice. Met with both without pt. Son shares that they moved in with parents to care for them. Pt has progressively decline and required more care at home. They have hired private duty assistance in the home. Currently they interested in hospice services. Discussed options and will have hospice liaison contact family. They provided contact information: [name] [phone] and Lattie Haw- [phone]. Son shares that he is also the HCPOA. Advised him to bring in the document to be scanned into the chart. He agrees to provide the [facility] document later today. Spoke to [place] regarding the new referral for hospice. She will outreach to the family to discuss. Will continue to monitor for discharge planning.            Expected Discharge Plan: Home w Hospice Care Barriers to Discharge: No Barriers Identified   Patient Goals and CMS Choice   CMS Medicare.gov Compare Post Acute Care list provided to:: Patient Represenative (must comment) (Son-b [name]; Daughter-in-law- Lattie Haw.) Choice offered to / list presented to : [facility] POA / [place]  Expected Discharge Plan and Services Expected Discharge Plan: [place] In-house Referral: Hospice / Palliative Care Discharge Planning Services: CM Consult Post Acute Care Choice: Hospice Living arrangements for the past 2 months: [place]: Hospice and [place] ([place]) Date [place]:  12/15/20 Time [place]: [id number] Representative spoke with at [place]: Venia Carbon  Prior Living Arrangements/Services Living arrangements for the past 2 months: [place] with:: [place] Patient language and need for interpreter reviewed:: No Do you feel safe going back to the place where you live?: Yes          Current home services: Homehealth aide    Activities of Daily Living Home Assistive Devices/Equipment: Wheelchair ADL Screening (condition at time of admission) Patient's cognitive ability adequate to safely complete daily activities?: No Is the patient deaf or have difficulty hearing?: No Does the patient have difficulty seeing, even when wearing glasses/contacts?: No Does the patient have difficulty concentrating, remembering, or making decisions?: Yes Patient able to express need for assistance with ADLs?: Yes Does the patient have difficulty dressing or bathing?: Yes Independently performs ADLs?: No Communication: Independent Dressing (OT): Independent Grooming: Independent Feeding: Independent Bathing: Needs assistance Is this a change from baseline?: Pre-admission baseline Toileting: Needs assistance Is this a change from baseline?: Pre-admission baseline In/Out Bed: Needs assistance Is this a change from baseline?: Pre-admission baseline Walks in Home: Needs assistance Is this a change from baseline?: Pre-admission baseline Does the patient have difficulty walking or climbing stairs?: Yes Weakness of Legs: Both Weakness of Arms/Hands: None  Permission Sought/Granted Permission sought to share information with : Case Gonzalez granted to share information with : Yes, Hospital doctor (Permission from [employer])  Share Information with NAME: Venia Carbon RN [facility]  Permission  granted to share info w AGENCY: [facility]        Emotional Assessment Appearance:: Appears  stated age   Affect (typically observed): Appropriate,Pleasant Orientation: : Oriented to Self,Oriented to Situation      Admission diagnosis:  Acute diastolic heart failure (HCC) [I50.31] Elevated troponin [R77.8] Acute kidney injury (nontraumatic) (HCC) [N17.9] Acute CHF (congestive heart failure) (HCC) [I50.9] Acute respiratory failure with hypoxia (HCC) [J96.01] Normochromic normocytic anemia [D64.9] Elevated d-dimer [R79.89] CHF (congestive heart failure) ([place]) [I50.9] Patient Active Problem List   Diagnosis Date Noted  . CHF (congestive heart failure) ([place]) 12/12/2020  . Acute CHF (congestive heart failure) ([place]) 12/11/2020  . Hypertensive urgency 12/11/2020  . Acute diastolic heart failure ([place])   . ARF (acute renal failure) ([place]) 09/30/2020  . COVID-19 virus infection 09/30/2020  . Hyperlipidemia 09/30/2020  . Normocytic anemia 09/30/2020  . 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 B12 deficiency 09/23/2020  . Acute CVA (cerebrovascular accident) ([place]) 05/15/2020  . AMS (altered mental status) 05/14/2020  . UTI (urinary tract infection) 05/14/2020  . Leucocytosis 05/14/2020  . Acute kidney injury (nontraumatic) ([place]) 05/14/2020   PCP:  Scheryl Marten, PA Pharmacy:   [id number] [address] [place]Phone: [phone]: [phone] [facility] [address]Phone: [phone]: [phone]    Social Determinants of Health (SDOH) Interventions    Readmission Risk Interventions No flowsheet data found.

## 2020-12-15 NOTE — Progress Notes (Signed)
[facility] Progress Note   Subjective:   No overnight issues.  UOP 1.6L yesterday. Denies nausea, dyspnea.  Lying flat in bed, no family present.   Objective Vitals:   12/14/20 2013 12/15/20 0425 12/15/20 0718 12/15/20 1137  BP: (!) 158/85 (!) 149/87 137/86 (!) 152/94  Pulse: 93 88 89 92  Resp: '20 16 20 18  '$ Temp: 98.7 F (37.1 C) 98.5 F (36.9 C) 97.7 F (36.5 C) 97.8 F (36.6 C)  TempSrc: Oral Oral Oral Oral  SpO2: 94% 98% 96% 98%  Weight:  48.4 kg    Height:       Physical Exam General: sitting up in bed eating full breakfast Heart: RRR, no rub Lungs: clear, on RA Abdomen: soft, nontender Extremities: no edema Neuro: oriented to self and hospital; guesses at year, slow to respond and doesn't know details  Additional Objective Labs: Basic Metabolic Panel: Recent Labs  Lab 12/13/20 0400 12/14/20 0439 12/15/20 0337  NA 140 140 140  K 4.4 4.1 4.1  CL 108 108 110  CO2 '22 22 22  '$ GLUCOSE 94 115* 104*  BUN 88* 86* 74*  CREATININE 3.99* 3.97* 3.37*  CALCIUM 8.6* 8.5* 8.6*   Liver Function Tests: Recent Labs  Lab 12/13/20 0400 12/14/20 0439 12/15/20 0337  AST '16 16 19  '$ ALT '11 12 11  '$ ALKPHOS 66 66 66  BILITOT 1.0 0.8 0.9  PROT 5.7* 5.8* 5.7*  ALBUMIN 3.0* 2.9* 2.9*   No results for input(s): LIPASE, AMYLASE in the last 168 hours. CBC: Recent Labs  Lab 12/12/20 1053 12/12/20 1647 12/13/20 0400 12/14/20 0439 12/15/20 0337  WBC 20.4* 18.4* 13.1* 8.8 9.8  NEUTROABS  --   --  9.2* 5.7 6.0  HGB 7.1* 9.0* 10.4* 10.8* 11.1*  HCT 22.7* 27.7* 31.1* 33.1* 33.3*  MCV 97.8 96.5 92.6 94.6 95.1  PLT 231 241 215 206 222   Blood Culture    Component Value Date/Time   SDES URINE, RANDOM 12/11/2020 2302   SPECREQUEST  12/11/2020 2302    NONE Performed at [facility], [place] [address], [place], [place] [address]    CULT MULTIPLE SPECIES PRESENT, SUGGEST RECOLLECTION (A) 12/11/2020 2302   REPTSTATUS 12/13/2020 FINAL 12/11/2020 2302     Cardiac Enzymes: No results for input(s): CKTOTAL, CKMB, CKMBINDEX, TROPONINI in the last 168 hours. CBG: Recent Labs  Lab 12/12/20 0628  GLUCAP 207*   Iron Studies: No results for input(s): IRON, TIBC, TRANSFERRIN, FERRITIN in the last 72 hours. '@lablastinr3'$ @ Studies/Results: DG Chest Port 1 View  Result Date: 12/15/2020 CLINICAL DATA:  Shortness of breath/unresponsive. EXAM: PORTABLE CHEST 1 VIEW COMPARISON:  12/13/2020 FINDINGS: Lungs are adequately inflated without focal consolidation, effusion or pneumothorax. Interval resolution of previously seen vascular congestion. Mild stable cardiomegaly. Remainder the exam is unchanged. IMPRESSION: 1. No acute cardiopulmonary disease. 2. Mild stable cardiomegaly. Electronically Signed   By: Marin Olp M.D.   On: 12/15/2020 08:49   Medications:  . aspirin  81 mg Oral Daily  . atorvastatin  40 mg Oral Daily  . folic acid  1 mg Oral Daily  . memantine  10 mg Oral BID  . pantoprazole (PROTONIX) IV  40 mg Intravenous Q12H  . senna-docusate  1 tablet Oral QHS  . thiamine  100 mg Oral Daily  . cyanocobalamin  1,000 mcg Oral Daily    Assessment/Plan **Acute hypoxic respiratory failure: CXR with pulmonary edema with BP > 200.  Improved with diuresis and BP control.  VQ low  risk for PE, LE duplex neg DVT.  TTE normal EF, grade 2 DD.    **AKI on CKD:  Quite labile renal function lately but near recent baseline in the mid 2s.  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 Renal US CKD recently and bladder scan ok here.   Cr was up from 2.8 at presentation to 4 yesterday but improved to 3.4 today.  AKI appears to be hemodynamically mediated with later swings in BP.   I had a nice conversation with son 3/30 about where things stand - manage medically but not advance to dialysis if develops renal failure; son in agreement.  Have been dosing diuretics PRN at this time - she rec'd lasix 40 po this AM and we can see how she does with that.  She will  probably need a diuretic at d/c to avoid repeat hospitalization.  Avoid nephrotoxin, avoid hypotension, avoid hypovolemia.   **HTN urgency: SBP > 200 on presentation.  Normotensive off meds now.  **Anemia:  Hb 9.3 down to 7.1 and improved to 10.4 > 11. after transfusion. no obvious bleeding. Heparin gtt and ASA held.    **Dementia: on namenda   NO plans for dialysis per my discussion with son on 3/30.  Per notes primary has d/w son -- if continued clinical decline plan for comfort care which is very reasonable.  Has f/u with Dr. Joelyn Oms at [facility] office 12/20/20 -- depending on clinical course this may need to be moved.   Jannifer Hick MD 12/15/2020, 12:19 PM  [facility] Pager: [phone]

## 2020-12-16 MED ORDER — ISOSORBIDE MONONITRATE ER 30 MG PO TB24: 30.0000 mg | ORAL_TABLET | Freq: Every day | ORAL | 0 refills | Status: AC

## 2020-12-16 MED ORDER — ALBUTEROL SULFATE HFA 108 (90 BASE) MCG/ACT IN AERS: 2.0000 | INHALATION_SPRAY | Freq: Four times a day (QID) | RESPIRATORY_TRACT | 0 refills | Status: AC | PRN

## 2020-12-16 MED ORDER — CARVEDILOL 3.125 MG PO TABS: 3.1250 mg | ORAL_TABLET | Freq: Two times a day (BID) | ORAL | 11 refills | Status: AC

## 2020-12-16 MED ORDER — ISOSORBIDE MONONITRATE ER 30 MG PO TB24
30.0000 mg | ORAL_TABLET | Freq: Every day | ORAL | Status: DC
  Administered 2020-12-16: 30 mg via ORAL
  Filled 2020-12-16: qty 1

## 2020-12-16 MED ORDER — CARVEDILOL 3.125 MG PO TABS
3.1250 mg | ORAL_TABLET | Freq: Two times a day (BID) | ORAL | Status: DC
  Administered 2020-12-16: 3.125 mg via ORAL

## 2020-12-16 MED ORDER — AMLODIPINE BESYLATE 10 MG PO TABS: 10.0000 mg | ORAL_TABLET | Freq: Every day | ORAL | 0 refills | Status: AC

## 2020-12-16 MED ORDER — AMLODIPINE BESYLATE 10 MG PO TABS
10.0000 mg | ORAL_TABLET | Freq: Every day | ORAL | Status: DC
  Administered 2020-12-16: 10 mg via ORAL
  Filled 2020-12-16: qty 1

## 2020-12-16 MED ORDER — FUROSEMIDE 40 MG PO TABS: 20.0000 mg | ORAL_TABLET | Freq: Every day | ORAL | 0 refills | Status: AC

## 2020-12-16 MED ORDER — PANTOPRAZOLE SODIUM 40 MG PO TBEC: 40.0000 mg | DELAYED_RELEASE_TABLET | Freq: Every day | ORAL | 0 refills | Status: AC

## 2020-12-16 NOTE — Plan of Care (Signed)

## 2020-12-16 NOTE — Progress Notes (Signed)
[facility])  Chart and pt information have been reviewed by [facility] physician.  Hospice eligibility confirmed.  [place] send signed and completed DNR home with pt/family.  Please provide prescriptions at discharge as needed to ensure ongoing symptom management until pt can be admitted onto hospice.    DME needs discussed with pt's son by my colleague Venia Carbon; no new DME at this time.  Thank you for the opportunity to participate in this pt's care.  Domenic Moras, BSN, RN [employer] [phone] (24h on call)

## 2020-12-16 NOTE — Discharge Summary (Signed)
Melinda Olson [id number] DOB: 1945/10/12 DOA: 12/11/2020  PCP: Scheryl Marten, PA  Admit date: 12/11/2020  Discharge date: 12/16/2020  Admitted From: Home   Disposition:  Home Hospice   Recommendations for Outpatient Follow-up:   Follow up with PCP in 1-2 weeks  PCP Please obtain BMP/CBC, 2 view CXR in 1week,  (see Discharge instructions)   PCP Please follow up on the following pending results:    Home Health: None   Equipment/Devices: None  Consultations: Renal Discharge Condition: Fair   CODE STATUS: DNR   Diet Recommendation: Soft  Chief Complaint  Patient presents with  . Shortness of Breath     Brief history of present illness from the day of admission and additional interim summary     1. Acute Hypoxic Resp. 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  Has been placed on low-dose Lasix as well upon discharge.    2.  Mild non-ACS pattern troponin elevation.  Likely due to in the setting of AKI on CKD 4.  Chest pain-free nonacute EKG, on  Coreg, statin and aspirin  3.  AKI on CKD 4.  Baseline creatinine between 1.7-2 - noted recent renal ultrasound showing medical disease, nephrology was consulted, patient deemed not a HD candidate due to her poor underlying health condition, supportive care only with low-dose Lasix, if  significant decline full comfort measures being discharged with home hospice  4.  Hypertensive emergency.  Much improved could have been high due to shortness of breath, now placed on combination of Norvasc, Coreg and Imdur.  5.  Dyslipidemia.  On statin.  6.  History of dementia.  On memantine.  Monitor.  Has developed some toxic encephalopathy, supportive care, will remain at risk for delirium.  Minimize narcotics and benzodiazepines.  7.  Elevated D-dimer.  Due to inflammation VQ scan and lower extremity venous duplex negative.  8. History of CVA with left-sided hemiplegia - on statins, resumed aspirin with caution.  9.  Acute on chronic drop in H&H.  No evidence of active ongoing bleed, received 2 units of packed RBC, placed on PPI and H&H now stable.  Not a candidate for invasive testing or procedures, if declines comfort measures.  Hospital Course        Discharge diagnosis     Principal Problem:   Acute CHF (congestive heart failure) (HCC) Active Problems:   Acute kidney injury (nontraumatic) (HCC)   Mixed dementia (HCC)   Hypertensive urgency   CHF (congestive heart failure) [facility])    Discharge instructions    Discharge Instructions    Discharge instructions   Complete by: As directed    Disposition.  Residential hospice Condition.  Guarded CODE STATUS.  DNR Activity.  With assistance as tolerated, full fall precautions. Diet.  Soft with feeding assistance and aspiration precautions. Goal of care.  Comfort.   Increase activity slowly   Complete by: As directed       Discharge Medications   Allergies as of 12/16/2020   No Known Allergies     Medication List    TAKE these medications   albuterol 108 (90 Base) MCG/ACT inhaler Commonly known as: VENTOLIN HFA Inhale 2 puffs into the lungs every 6 (six) hours as needed for wheezing or shortness of breath.   amLODipine 10 MG  tablet Commonly known as: NORVASC Take 1 tablet (10 mg total) by mouth daily. What changed:   medication strength  how much to take   aspirin 81 MG EC tablet Take 1 tablet (81 mg total) by mouth daily. Swallow whole.   atorvastatin 40 MG tablet Commonly known as: LIPITOR Take 1 tablet (40 mg total) by mouth daily.   carvedilol 3.125 MG tablet Commonly known as: Coreg Take 1 tablet (3.125 mg total) by mouth 2 (two) times daily.   cyanocobalamin 1000 MCG tablet Take 1 tablet (1,000 mcg total) by mouth daily.   folic acid 1 MG tablet Commonly known as: FOLVITE Take 1 tablet (1 mg total) by mouth daily.   furosemide 40 MG tablet Commonly known as: Lasix Take 0.5 tablets (20 mg total) by mouth daily.   isosorbide mononitrate 30 MG 24 hr tablet Commonly known as: IMDUR Take 1 tablet (30 mg total) by mouth daily.   memantine 10 MG tablet Commonly known as: Namenda Take 1 tablet (10 mg total) by mouth 2 (two) times daily. What changed: Another medication with the same name was removed. Continue taking this medication, and follow the directions you see here.   multivitamin with minerals Tabs tablet Take 1 tablet by mouth daily.   pantoprazole 40 MG tablet Commonly known as: Protonix Take 1 tablet (40 mg total) by mouth daily.   senna-docusate 8.6-50 MG tablet Commonly known as: Senokot-S Take 2 tablets by mouth at bedtime as needed for mild constipation. What changed:   how much to take  when to take this   thiamine 100 MG tablet Take 1 tablet (100 mg total) by mouth daily.         Major procedures and Radiology Reports - PLEASE review detailed and final reports thoroughly  -       US RENAL  Result Date: 11/30/2020 CLINICAL DATA:  Chronic renal disease EXAM: RENAL / URINARY TRACT ULTRASOUND COMPLETE COMPARISON:  None. FINDINGS: Right Kidney: Renal measurements: 8 x 2.8 x 3.4 cm = volume: 39.2 mL. Increased cortical echogenicity. Left Kidney: Renal  measurements: 7.8 x 3.5 x 4.0 cm = volume: 58.1 mL. Increased cortical echogenicity. Bladder: Appears normal for degree of bladder distention. Other: None. IMPRESSION: Increased cortical echogenicity bilaterally. The kidneys are smaller than normal. The findings are consistent with medical renal disease. Electronically Signed   By: Dorise Bullion III M.D  On: 11/30/2020 21:15   NM Pulmonary Perf and Vent  Result Date: 12/11/2020 CLINICAL DATA:  Dyspnea with elevated D-dimer EXAM: NUCLEAR MEDICINE PERFUSION LUNG SCAN TECHNIQUE: Perfusion images were obtained in multiple projections after intravenous injection of radiopharmaceutical. Views: Anterior, posterior, left lateral, right lateral, RPO, LPO, LAO, RAO RADIOPHARMACEUTICALS:  4.15 mCi Tc-25mMAA IV COMPARISON:  Chest radiograph December 11, 2020 FINDINGS: Radiotracer uptake bilaterally is homogeneous and symmetric. No perfusion defects evident. IMPRESSION: No perfusion defects evident on either side. No findings indicative of pulmonary embolus. Electronically Signed   By: WLowella GripIII M.D.   On: 12/11/2020 11:01   DG Chest Port 1 View  Result Date: 12/15/2020 CLINICAL DATA:  Shortness of breath/unresponsive. EXAM: PORTABLE CHEST 1 VIEW COMPARISON:  12/13/2020 FINDINGS: Lungs are adequately inflated without focal consolidation, effusion or pneumothorax. Interval resolution of previously seen vascular congestion. Mild stable cardiomegaly. Remainder the exam is unchanged. IMPRESSION: 1. No acute cardiopulmonary disease. 2. Mild stable cardiomegaly. Electronically Signed   By: DMarin OlpM.D.   On: 12/15/2020 08:49   DG Chest Port 1 View  Result Date: 12/13/2020 CLINICAL DATA:  Shortness of breath Wheezing EXAM: PORTABLE CHEST 1 VIEW COMPARISON:  12/12/2020 FINDINGS: Mild cardiomegaly and pulmonary vascular congestion. Small right pleural effusion. Bilateral lung opacities likely due to pulmonary edema. IMPRESSION: Radiographic findings most  consistent with CHF/fluid volume overload. Electronically Signed   By: FMiachel RouxM.D.   On: 12/13/2020 08:00   DG Chest Port 1 View  Result Date: 12/12/2020 CLINICAL DATA:  Shortness of breath. EXAM: PORTABLE CHEST 1 VIEW COMPARISON:  Chest x-ray 02/10/2021. FINDINGS: Cardiomegaly. Diffuse bilateral pulmonary infiltrates consistent with pulmonary edema. Findings consistent CHF. Tiny bilateral pleural effusions may be present. No pneumothorax. Aortic atherosclerotic vascular calcification. IMPRESSION: Cardiomegaly with diffuse bilateral pulmonary infiltrates consistent with pulmonary edema. Findings consistent CHF. Tiny bilateral pleural effusions may be present. Electronically Signed   By: TMarcello Moores Register   On: 12/12/2020 09:07   DG Chest Port 1 View  Result Date: 12/11/2020 CLINICAL DATA:  Dyspnea EXAM: PORTABLE CHEST 1 VIEW COMPARISON:  09/30/2020 FINDINGS: The lungs are symmetrically well expanded. Since the prior examination, there has developed trace diffuse interstitial pulmonary infiltrate, most in keeping with trace pulmonary edema, likely cardiogenic in nature. Cardiac size is mildly enlarged. No pneumothorax or pleural effusion. No acute bone abnormality. IMPRESSION: Interval development of mild pulmonary edema, likely cardiogenic in nature. Electronically Signed   By: AFidela SalisburyMD   On: 12/11/2020 01:02   ECHOCARDIOGRAM COMPLETE  Result Date: 12/11/2020    ECHOCARDIOGRAM REPORT   Patient Name:   Melinda OTEYDate of Exam: 12/11/2020 Medical Rec #:  [id number]    Height:       60.0 in Accession #:    [id number]   Weight:       108.0 lb Date of Birth:  2June 14, 1947    BSA:          1.437 m Patient Age:    89years      BP:           117/84 mmHg Patient Gender: F             HR:           87 bpm. Exam Location:  Inpatient Procedure: 2D Echo Indications:    congestive heart failure  History:        Patient has prior history of Echocardiogram examinations, most  recent  05/15/2020.  Sonographer:    Luisa Hart RDCS Referring Phys: [place]  1. Left ventricular ejection fraction, by estimation, is 60 to 65%. The left ventricle has normal function. The left ventricle has no regional wall motion abnormalities. The left ventricular internal cavity size was mildly dilated. There is severe left ventricular hypertrophy. Left ventricular diastolic parameters are consistent with Grade II diastolic dysfunction (pseudonormalization). Elevated left ventricular end-diastolic pressure.  2. Right ventricular systolic function is normal. The right ventricular size is normal. There is moderately elevated pulmonary artery systolic pressure.  3. Left atrial size was mildly dilated.  4. A small pericardial effusion is present. The pericardial effusion is localized near the right atrium.  5. Degree of MR worse compared to echo done 05/15/20 ? restricted posterior leaflet motion . The mitral valve is abnormal. Moderate to severe mitral valve regurgitation. No evidence of mitral stenosis.  6. The aortic valve was not well visualized. Aortic valve regurgitation is moderate. No aortic stenosis is present.  7. The inferior vena cava is normal in size with greater than 50% respiratory variability, suggesting right atrial pressure of 3 mmHg. FINDINGS  Left Ventricle: Left ventricular ejection fraction, by estimation, is 60 to 65%. The left ventricle has normal function. The left ventricle has no regional wall motion abnormalities. The left ventricular internal cavity size was mildly dilated. There is  severe left ventricular hypertrophy. Left ventricular diastolic parameters are consistent with Grade II diastolic dysfunction (pseudonormalization). Elevated left ventricular end-diastolic pressure. Right Ventricle: The right ventricular size is normal. No increase in right ventricular wall thickness. Right ventricular systolic function is normal. There is moderately elevated pulmonary  artery systolic pressure. The tricuspid regurgitant velocity is 3.31 m/s, and with an assumed right atrial pressure of 15 mmHg, the estimated right ventricular systolic pressure is 0000000 mmHg. Left Atrium: Left atrial size was mildly dilated. Right Atrium: Right atrial size was normal in size. Pericardium: A small pericardial effusion is present. The pericardial effusion is localized near the right atrium. Mitral Valve: Degree of MR worse compared to echo done 05/15/20 ? restricted posterior leaflet motion. The mitral valve is abnormal. There is mild thickening of the mitral valve leaflet(s). There is mild calcification of the mitral valve leaflet(s). Moderate to severe mitral valve regurgitation. No evidence of mitral valve stenosis. Tricuspid Valve: The tricuspid valve is normal in structure. Tricuspid valve regurgitation is mild . No evidence of tricuspid stenosis. Aortic Valve: The aortic valve was not well visualized. Aortic valve regurgitation is moderate. Aortic regurgitation PHT measures 231 msec. No aortic stenosis is present. Aortic valve mean gradient measures 5.0 mmHg. Aortic valve peak gradient measures 7.4 mmHg. Aortic valve area, by VTI measures 2.20 cm. Pulmonic Valve: The pulmonic valve was normal in structure. Pulmonic valve regurgitation is mild. No evidence of pulmonic stenosis. Aorta: The aortic root is normal in size and structure. Venous: The inferior vena cava is normal in size with greater than 50% respiratory variability, suggesting right atrial pressure of 3 mmHg. IAS/Shunts: No atrial level shunt detected by color flow Doppler.  LEFT VENTRICLE PLAX 2D LVIDd:         3.90 cm     Diastology LVIDs:         3.00 cm     LV e' medial:    3.05 cm/s LV PW:         1.60 cm     LV E/e' medial:  38.4 LV IVS:  1.80 cm     LV e' lateral:   2.39 cm/s LVOT diam:     2.00 cm     LV E/e' lateral: 49.0 LV SV:         59 LV SV Index:   41 LVOT Area:     3.14 cm  LV Volumes (MOD) LV vol d, MOD A2C: 73.9  ml LV vol d, MOD A4C: 74.5 ml LV vol s, MOD A2C: 37.2 ml LV vol s, MOD A4C: 42.7 ml LV SV MOD A2C:     36.7 ml LV SV MOD A4C:     74.5 ml LV SV MOD BP:      37.0 ml RIGHT VENTRICLE RV S prime:     12.20 cm/s  PULMONARY VEINS TAPSE (M-mode): 1.8 cm      A Reversal Duration: 103.00 msec                             A Reversal Velocity: 18.90 cm/s                             Diastolic Velocity:  0000000 cm/s                             S/D Velocity:        0.60                             Systolic Velocity:   [id number] cm/s LEFT ATRIUM             Index       RIGHT ATRIUM           Index LA diam:        4.10 cm 2.85 cm/m  RA Area:     10.30 cm LA Vol (A2C):   50.4 ml 35.08 ml/m RA Volume:   20.40 ml  14.20 ml/m LA Vol (A4C):   52.5 ml 36.54 ml/m LA Biplane Vol: 50.5 ml 35.15 ml/m  AORTIC VALVE                    PULMONIC VALVE AV Area (Vmax):    2.28 cm     PV Vmax:       0.82 m/s AV Area (Vmean):   2.19 cm     PV Vmean:      75.400 cm/s AV Area (VTI):     2.20 cm     PV VTI:        0.213 m AV Vmax:           136.00 cm/s  PV Peak grad:  2.7 mmHg AV Vmean:          102.000 cm/s PV Mean grad:  3.0 mmHg AV VTI:            0.269 m AV Peak Grad:      7.4 mmHg AV Mean Grad:      5.0 mmHg LVOT Vmax:         98.90 cm/s LVOT Vmean:        71.100 cm/s LVOT VTI:          0.188 m LVOT/AV VTI ratio: 0.70 AI PHT:            231 msec  AORTA Ao Root diam: 3.30  cm Ao Asc diam:  2.60 cm MITRAL VALVE                 TRICUSPID VALVE MV Area (PHT): 5.54 cm      TR Peak grad:   43.8 mmHg MV Decel Time: 137 msec      TR Vmax:        331.00 cm/s MR Peak grad:    145.0 mmHg MR Mean grad:    98.0 mmHg   SHUNTS MR Vmax:         602.00 cm/s Systemic VTI:  0.19 m MR Vmean:        473.0 cm/s  Systemic Diam: 2.00 cm MR PISA:         2.26 cm MR PISA Eff ROA: 9 mm MR PISA Radius:  0.60 cm MV E velocity: 117.00 cm/s MV A velocity: 168.00 cm/s MV E/A ratio:  0.70 Jenkins Rouge MD Electronically signed by Jenkins Rouge MD Signature Date/Time:  12/11/2020/5:26:29 PM    Final    VAS [place] LOWER EXTREMITY VENOUS (DVT)  Result Date: 12/11/2020  Lower Venous DVT Study Indications: Elevated d-dimer, recent covid infection.  Comparison Study: No previous exams Performing Technologist: Rogelia Rohrer  Examination Guidelines: A complete evaluation includes B-mode imaging, spectral Doppler, color Doppler, and power Doppler as needed of all accessible portions of each vessel. Bilateral testing is considered an integral part of a complete examination. Limited examinations for reoccurring indications may be performed as noted. The reflux portion of the exam is performed with the patient in reverse Trendelenburg.  +---------+---------------+---------+-----------+----------+--------------+ RIGHT    CompressibilityPhasicitySpontaneityPropertiesThrombus Aging +---------+---------------+---------+-----------+----------+--------------+ CFV      Full           Yes      Yes                                 +---------+---------------+---------+-----------+----------+--------------+ SFJ      Full                                                        +---------+---------------+---------+-----------+----------+--------------+ FV Prox  Full           Yes      Yes                                 +---------+---------------+---------+-----------+----------+--------------+ FV Mid   Full           Yes      Yes                                 +---------+---------------+---------+-----------+----------+--------------+ FV DistalFull           Yes      Yes                                 +---------+---------------+---------+-----------+----------+--------------+ PFV      Full                                                        +---------+---------------+---------+-----------+----------+--------------+  POP      Full           Yes      Yes                                  +---------+---------------+---------+-----------+----------+--------------+ PTV      Full                                                        +---------+---------------+---------+-----------+----------+--------------+ PERO     Full                                                        +---------+---------------+---------+-----------+----------+--------------+   +---------+---------------+---------+-----------+----------+--------------+ LEFT     CompressibilityPhasicitySpontaneityPropertiesThrombus Aging +---------+---------------+---------+-----------+----------+--------------+ CFV      Full           Yes      Yes                                 +---------+---------------+---------+-----------+----------+--------------+ SFJ      Full                                                        +---------+---------------+---------+-----------+----------+--------------+ FV Prox  Full           Yes      Yes                                 +---------+---------------+---------+-----------+----------+--------------+ FV Mid   Full           Yes      Yes                                 +---------+---------------+---------+-----------+----------+--------------+ FV DistalFull           Yes      Yes                                 +---------+---------------+---------+-----------+----------+--------------+ PFV      Full                                                        +---------+---------------+---------+-----------+----------+--------------+ POP      Full           Yes      Yes                                 +---------+---------------+---------+-----------+----------+--------------+ PTV  Full                                                        +---------+---------------+---------+-----------+----------+--------------+ PERO     Full                                                         +---------+---------------+---------+-----------+----------+--------------+     Summary: BILATERAL: - No evidence of deep vein thrombosis seen in the lower extremities, bilaterally. - No evidence of superficial venous thrombosis in the lower extremities, bilaterally. -No evidence of popliteal cyst, bilaterally.   *See table(s) above for measurements and observations. Electronically signed by Harold Barban MD on 12/11/2020 at 10:42:39 PM.    Final     Micro Results     Recent Results (from the past 240 hour(s))  SARS CORONAVIRUS 2 (TAT 6-24 HRS) Nasopharyngeal Nasopharyngeal Swab     Status: None   Collection Time: 12/11/20  4:07 AM   Specimen: Nasopharyngeal Swab  Result Value Ref Range Status   SARS Coronavirus 2 NEGATIVE NEGATIVE Final    Comment: (NOTE) SARS-CoV-2 target nucleic acids are NOT DETECTED.  The SARS-CoV-2 RNA is generally detectable in upper and lower respiratory specimens during the acute phase of infection. Negative results do not preclude SARS-CoV-2 infection, do not rule out co-infections with other pathogens, and should not be used as the sole basis for treatment or other patient management decisions. Negative results must be combined with clinical observations, patient history, and epidemiological information. The expected result is Negative.  Fact Sheet for Patients: SugarRoll.be  Fact Sheet for Healthcare Providers: https://www.woods-mathews.com/  This test is not yet approved or cleared by the [place] FDA and  has been authorized for detection and/or diagnosis of SARS-CoV-2 by FDA under an Emergency Use Authorization (EUA). This EUA will remain  in effect (meaning this test can be used) for the duration of the COVID-19 declaration under Se ction 564(b)(1) of the Act, 21 U.S.C. section 360bbb-3(b)(1), unless the authorization is terminated or revoked sooner.  Performed at [facility], [place] [address], [place], [place] [address]   Culture, Urine     Status: Abnormal   Collection Time: 12/11/20 11:02 PM   Specimen: Urine, Random  Result Value Ref Range Status   Specimen Description URINE, RANDOM  Final   Special Requests   Final    NONE Performed at [facility], [place] [address] [place], [place] [address]    Culture MULTIPLE SPECIES PRESENT, SUGGEST RECOLLECTION (A)  Final   Report Status 12/13/2020 FINAL  Final    Today   Subjective    Melinda Olson today has no headache,no chest abdominal pain,no new weakness tingling or numbness, feels much better wants to go home today.     Objective   Blood pressure (!) 162/98, pulse 98, temperature 98.2 F (36.8 C), temperature source Oral, resp. rate 18, height 5' (1.524 m), weight 47.8 kg, SpO2 96 %.   Intake/Output Summary (Last 24 hours) at 12/16/2020 0825 Last data filed at 12/16/2020 0522 Gross per 24 hour  Intake 240 ml  Output 850 ml  Net -610 ml  Exam  Awake, pleasantly confused but in no distress, moving all 4 extremities by herself [place].AT,PERRAL Supple Neck,No JVD, No cervical lymphadenopathy appriciated.  Symmetrical Chest wall movement, Good air movement bilaterally, CTAB RRR,No Gallops,Rubs or new Murmurs, No Parasternal Heave +ve B.Sounds, Abd Soft, Non tender, No organomegaly appriciated, No rebound -guarding or rigidity. No Cyanosis, Clubbing or edema, No new Rash or bruise   Data Review   CBC w Diff:  Lab Results  Component Value Date   WBC 9.8 12/15/2020   HGB 11.1 (L) 12/15/2020   HCT 33.3 (L) 12/15/2020   HCT 25.8 (L) 05/15/2020   PLT 222 12/15/2020   LYMPHOPCT 22 12/15/2020   MONOPCT 9 12/15/2020   EOSPCT 6 12/15/2020   BASOPCT 1 12/15/2020    CMP:  Lab Results  Component Value Date   NA 140 12/15/2020   K 4.1 12/15/2020   CL 110 12/15/2020   CO2 22 12/15/2020   BUN 74 (H) 12/15/2020   CREATININE 3.37 (H) 12/15/2020   PROT 5.7 (L) 12/15/2020   ALBUMIN 2.9 (L) 12/15/2020   BILITOT  0.9 12/15/2020   ALKPHOS 66 12/15/2020   AST 19 12/15/2020   ALT 11 12/15/2020  .   Total Time in preparing paper work, data evaluation and todays exam - 38 minutes  Lala Lund M.D on 12/16/2020 at 8:25 AM  [facility]

## 2020-12-16 NOTE — Progress Notes (Signed)
Physical Therapy Treatment Patient Details Name: Melinda Olson MRN: [id number] DOB: August 31, 1946 Today's Date: 12/16/2020    History of Present Illness Pt is a 75 y.o. female admitted 12/11/20 with SOB. Workup for acute hypoxic respiratory failure due to acute on chronic HF, AKI on CKD. 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   PT Comments    Pt progressing with mobility. Tolerated OOB activity, including transfer to recliner with modA; pt denies dizziness with minimal DOE noted this session. Pt with bladder/bowel incontinence, dependent for pericare and requiring assist gown change. Per chart, pt to d/c home with hospice today. If to remain admitted, will continue to follow acutely.   Follow Up Recommendations  No PT follow up;Supervision for mobility/OOB (home w/ hospice)     Equipment Recommendations  None recommended by PT    Recommendations for Other Services       Precautions / Restrictions Precautions Precautions: Fall;Other (comment) Precaution Comments: Bladder/bowel incontinence; flexed posture Restrictions Weight Bearing Restrictions: No    Mobility  Bed Mobility Overal bed mobility: Needs Assistance Bed Mobility: Supine to Sit     Supine to sit: Mod assist;Min assist     General bed mobility comments: ModA to assist BLEs to EOB, minA for trunk elevation; LOB back into sidelying requiring minA for trunk elevation; hand over hand assist to place BUEs at side on bed to maintain balance    Transfers Overall transfer level: Needs assistance Equipment used: 1 person hand held assist Transfers: Stand Pivot Transfers;Sit to/from Stand Sit to Stand: Mod assist Stand pivot transfers: Mod assist       General transfer comment: Initial stand pivot transfer from bed to recliner with modA for trunk elevation and maintaining upright, pt with difficulty taking steps; additional stand from  recliner with modA  Ambulation/Gait                 Stairs             Wheelchair Mobility    Modified Rankin (Stroke Patients Only)       Balance Overall balance assessment: Needs assistance Sitting-balance support: No upper extremity supported;Feet supported;Bilateral upper extremity supported Sitting balance-Leahy Scale: Poor Sitting balance - Comments: Reliant on UE support to maintain seated balance   Standing balance support: Single extremity supported Standing balance-Leahy Scale: Poor Standing balance comment: Reliant on UE support and external assist; dependent for posterior hygiene                            Cognition Arousal/Alertness: Awake/alert Behavior During Therapy: Flat affect Overall Cognitive Status: History of cognitive impairments - at baseline                                 General Comments: Following majority of simple commands. Pt laughing inappropriately at times; per chart, h/o emotional lability and memory deficits      Exercises      General Comments General comments (skin integrity, edema, etc.): Pt with bladder/bowel incontinence, dependent for pericare and linen change; required assist for meal set-up then able to feed self      Pertinent Vitals/Pain Pain Assessment: No/denies pain    Home Living                      Prior Function  PT Goals (current goals can now be found in the care plan section) Progress towards PT goals: Progressing toward goals    Frequency    Min 3X/week      PT Plan Current plan remains appropriate    Co-evaluation              AM-PAC PT "6 Clicks" Mobility   Outcome Measure  Help needed turning from your back to your side while in a flat bed without using bedrails?: A Lot Help needed moving from lying on your back to sitting on the side of a flat bed without using bedrails?: A Lot Help needed moving to and from a bed to a chair  (including a wheelchair)?: A Lot Help needed standing up from a chair using your arms (e.g., wheelchair or bedside chair)?: A Lot Help needed to walk in hospital room?: Total Help needed climbing 3-5 steps with a railing? : Total 6 Click Score: 10    End of Session Equipment Utilized During Treatment: Gait belt Activity Tolerance: Patient tolerated treatment well Patient left: in chair;with call bell/phone within reach;with chair alarm set Nurse Communication: Mobility status PT Visit Diagnosis: Other abnormalities of gait and mobility (R26.89);Muscle weakness (generalized) (M62.81)     Time: 0812-0829 PT Time Calculation (min) (ACUTE ONLY): 17 min  Charges:  $Therapeutic Activity: 8-22 mins                     Mabeline Caras, PT, DPT Acute Rehabilitation Services  Pager [phone] Office [place] 12/16/2020, 8:47 AM

## 2020-12-16 NOTE — TOC Transition Note (Signed)
Transition of Care [facility]) - CM/SW Discharge Note   Patient Details  Name: Melinda Olson MRN: [id number] Date of Birth: [birth year]  [facility]) CM/SW Contact:  Zenon Mayo, RN Phone Number: 12/16/2020, 9:31 AM   Clinical Narrative:    Patient is for dc today home with Hospice with [facility].  NCM spoke with patient and son.  They do not need any DME and per Venia Carbon note with [facility] no DME needed.  Son states they will transport patient home via car. His wife will be her at 30 to transport her home. NCM notified  Chrislyn of the dc today, she states they will admit patient tomorrow.   Final next level of care: Home w Hospice Care Barriers to Discharge: No Barriers Identified   Patient Goals and CMS Choice Patient states their goals for this hospitalization and ongoing recovery are:: home with hospice CMS Medicare.gov Compare Post Acute Care list provided to:: Patient Represenative (must comment) (Son-b [name]; Daughter-in-law- Lattie Haw.) Choice offered to / list presented to : [place] / [place] Children  Discharge Placement                       Discharge Plan and Services In-house Referral: Hospice / Palliative Care Discharge Planning Services: CM Consult Post Acute Care Choice: Hospice                    HH Arranged: RN [place] Date [facility] Agency Contacted: 12/16/20 Time [place]: [phone] Representative spoke with at [place]: [place] ([place]) Interventions     Readmission Risk Interventions No flowsheet data found.

## 2020-12-16 NOTE — Care Management Important Message (Signed)
Important Message  Patient Details  Name: Melinda Olson MRN: [id number] Date of Birth: 1946-07-19   Medicare Important Message Given:  Yes     Shelda Altes 12/16/2020, 9:12 AM

## 2020-12-16 NOTE — Progress Notes (Signed)
[facility] Progress Note   Subjective:   No overnight issues. For d/c today Objective Vitals:   12/15/20 1941 12/16/20 0044 12/16/20 0414 12/16/20 0500  BP: 138/88  (!) 162/98   Pulse: 96  98   Resp: 20  18   Temp: 97.8 F (36.6 C)  98.2 F (36.8 C)   TempSrc: Oral  Oral   SpO2: 95%  96%   Weight:  47.8 kg  47.8 kg  Height:       Physical Exam General: sitting up in chair Heart: RRR, no rub Lungs: clear, on RA Abdomen: soft, nontender Extremities: no edema Neuro: oriented to self and hospital; some delayed processing  Additional Objective Labs: Basic Metabolic Panel: Recent Labs  Lab 12/13/20 0400 12/14/20 0439 12/15/20 0337  NA 140 140 140  K 4.4 4.1 4.1  CL 108 108 110  CO2 '22 22 22  '$ GLUCOSE 94 115* 104*  BUN 88* 86* 74*  CREATININE 3.99* 3.97* 3.37*  CALCIUM 8.6* 8.5* 8.6*   Liver Function Tests: Recent Labs  Lab 12/13/20 0400 12/14/20 0439 12/15/20 0337  AST '16 16 19  '$ ALT '11 12 11  '$ ALKPHOS 66 66 66  BILITOT 1.0 0.8 0.9  PROT 5.7* 5.8* 5.7*  ALBUMIN 3.0* 2.9* 2.9*   No results for input(s): LIPASE, AMYLASE in the last 168 hours. CBC: Recent Labs  Lab 12/12/20 1053 12/12/20 1647 12/13/20 0400 12/14/20 0439 12/15/20 0337  WBC 20.4* 18.4* 13.1* 8.8 9.8  NEUTROABS  --   --  9.2* 5.7 6.0  HGB 7.1* 9.0* 10.4* 10.8* 11.1*  HCT 22.7* 27.7* 31.1* 33.1* 33.3*  MCV 97.8 96.5 92.6 94.6 95.1  PLT 231 241 215 206 222   Blood Culture    Component Value Date/Time   SDES URINE, RANDOM 12/11/2020 2302   SPECREQUEST  12/11/2020 2302    NONE Performed at [facility], [place] [address] [place], [place] [address]    CULT MULTIPLE SPECIES PRESENT, SUGGEST RECOLLECTION (A) 12/11/2020 2302   REPTSTATUS 12/13/2020 FINAL 12/11/2020 2302    Cardiac Enzymes: No results for input(s): CKTOTAL, CKMB, CKMBINDEX, TROPONINI in the last 168 hours. CBG: Recent Labs  Lab 12/12/20 0628  GLUCAP 207*   Iron Studies: No results for input(s):  IRON, TIBC, TRANSFERRIN, FERRITIN in the last 72 hours. '@lablastinr3'$ @ Studies/Results: DG Chest Port 1 View  Result Date: 12/15/2020 CLINICAL DATA:  Shortness of breath/unresponsive. EXAM: PORTABLE CHEST 1 VIEW COMPARISON:  12/13/2020 FINDINGS: Lungs are adequately inflated without focal consolidation, effusion or pneumothorax. Interval resolution of previously seen vascular congestion. Mild stable cardiomegaly. Remainder the exam is unchanged. IMPRESSION: 1. No acute cardiopulmonary disease. 2. Mild stable cardiomegaly. Electronically Signed   By: Marin Olp M.D.   On: 12/15/2020 08:49   Medications:  . amLODipine  10 mg Oral Daily  . aspirin  81 mg Oral Daily  . atorvastatin  40 mg Oral Daily  . carvedilol  3.125 mg Oral BID WC  . folic acid  1 mg Oral Daily  . isosorbide mononitrate  30 mg Oral Daily  . memantine  10 mg Oral BID  . pantoprazole (PROTONIX) IV  40 mg Intravenous Q12H  . senna-docusate  1 tablet Oral QHS  . thiamine  100 mg Oral Daily  . cyanocobalamin  1,000 mcg Oral Daily    Assessment/Plan **Acute hypoxic respiratory failure: CXR with pulmonary edema with BP > 200.  Improved with diuresis and BP control.  VQ low risk for PE, LE duplex  neg DVT.  TTE normal EF, grade 2 DD.    **AKI on CKD:  Quite labile renal function lately but near recent baseline in the mid 2s.  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 Renal US CKD recently and bladder scan ok here.   Cr was up from 2.8 at presentation to 4 yesterday but improved to 3.4--> 3.3.  AKI appears to be hemodynamically mediated with later swings in BP.  Dr Johnney Ou had a discussion with her son 3/30 about where things stand - manage medically but not advance to dialysis if develops renal failure; son in agreement.  Have been dosing diuretics PRN at this time - agree with d/c on Lasix 20 PO daily.  **HTN urgency: SBP > 200 on presentation.  Normotensive off meds now.  **Anemia:  Hb 9.3 down to 7.1 and improved to 10.4 >  11. after transfusion. no obvious bleeding. Heparin gtt and ASA held.    **Dementia: on namenda   NO plans for dialysis per Dr Caprice Red discussion with son on 3/30.  Per notes primary has d/w son -- if continued clinical decline plan for comfort care which is very reasonable.  Has f/u with Dr. Joelyn Oms at [facility] office 12/20/20 -- would keep this appt.  Madelon Lips MD 12/16/2020, 11:07 AM  [facility] Pager: [phone]

## 2020-12-16 NOTE — Discharge Instructions (Signed)
Disposition.  Residential hospice °Condition.  Guarded °CODE STATUS.  DNR °Activity.  With assistance as tolerated, full fall precautions. °Diet.  Soft with feeding assistance and aspiration precautions. °Goal of care.  Comfort. ° °

## 2020-12-16 NOTE — Progress Notes (Signed)
D/C instructions given and reviewed with son and daughter-in-law per pt's request.

## 2020-12-16 NOTE — Progress Notes (Signed)
Occupational Therapy Treatment Patient Details Name: Melinda Olson MRN: [id number] DOB: August 17, 1946 Today's Date: 12/16/2020    History of present illness Pt is a 75 y.o. female admitted 12/11/20 with SOB. Workup for acute hypoxic respiratory failure due to acute on chronic HF, AKI on CKD. 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   OT comments  Pt. Is doing well with following directions today. Pt. Preformed ADLs while sitting in recliner chair. Pt. Was able to preform UE bathing at MIn A level. Pt. Was Min A with donning and doffing of gown with cues to initiate and complete tasks. Pt. Is Min A with grooming tasks in sitting. Pt. Is dc home today and pt. States she feels ready to go. Acute OT to follow as long as pt. Is in the hospital.   Follow Up Recommendations  No OT follow up (Pt. is dc home wtih hospice)    Equipment Recommendations  None recommended by OT    Recommendations for Other Services      Precautions / Restrictions Precautions Precautions: Fall;Other (comment) Precaution Comments: Bladder/bowel incontinence; flexed posture Restrictions Weight Bearing Restrictions: No       Mobility Bed Mobility Overal bed mobility: Needs Assistance Bed Mobility: Supine to Sit     Supine to sit: Mod assist;Min assist     General bed mobility comments: ModA to assist BLEs to EOB, minA for trunk elevation; LOB back into sidelying requiring minA for trunk elevation; hand over hand assist to place BUEs at side on bed to maintain balance    Transfers Overall transfer level: Needs assistance Equipment used: 1 person hand held assist Transfers: Stand Pivot Transfers;Sit to/from Stand Sit to Stand: Mod assist Stand pivot transfers: Mod assist       General transfer comment: Initial stand pivot transfer from bed to recliner with modA for trunk elevation and maintaining upright, pt with difficulty  taking steps; additional stand from recliner with modA    Balance Overall balance assessment: Needs assistance Sitting-balance support: No upper extremity supported;Feet supported;Bilateral upper extremity supported Sitting balance-Leahy Scale: Poor Sitting balance - Comments: Reliant on UE support to maintain seated balance   Standing balance support: Single extremity supported Standing balance-Leahy Scale: Poor Standing balance comment: Reliant on UE support and external assist; dependent for posterior hygiene                           ADL either performed or assessed with clinical judgement   ADL Overall ADL's : Needs assistance/impaired     Grooming: Wash/dry hands;Wash/dry face;Oral care;Brushing hair;Minimal assistance;Sitting (Pt. needed assist for full coverage when brushing hair. Pt. needed assist to place toothpaste on brush)   Upper Body Bathing: Minimal assistance;Sitting       Upper Body Dressing : Minimal assistance;Sitting                   Functional mobility during ADLs: Moderate assistance (Mod A with sit to stand.) General ADL Comments: Pt. needed cues to preform ADLs tasks.     Vision   Vision Assessment?: No apparent visual deficits   Perception     Praxis      Cognition Arousal/Alertness: Awake/alert Behavior During Therapy: WFL for tasks assessed/performed Overall Cognitive Status: History of cognitive impairments - at baseline  General Comments: Pt. is following 1 step directions        Exercises     Shoulder Instructions       General Comments Pt with bladder/bowel incontinence, dependent for pericare and linen change; required assist for meal set-up then able to feed self    Pertinent Vitals/ Pain       Pain Assessment: No/denies pain  Home Living                                          Prior Functioning/Environment              Frequency   Min 2X/week        Progress Toward Goals  OT Goals(current goals can now be found in the care plan section)  Progress towards OT goals: Progressing toward goals  Acute Rehab OT Goals Patient Stated Goal: go home today OT Goal Formulation: Patient unable to participate in goal setting Time For Goal Achievement: 12/26/20 Potential to Achieve Goals: Fair ADL Goals Pt Will Perform Grooming: with supervision;sitting Pt Will Perform Upper Body Bathing: with supervision;sitting Pt Will Perform Upper Body Dressing: with supervision;sitting Pt Will Transfer to Toilet: stand pivot transfer;bedside commode;with min guard assist Pt Will Perform Toileting - Clothing Manipulation and hygiene: sit to/from stand;with min guard assist  Plan Discharge plan remains appropriate    Co-evaluation                 AM-PAC OT "6 Clicks" Daily Activity     Outcome Measure   Help from another person eating meals?: A Little Help from another person taking care of personal grooming?: A Little Help from another person toileting, which includes using toliet, bedpan, or urinal?: A Lot Help from another person bathing (including washing, rinsing, drying)?: A Lot Help from another person to put on and taking off regular upper body clothing?: A Lot Help from another person to put on and taking off regular lower body clothing?: A Lot 6 Click Score: 14    End of Session    OT Visit Diagnosis: Unsteadiness on feet (R26.81);Muscle weakness (generalized) (M62.81);Other symptoms and signs involving cognitive function;Dizziness and giddiness (R42)   Activity Tolerance Patient tolerated treatment well   Patient Left in chair;with call bell/phone within reach;with chair alarm set   Nurse Communication  (ok therapy)        Time: [id number] OT Time Calculation (min): 25 min  Charges: OT General Charges $OT Visit: 1 Visit OT Treatments $Self Care/Home Management : 23-37 mins  Reece Packer  OT/L    Neng Albee 12/16/2020, 9:30 AM

## 2021-01-02 ENCOUNTER — Ambulatory Visit: Payer: Medicare Other | Admitting: Neurology

## 2021-01-16 ENCOUNTER — Ambulatory Visit: Payer: Medicare Other | Admitting: Neurology

## 2022-02-12 DEATH — deceased
